# Patient Record
Sex: Male | Born: 1970 | Race: White | Hispanic: No | Marital: Married | Smoking: Current every day smoker
Health system: Southern US, Community
[De-identification: ages and names within clinical notes are randomized; demographics above are authoritative.]

## PROBLEM LIST (undated history)

## (undated) DIAGNOSIS — E785 Hyperlipidemia, unspecified: Secondary | ICD-10-CM

## (undated) DIAGNOSIS — G8929 Other chronic pain: Secondary | ICD-10-CM

## (undated) DIAGNOSIS — M1990 Unspecified osteoarthritis, unspecified site: Secondary | ICD-10-CM

## (undated) DIAGNOSIS — I4892 Unspecified atrial flutter: Secondary | ICD-10-CM

## (undated) DIAGNOSIS — Z720 Tobacco use: Secondary | ICD-10-CM

## (undated) DIAGNOSIS — D8685 Sarcoid myocarditis: Secondary | ICD-10-CM

## (undated) DIAGNOSIS — Z950 Presence of cardiac pacemaker: Secondary | ICD-10-CM

## (undated) DIAGNOSIS — E669 Obesity, unspecified: Secondary | ICD-10-CM

## (undated) DIAGNOSIS — I10 Essential (primary) hypertension: Secondary | ICD-10-CM

## (undated) DIAGNOSIS — G4733 Obstructive sleep apnea (adult) (pediatric): Secondary | ICD-10-CM

## (undated) DIAGNOSIS — I872 Venous insufficiency (chronic) (peripheral): Secondary | ICD-10-CM

## (undated) DIAGNOSIS — F1010 Alcohol abuse, uncomplicated: Secondary | ICD-10-CM

## (undated) DIAGNOSIS — G629 Polyneuropathy, unspecified: Secondary | ICD-10-CM

## (undated) DIAGNOSIS — D6859 Other primary thrombophilia: Secondary | ICD-10-CM

## (undated) DIAGNOSIS — I499 Cardiac arrhythmia, unspecified: Secondary | ICD-10-CM

## (undated) DIAGNOSIS — E119 Type 2 diabetes mellitus without complications: Secondary | ICD-10-CM

## (undated) HISTORY — DX: Sarcoid myocarditis: D86.85

## (undated) HISTORY — DX: Hyperlipidemia, unspecified: E78.5

## (undated) HISTORY — DX: Obstructive sleep apnea (adult) (pediatric): G47.33

## (undated) HISTORY — DX: Unspecified osteoarthritis, unspecified site: M19.90

## (undated) HISTORY — DX: Tobacco use: Z72.0

## (undated) HISTORY — DX: Obesity, unspecified: E66.9

## (undated) HISTORY — DX: Polyneuropathy, unspecified: G62.9

## (undated) HISTORY — PX: CARPAL TUNNEL RELEASE: SHX101

## (undated) HISTORY — DX: Other chronic pain: G89.29

## (undated) HISTORY — DX: Type 2 diabetes mellitus without complications: E11.9

## (undated) HISTORY — DX: Venous insufficiency (chronic) (peripheral): I87.2

## (undated) HISTORY — DX: Unspecified atrial flutter: I48.92

## (undated) HISTORY — DX: Alcohol abuse, uncomplicated: F10.10

---

## 2005-07-29 ENCOUNTER — Ambulatory Visit (HOSPITAL_BASED_OUTPATIENT_CLINIC_OR_DEPARTMENT_OTHER): Admission: RE | Admit: 2005-07-29 | Discharge: 2005-07-29 | Payer: Self-pay | Admitting: Orthopedic Surgery

## 2007-04-11 ENCOUNTER — Ambulatory Visit (HOSPITAL_COMMUNITY): Admission: RE | Admit: 2007-04-11 | Discharge: 2007-04-11 | Payer: Self-pay | Admitting: Orthopedic Surgery

## 2010-10-31 NOTE — Op Note (Signed)
NAMEFARRON, Christopher Green NO.:  [id number]   MEDICAL RECORD NO.:  [id number]          PATIENT TYPE:  AMB   LOCATION:  DSC                          FACILITY:  [facility]   PHYSICIAN:  Cindee Salt, M.D.       DATE OF BIRTH:  1970/10/23   DATE OF PROCEDURE:  07/29/2005  DATE OF DISCHARGE:                                 OPERATIVE REPORT   Please see snapping no Maxamilian Amadon feel any [facility] [phone]   PREOPERATIVE DIAGNOSIS:  Carpal tunnel syndrome left hand.   POSTOPERATIVE DIAGNOSIS:  Carpal tunnel syndrome left hand.   OPERATION:  Decompression left median nerve.   SURGEON:  Cindee Salt, M.D.   ASSISTANT:  Joaquin Courts R.N.   ANESTHESIA:  Forearm based IV regional.   HISTORY:  The patient is a 40 year old male with a history of carpal tunnel  syndrome. EMG nerve conduction is positive which has not responded to  conservative treatment.   PROCEDURE:  The patient was brought to the operating room where a forearm  based IV regional anesthetic was carried out without difficulty; was prepped  using DuraPrep, supine position, left arm free. A longitudinal incision was  made and carried down through subcutaneous tissue. Bleeders were  electrocauterized. Palmar fascia was split. Superficial palmar arch  identified. Flexor tendon of the ring and little finger identified to the  ulnar side of the median nerve  The carpal retinaculum was incised with  sharp dissection. Right-angle and Sewall retractor were placed between skin  and forearm fascia. The fascia was released for approximately 1.5 cm  proximal to the wrist crease under direct vision. Palmar fascia was markedly  thickened. Tenosynovial tissue was also thickened. Area of deformity to the  nerve was apparent with an area of hyperemia. No further lesions were  identified. The wound was irrigated. The skin was closed with interrupted 5-  0 nylon sutures. Sterile compressive dressing and splint was applied. The  patient tolerated the procedure well, and was taken to the recovery room for  observation in satisfactory condition. He was discharged home to return to  the [facility] [place] in 1 week on Vicodin.           ______________________________  Cindee Salt, M.D.     GK/MEDQ  D:  07/29/2005  T:  07/29/2005  Job:  [id number]

## 2017-04-20 DIAGNOSIS — I10 Essential (primary) hypertension: Secondary | ICD-10-CM | POA: Diagnosis present

## 2017-06-15 HISTORY — PX: CARDIAC CATHETERIZATION: SHX172

## 2018-02-13 DIAGNOSIS — R748 Abnormal levels of other serum enzymes: Secondary | ICD-10-CM | POA: Diagnosis not present

## 2018-02-13 DIAGNOSIS — I10 Essential (primary) hypertension: Secondary | ICD-10-CM

## 2018-02-14 DIAGNOSIS — R079 Chest pain, unspecified: Secondary | ICD-10-CM

## 2018-02-14 DIAGNOSIS — I10 Essential (primary) hypertension: Secondary | ICD-10-CM | POA: Diagnosis not present

## 2018-02-14 DIAGNOSIS — R748 Abnormal levels of other serum enzymes: Secondary | ICD-10-CM | POA: Diagnosis not present

## 2018-02-15 DIAGNOSIS — F419 Anxiety disorder, unspecified: Secondary | ICD-10-CM | POA: Diagnosis not present

## 2018-02-15 DIAGNOSIS — R748 Abnormal levels of other serum enzymes: Secondary | ICD-10-CM | POA: Diagnosis not present

## 2018-02-15 DIAGNOSIS — I10 Essential (primary) hypertension: Secondary | ICD-10-CM | POA: Diagnosis not present

## 2018-02-28 DIAGNOSIS — Z720 Tobacco use: Secondary | ICD-10-CM | POA: Diagnosis present

## 2018-03-25 HISTORY — PX: CARDIAC CATHETERIZATION: SHX172

## 2019-11-15 ENCOUNTER — Other Ambulatory Visit: Payer: Self-pay | Admitting: Orthopaedic Surgery

## 2019-11-15 DIAGNOSIS — M4716 Other spondylosis with myelopathy, lumbar region: Secondary | ICD-10-CM

## 2019-12-19 ENCOUNTER — Ambulatory Visit
Admission: RE | Admit: 2019-12-19 | Discharge: 2019-12-19 | Disposition: A | Discharge status: Home / Self Care | Payer: BC Managed Care – PPO | Source: Ambulatory Visit | Attending: Orthopaedic Surgery | Admitting: Orthopaedic Surgery

## 2019-12-19 DIAGNOSIS — M4716 Other spondylosis with myelopathy, lumbar region: Secondary | ICD-10-CM

## 2021-04-23 DIAGNOSIS — R200 Anesthesia of skin: Secondary | ICD-10-CM | POA: Diagnosis not present

## 2021-04-23 DIAGNOSIS — M19041 Primary osteoarthritis, right hand: Secondary | ICD-10-CM | POA: Diagnosis not present

## 2021-04-23 DIAGNOSIS — R202 Paresthesia of skin: Secondary | ICD-10-CM | POA: Diagnosis not present

## 2021-04-30 DIAGNOSIS — M4807 Spinal stenosis, lumbosacral region: Secondary | ICD-10-CM | POA: Diagnosis not present

## 2021-04-30 DIAGNOSIS — G5613 Other lesions of median nerve, bilateral upper limbs: Secondary | ICD-10-CM | POA: Diagnosis not present

## 2021-04-30 DIAGNOSIS — F17210 Nicotine dependence, cigarettes, uncomplicated: Secondary | ICD-10-CM | POA: Diagnosis not present

## 2021-04-30 DIAGNOSIS — R0602 Shortness of breath: Secondary | ICD-10-CM | POA: Diagnosis not present

## 2021-04-30 DIAGNOSIS — E291 Testicular hypofunction: Secondary | ICD-10-CM | POA: Diagnosis not present

## 2021-04-30 DIAGNOSIS — M542 Cervicalgia: Secondary | ICD-10-CM | POA: Diagnosis not present

## 2021-04-30 DIAGNOSIS — F17200 Nicotine dependence, unspecified, uncomplicated: Secondary | ICD-10-CM | POA: Diagnosis not present

## 2021-04-30 DIAGNOSIS — G8929 Other chronic pain: Secondary | ICD-10-CM | POA: Diagnosis not present

## 2021-04-30 DIAGNOSIS — M5412 Radiculopathy, cervical region: Secondary | ICD-10-CM | POA: Diagnosis not present

## 2021-04-30 DIAGNOSIS — M5137 Other intervertebral disc degeneration, lumbosacral region: Secondary | ICD-10-CM | POA: Diagnosis not present

## 2021-04-30 DIAGNOSIS — I10 Essential (primary) hypertension: Secondary | ICD-10-CM | POA: Diagnosis not present

## 2021-05-16 DIAGNOSIS — M546 Pain in thoracic spine: Secondary | ICD-10-CM | POA: Diagnosis not present

## 2021-05-16 DIAGNOSIS — M545 Low back pain, unspecified: Secondary | ICD-10-CM | POA: Diagnosis not present

## 2021-05-16 DIAGNOSIS — M79672 Pain in left foot: Secondary | ICD-10-CM | POA: Diagnosis not present

## 2021-05-16 DIAGNOSIS — F17210 Nicotine dependence, cigarettes, uncomplicated: Secondary | ICD-10-CM | POA: Diagnosis not present

## 2021-05-16 DIAGNOSIS — M542 Cervicalgia: Secondary | ICD-10-CM | POA: Diagnosis not present

## 2021-05-16 DIAGNOSIS — M79671 Pain in right foot: Secondary | ICD-10-CM | POA: Diagnosis not present

## 2021-05-16 DIAGNOSIS — G8929 Other chronic pain: Secondary | ICD-10-CM | POA: Diagnosis not present

## 2021-05-16 DIAGNOSIS — Z79899 Other long term (current) drug therapy: Secondary | ICD-10-CM | POA: Diagnosis not present

## 2021-05-16 DIAGNOSIS — M129 Arthropathy, unspecified: Secondary | ICD-10-CM | POA: Diagnosis not present

## 2021-05-16 DIAGNOSIS — R5383 Other fatigue: Secondary | ICD-10-CM | POA: Diagnosis not present

## 2021-05-16 DIAGNOSIS — E1165 Type 2 diabetes mellitus with hyperglycemia: Secondary | ICD-10-CM | POA: Diagnosis not present

## 2021-05-20 DIAGNOSIS — Z79899 Other long term (current) drug therapy: Secondary | ICD-10-CM | POA: Diagnosis not present

## 2021-05-28 ENCOUNTER — Other Ambulatory Visit: Payer: Self-pay

## 2021-05-28 ENCOUNTER — Ambulatory Visit (INDEPENDENT_AMBULATORY_CARE_PROVIDER_SITE_OTHER): Payer: BC Managed Care – PPO

## 2021-05-28 ENCOUNTER — Ambulatory Visit (INDEPENDENT_AMBULATORY_CARE_PROVIDER_SITE_OTHER): Payer: BC Managed Care – PPO | Admitting: Podiatry

## 2021-05-28 DIAGNOSIS — G629 Polyneuropathy, unspecified: Secondary | ICD-10-CM | POA: Diagnosis not present

## 2021-05-28 DIAGNOSIS — M779 Enthesopathy, unspecified: Secondary | ICD-10-CM

## 2021-05-28 MED ORDER — GABAPENTIN 400 MG PO CAPS: 400.0000 mg | ORAL_CAPSULE | Freq: Three times a day (TID) | ORAL | 3 refills | Status: DC

## 2021-05-29 NOTE — Progress Notes (Signed)
Subjective:   Patient ID: Christopher Green, male   DOB: 50 y.o.   MRN: [id number]   HPI Patient presents stating he has a lot of generalized pain in both of his feet and cannot make a complete determination stating its been going on for a long time and also has back issues and is due to see a surgeon for nerve compressions and also neck.  Patient does not smoke moderately obese and does work 12 to 14 hours a day standing   Review of Systems  All other systems reviewed and are negative.      Objective:  Physical Exam Vitals and nursing note reviewed.  Constitutional:      Appearance: He is well-developed.  Pulmonary:     Effort: Pulmonary effort is normal.  Musculoskeletal:        General: Normal range of motion.  Skin:    General: Skin is warm.  Neurological:     Mental Status: He is alert.    Vascular status was intact with patient found to have moderate diminishment sharp dull vibratory.  Range of motion present muscle strength adequate with patient not having any specific areas on his feet that I was able to identify pain but states they simply just are painful with walking and at nighttime.  He also has severe back issues and is seeking care currently for this     Assessment:  Probability that his back is a big part of the problem that he is experiencing with obesity is also a complicating factor in his having to be on his feet for such extended hours     Plan:  H&P x-rays reviewed conditions discussed at great length.  At this point I do think that we should try to treat the probable neuropathic element of his condition and we will get a start him on gabapentin 1 at night and if he tolerates it well he can add 1 morning midday.  I do not expect a miracle cures for him but I am hoping this will help and if he cannot tolerate it he is not to keep taking the medicine.  He is due to see a neurosurgeon which I think to be of benefit but I do not know whether or not this will cure his  foot pain but there is no organic issue that I saw at the current time  X-rays bilateral were negative for signs of arthritis negative for signs of fracture or acute condition associated with the pain he experiences in both feet

## 2021-06-12 DIAGNOSIS — F17210 Nicotine dependence, cigarettes, uncomplicated: Secondary | ICD-10-CM | POA: Diagnosis not present

## 2021-06-12 DIAGNOSIS — B9689 Other specified bacterial agents as the cause of diseases classified elsewhere: Secondary | ICD-10-CM | POA: Diagnosis not present

## 2021-06-12 DIAGNOSIS — J329 Chronic sinusitis, unspecified: Secondary | ICD-10-CM | POA: Diagnosis not present

## 2021-06-12 DIAGNOSIS — M542 Cervicalgia: Secondary | ICD-10-CM | POA: Diagnosis not present

## 2021-06-12 DIAGNOSIS — G8929 Other chronic pain: Secondary | ICD-10-CM | POA: Diagnosis not present

## 2021-06-12 DIAGNOSIS — Z79899 Other long term (current) drug therapy: Secondary | ICD-10-CM | POA: Diagnosis not present

## 2021-06-17 DIAGNOSIS — Z79899 Other long term (current) drug therapy: Secondary | ICD-10-CM | POA: Diagnosis not present

## 2021-07-11 DIAGNOSIS — Z6835 Body mass index (BMI) 35.0-35.9, adult: Secondary | ICD-10-CM | POA: Diagnosis not present

## 2021-07-11 DIAGNOSIS — E1165 Type 2 diabetes mellitus with hyperglycemia: Secondary | ICD-10-CM | POA: Diagnosis not present

## 2021-07-11 DIAGNOSIS — G8929 Other chronic pain: Secondary | ICD-10-CM | POA: Diagnosis not present

## 2021-07-11 DIAGNOSIS — Z0000 Encounter for general adult medical examination without abnormal findings: Secondary | ICD-10-CM | POA: Diagnosis not present

## 2021-07-11 DIAGNOSIS — Z79899 Other long term (current) drug therapy: Secondary | ICD-10-CM | POA: Diagnosis not present

## 2021-07-11 DIAGNOSIS — Z125 Encounter for screening for malignant neoplasm of prostate: Secondary | ICD-10-CM | POA: Diagnosis not present

## 2021-07-11 DIAGNOSIS — M542 Cervicalgia: Secondary | ICD-10-CM | POA: Diagnosis not present

## 2021-07-15 DIAGNOSIS — Z79899 Other long term (current) drug therapy: Secondary | ICD-10-CM | POA: Diagnosis not present

## 2021-08-08 DIAGNOSIS — M5136 Other intervertebral disc degeneration, lumbar region: Secondary | ICD-10-CM | POA: Diagnosis not present

## 2021-08-08 DIAGNOSIS — Z79899 Other long term (current) drug therapy: Secondary | ICD-10-CM | POA: Diagnosis not present

## 2021-08-08 DIAGNOSIS — T148XXA Other injury of unspecified body region, initial encounter: Secondary | ICD-10-CM | POA: Diagnosis not present

## 2021-08-08 DIAGNOSIS — E1165 Type 2 diabetes mellitus with hyperglycemia: Secondary | ICD-10-CM | POA: Diagnosis not present

## 2021-08-08 DIAGNOSIS — R030 Elevated blood-pressure reading, without diagnosis of hypertension: Secondary | ICD-10-CM | POA: Diagnosis not present

## 2021-08-12 DIAGNOSIS — M5412 Radiculopathy, cervical region: Secondary | ICD-10-CM | POA: Diagnosis not present

## 2021-08-12 DIAGNOSIS — M48062 Spinal stenosis, lumbar region with neurogenic claudication: Secondary | ICD-10-CM | POA: Diagnosis not present

## 2021-08-12 DIAGNOSIS — M542 Cervicalgia: Secondary | ICD-10-CM | POA: Diagnosis not present

## 2021-08-12 DIAGNOSIS — M5416 Radiculopathy, lumbar region: Secondary | ICD-10-CM | POA: Diagnosis not present

## 2021-08-13 ENCOUNTER — Other Ambulatory Visit: Payer: Self-pay | Admitting: Rehabilitation

## 2021-08-13 DIAGNOSIS — M5412 Radiculopathy, cervical region: Secondary | ICD-10-CM

## 2021-08-19 DIAGNOSIS — G8929 Other chronic pain: Secondary | ICD-10-CM | POA: Diagnosis not present

## 2021-08-19 DIAGNOSIS — E1165 Type 2 diabetes mellitus with hyperglycemia: Secondary | ICD-10-CM | POA: Diagnosis not present

## 2021-08-19 DIAGNOSIS — Z6835 Body mass index (BMI) 35.0-35.9, adult: Secondary | ICD-10-CM | POA: Diagnosis not present

## 2021-08-19 DIAGNOSIS — R030 Elevated blood-pressure reading, without diagnosis of hypertension: Secondary | ICD-10-CM | POA: Diagnosis not present

## 2021-08-19 DIAGNOSIS — M542 Cervicalgia: Secondary | ICD-10-CM | POA: Diagnosis not present

## 2021-08-24 ENCOUNTER — Inpatient Hospital Stay: Admission: RE | Admit: 2021-08-24 | Payer: BC Managed Care – PPO | Source: Ambulatory Visit

## 2021-08-26 ENCOUNTER — Ambulatory Visit
Admission: RE | Admit: 2021-08-26 | Discharge: 2021-08-26 | Disposition: A | Discharge status: Home / Self Care | Payer: BC Managed Care – PPO | Source: Ambulatory Visit | Attending: Rehabilitation | Admitting: Rehabilitation

## 2021-08-26 ENCOUNTER — Other Ambulatory Visit: Payer: Self-pay

## 2021-08-26 DIAGNOSIS — M5412 Radiculopathy, cervical region: Secondary | ICD-10-CM

## 2021-08-26 DIAGNOSIS — M2578 Osteophyte, vertebrae: Secondary | ICD-10-CM | POA: Diagnosis not present

## 2021-08-26 DIAGNOSIS — M4802 Spinal stenosis, cervical region: Secondary | ICD-10-CM | POA: Diagnosis not present

## 2021-08-27 DIAGNOSIS — M48062 Spinal stenosis, lumbar region with neurogenic claudication: Secondary | ICD-10-CM | POA: Diagnosis not present

## 2021-08-27 DIAGNOSIS — M5416 Radiculopathy, lumbar region: Secondary | ICD-10-CM | POA: Diagnosis not present

## 2021-08-27 DIAGNOSIS — M5412 Radiculopathy, cervical region: Secondary | ICD-10-CM | POA: Diagnosis not present

## 2021-09-01 ENCOUNTER — Other Ambulatory Visit: Payer: BC Managed Care – PPO

## 2021-09-03 DIAGNOSIS — M5412 Radiculopathy, cervical region: Secondary | ICD-10-CM | POA: Diagnosis not present

## 2021-09-05 DIAGNOSIS — Z79899 Other long term (current) drug therapy: Secondary | ICD-10-CM | POA: Diagnosis not present

## 2021-09-05 DIAGNOSIS — M542 Cervicalgia: Secondary | ICD-10-CM | POA: Diagnosis not present

## 2021-09-05 DIAGNOSIS — F40243 Fear of flying: Secondary | ICD-10-CM | POA: Diagnosis not present

## 2021-09-05 DIAGNOSIS — G8929 Other chronic pain: Secondary | ICD-10-CM | POA: Diagnosis not present

## 2021-09-05 DIAGNOSIS — E1165 Type 2 diabetes mellitus with hyperglycemia: Secondary | ICD-10-CM | POA: Diagnosis not present

## 2021-09-09 DIAGNOSIS — Z79899 Other long term (current) drug therapy: Secondary | ICD-10-CM | POA: Diagnosis not present

## 2021-10-03 DIAGNOSIS — M542 Cervicalgia: Secondary | ICD-10-CM | POA: Diagnosis not present

## 2021-10-03 DIAGNOSIS — G8929 Other chronic pain: Secondary | ICD-10-CM | POA: Diagnosis not present

## 2021-10-03 DIAGNOSIS — R030 Elevated blood-pressure reading, without diagnosis of hypertension: Secondary | ICD-10-CM | POA: Diagnosis not present

## 2021-10-03 DIAGNOSIS — Z6835 Body mass index (BMI) 35.0-35.9, adult: Secondary | ICD-10-CM | POA: Diagnosis not present

## 2021-10-03 DIAGNOSIS — Z79899 Other long term (current) drug therapy: Secondary | ICD-10-CM | POA: Diagnosis not present

## 2021-10-03 DIAGNOSIS — F419 Anxiety disorder, unspecified: Secondary | ICD-10-CM | POA: Diagnosis not present

## 2021-10-03 DIAGNOSIS — E1165 Type 2 diabetes mellitus with hyperglycemia: Secondary | ICD-10-CM | POA: Diagnosis not present

## 2021-10-06 DIAGNOSIS — Z79899 Other long term (current) drug therapy: Secondary | ICD-10-CM | POA: Diagnosis not present

## 2021-10-17 DIAGNOSIS — M542 Cervicalgia: Secondary | ICD-10-CM | POA: Diagnosis not present

## 2021-10-17 DIAGNOSIS — G8929 Other chronic pain: Secondary | ICD-10-CM | POA: Diagnosis not present

## 2021-10-17 DIAGNOSIS — R030 Elevated blood-pressure reading, without diagnosis of hypertension: Secondary | ICD-10-CM | POA: Diagnosis not present

## 2021-10-17 DIAGNOSIS — Z6835 Body mass index (BMI) 35.0-35.9, adult: Secondary | ICD-10-CM | POA: Diagnosis not present

## 2021-10-17 DIAGNOSIS — Z6837 Body mass index (BMI) 37.0-37.9, adult: Secondary | ICD-10-CM | POA: Diagnosis not present

## 2021-10-17 DIAGNOSIS — E1165 Type 2 diabetes mellitus with hyperglycemia: Secondary | ICD-10-CM | POA: Diagnosis not present

## 2021-10-17 DIAGNOSIS — F419 Anxiety disorder, unspecified: Secondary | ICD-10-CM | POA: Diagnosis not present

## 2021-10-17 DIAGNOSIS — Z79899 Other long term (current) drug therapy: Secondary | ICD-10-CM | POA: Diagnosis not present

## 2021-10-21 DIAGNOSIS — Z79899 Other long term (current) drug therapy: Secondary | ICD-10-CM | POA: Diagnosis not present

## 2021-10-31 DIAGNOSIS — M542 Cervicalgia: Secondary | ICD-10-CM | POA: Diagnosis not present

## 2021-10-31 DIAGNOSIS — E1165 Type 2 diabetes mellitus with hyperglycemia: Secondary | ICD-10-CM | POA: Diagnosis not present

## 2021-10-31 DIAGNOSIS — G8929 Other chronic pain: Secondary | ICD-10-CM | POA: Diagnosis not present

## 2021-10-31 DIAGNOSIS — Z6837 Body mass index (BMI) 37.0-37.9, adult: Secondary | ICD-10-CM | POA: Diagnosis not present

## 2021-10-31 DIAGNOSIS — Z6835 Body mass index (BMI) 35.0-35.9, adult: Secondary | ICD-10-CM | POA: Diagnosis not present

## 2021-10-31 DIAGNOSIS — F419 Anxiety disorder, unspecified: Secondary | ICD-10-CM | POA: Diagnosis not present

## 2021-10-31 DIAGNOSIS — R030 Elevated blood-pressure reading, without diagnosis of hypertension: Secondary | ICD-10-CM | POA: Diagnosis not present

## 2021-10-31 DIAGNOSIS — Z79899 Other long term (current) drug therapy: Secondary | ICD-10-CM | POA: Diagnosis not present

## 2021-11-05 DIAGNOSIS — Z79899 Other long term (current) drug therapy: Secondary | ICD-10-CM | POA: Diagnosis not present

## 2021-12-04 DIAGNOSIS — F419 Anxiety disorder, unspecified: Secondary | ICD-10-CM | POA: Diagnosis not present

## 2021-12-04 DIAGNOSIS — Z6837 Body mass index (BMI) 37.0-37.9, adult: Secondary | ICD-10-CM | POA: Diagnosis not present

## 2021-12-04 DIAGNOSIS — Z79899 Other long term (current) drug therapy: Secondary | ICD-10-CM | POA: Diagnosis not present

## 2021-12-04 DIAGNOSIS — R030 Elevated blood-pressure reading, without diagnosis of hypertension: Secondary | ICD-10-CM | POA: Diagnosis not present

## 2021-12-04 DIAGNOSIS — E1165 Type 2 diabetes mellitus with hyperglycemia: Secondary | ICD-10-CM | POA: Diagnosis not present

## 2021-12-04 DIAGNOSIS — M542 Cervicalgia: Secondary | ICD-10-CM | POA: Diagnosis not present

## 2021-12-04 DIAGNOSIS — G8929 Other chronic pain: Secondary | ICD-10-CM | POA: Diagnosis not present

## 2021-12-08 DIAGNOSIS — Z79899 Other long term (current) drug therapy: Secondary | ICD-10-CM | POA: Diagnosis not present

## 2022-01-01 DIAGNOSIS — F419 Anxiety disorder, unspecified: Secondary | ICD-10-CM | POA: Diagnosis not present

## 2022-01-01 DIAGNOSIS — E1165 Type 2 diabetes mellitus with hyperglycemia: Secondary | ICD-10-CM | POA: Diagnosis not present

## 2022-01-01 DIAGNOSIS — Z6836 Body mass index (BMI) 36.0-36.9, adult: Secondary | ICD-10-CM | POA: Diagnosis not present

## 2022-01-01 DIAGNOSIS — M542 Cervicalgia: Secondary | ICD-10-CM | POA: Diagnosis not present

## 2022-01-01 DIAGNOSIS — Z79899 Other long term (current) drug therapy: Secondary | ICD-10-CM | POA: Diagnosis not present

## 2022-01-01 DIAGNOSIS — R030 Elevated blood-pressure reading, without diagnosis of hypertension: Secondary | ICD-10-CM | POA: Diagnosis not present

## 2022-01-01 DIAGNOSIS — G8929 Other chronic pain: Secondary | ICD-10-CM | POA: Diagnosis not present

## 2022-01-05 DIAGNOSIS — Z79899 Other long term (current) drug therapy: Secondary | ICD-10-CM | POA: Diagnosis not present

## 2022-01-30 DIAGNOSIS — Z79899 Other long term (current) drug therapy: Secondary | ICD-10-CM | POA: Diagnosis not present

## 2022-01-30 DIAGNOSIS — G8929 Other chronic pain: Secondary | ICD-10-CM | POA: Diagnosis not present

## 2022-01-30 DIAGNOSIS — R030 Elevated blood-pressure reading, without diagnosis of hypertension: Secondary | ICD-10-CM | POA: Diagnosis not present

## 2022-01-30 DIAGNOSIS — F419 Anxiety disorder, unspecified: Secondary | ICD-10-CM | POA: Diagnosis not present

## 2022-01-30 DIAGNOSIS — Z6836 Body mass index (BMI) 36.0-36.9, adult: Secondary | ICD-10-CM | POA: Diagnosis not present

## 2022-01-30 DIAGNOSIS — E1165 Type 2 diabetes mellitus with hyperglycemia: Secondary | ICD-10-CM | POA: Diagnosis not present

## 2022-01-30 DIAGNOSIS — M542 Cervicalgia: Secondary | ICD-10-CM | POA: Diagnosis not present

## 2022-02-03 DIAGNOSIS — Z79899 Other long term (current) drug therapy: Secondary | ICD-10-CM | POA: Diagnosis not present

## 2022-03-02 DIAGNOSIS — Z79899 Other long term (current) drug therapy: Secondary | ICD-10-CM | POA: Diagnosis not present

## 2022-03-02 DIAGNOSIS — F419 Anxiety disorder, unspecified: Secondary | ICD-10-CM | POA: Diagnosis not present

## 2022-03-02 DIAGNOSIS — R030 Elevated blood-pressure reading, without diagnosis of hypertension: Secondary | ICD-10-CM | POA: Diagnosis not present

## 2022-03-02 DIAGNOSIS — G8929 Other chronic pain: Secondary | ICD-10-CM | POA: Diagnosis not present

## 2022-03-02 DIAGNOSIS — M542 Cervicalgia: Secondary | ICD-10-CM | POA: Diagnosis not present

## 2022-03-02 DIAGNOSIS — Z6837 Body mass index (BMI) 37.0-37.9, adult: Secondary | ICD-10-CM | POA: Diagnosis not present

## 2022-03-02 DIAGNOSIS — E1165 Type 2 diabetes mellitus with hyperglycemia: Secondary | ICD-10-CM | POA: Diagnosis not present

## 2022-03-05 DIAGNOSIS — Z79899 Other long term (current) drug therapy: Secondary | ICD-10-CM | POA: Diagnosis not present

## 2022-03-16 DIAGNOSIS — E291 Testicular hypofunction: Secondary | ICD-10-CM | POA: Diagnosis not present

## 2022-03-16 DIAGNOSIS — I10 Essential (primary) hypertension: Secondary | ICD-10-CM | POA: Diagnosis not present

## 2022-03-16 DIAGNOSIS — E1165 Type 2 diabetes mellitus with hyperglycemia: Secondary | ICD-10-CM | POA: Diagnosis not present

## 2022-03-16 DIAGNOSIS — Z6838 Body mass index (BMI) 38.0-38.9, adult: Secondary | ICD-10-CM | POA: Diagnosis not present

## 2022-03-16 DIAGNOSIS — Z0000 Encounter for general adult medical examination without abnormal findings: Secondary | ICD-10-CM | POA: Diagnosis not present

## 2022-03-16 DIAGNOSIS — J018 Other acute sinusitis: Secondary | ICD-10-CM | POA: Diagnosis not present

## 2022-04-03 DIAGNOSIS — M542 Cervicalgia: Secondary | ICD-10-CM | POA: Diagnosis not present

## 2022-04-03 DIAGNOSIS — G8929 Other chronic pain: Secondary | ICD-10-CM | POA: Diagnosis not present

## 2022-04-03 DIAGNOSIS — R7309 Other abnormal glucose: Secondary | ICD-10-CM | POA: Diagnosis not present

## 2022-04-03 DIAGNOSIS — E1165 Type 2 diabetes mellitus with hyperglycemia: Secondary | ICD-10-CM | POA: Diagnosis not present

## 2022-04-03 DIAGNOSIS — F419 Anxiety disorder, unspecified: Secondary | ICD-10-CM | POA: Diagnosis not present

## 2022-04-03 DIAGNOSIS — Z79899 Other long term (current) drug therapy: Secondary | ICD-10-CM | POA: Diagnosis not present

## 2022-04-03 DIAGNOSIS — Z6839 Body mass index (BMI) 39.0-39.9, adult: Secondary | ICD-10-CM | POA: Diagnosis not present

## 2022-04-03 DIAGNOSIS — R030 Elevated blood-pressure reading, without diagnosis of hypertension: Secondary | ICD-10-CM | POA: Diagnosis not present

## 2022-04-09 DIAGNOSIS — Z79899 Other long term (current) drug therapy: Secondary | ICD-10-CM | POA: Diagnosis not present

## 2022-05-01 DIAGNOSIS — Z79899 Other long term (current) drug therapy: Secondary | ICD-10-CM | POA: Diagnosis not present

## 2022-05-01 DIAGNOSIS — R030 Elevated blood-pressure reading, without diagnosis of hypertension: Secondary | ICD-10-CM | POA: Diagnosis not present

## 2022-05-01 DIAGNOSIS — F419 Anxiety disorder, unspecified: Secondary | ICD-10-CM | POA: Diagnosis not present

## 2022-05-01 DIAGNOSIS — G8929 Other chronic pain: Secondary | ICD-10-CM | POA: Diagnosis not present

## 2022-05-01 DIAGNOSIS — E1165 Type 2 diabetes mellitus with hyperglycemia: Secondary | ICD-10-CM | POA: Diagnosis not present

## 2022-05-01 DIAGNOSIS — M542 Cervicalgia: Secondary | ICD-10-CM | POA: Diagnosis not present

## 2022-05-01 DIAGNOSIS — Z6838 Body mass index (BMI) 38.0-38.9, adult: Secondary | ICD-10-CM | POA: Diagnosis not present

## 2022-05-08 DIAGNOSIS — Z79899 Other long term (current) drug therapy: Secondary | ICD-10-CM | POA: Diagnosis not present

## 2022-05-25 ENCOUNTER — Emergency Department (HOSPITAL_COMMUNITY): Payer: BC Managed Care – PPO

## 2022-05-25 ENCOUNTER — Inpatient Hospital Stay (HOSPITAL_COMMUNITY)
Admission: EM | Admit: 2022-05-25 | Discharge: 2022-05-28 | DRG: 176 | Disposition: A | Discharge status: Home / Self Care | Payer: BC Managed Care – PPO | Attending: Internal Medicine | Admitting: Internal Medicine

## 2022-05-25 ENCOUNTER — Other Ambulatory Visit: Payer: Self-pay

## 2022-05-25 ENCOUNTER — Encounter (HOSPITAL_COMMUNITY): Payer: Self-pay

## 2022-05-25 DIAGNOSIS — N400 Enlarged prostate without lower urinary tract symptoms: Secondary | ICD-10-CM | POA: Diagnosis not present

## 2022-05-25 DIAGNOSIS — I441 Atrioventricular block, second degree: Secondary | ICD-10-CM | POA: Diagnosis present

## 2022-05-25 DIAGNOSIS — E1165 Type 2 diabetes mellitus with hyperglycemia: Secondary | ICD-10-CM | POA: Diagnosis not present

## 2022-05-25 DIAGNOSIS — R079 Chest pain, unspecified: Secondary | ICD-10-CM | POA: Diagnosis not present

## 2022-05-25 DIAGNOSIS — K4090 Unilateral inguinal hernia, without obstruction or gangrene, not specified as recurrent: Secondary | ICD-10-CM | POA: Diagnosis not present

## 2022-05-25 DIAGNOSIS — I517 Cardiomegaly: Secondary | ICD-10-CM | POA: Diagnosis not present

## 2022-05-25 DIAGNOSIS — Z7984 Long term (current) use of oral hypoglycemic drugs: Secondary | ICD-10-CM | POA: Diagnosis not present

## 2022-05-25 DIAGNOSIS — I82442 Acute embolism and thrombosis of left tibial vein: Secondary | ICD-10-CM | POA: Diagnosis present

## 2022-05-25 DIAGNOSIS — I2510 Atherosclerotic heart disease of native coronary artery without angina pectoris: Secondary | ICD-10-CM | POA: Diagnosis present

## 2022-05-25 DIAGNOSIS — I161 Hypertensive emergency: Secondary | ICD-10-CM | POA: Diagnosis not present

## 2022-05-25 DIAGNOSIS — I82452 Acute embolism and thrombosis of left peroneal vein: Secondary | ICD-10-CM | POA: Diagnosis not present

## 2022-05-25 DIAGNOSIS — E876 Hypokalemia: Secondary | ICD-10-CM | POA: Diagnosis not present

## 2022-05-25 DIAGNOSIS — E785 Hyperlipidemia, unspecified: Secondary | ICD-10-CM | POA: Diagnosis not present

## 2022-05-25 DIAGNOSIS — Z8249 Family history of ischemic heart disease and other diseases of the circulatory system: Secondary | ICD-10-CM

## 2022-05-25 DIAGNOSIS — Z79899 Other long term (current) drug therapy: Secondary | ICD-10-CM

## 2022-05-25 DIAGNOSIS — R0602 Shortness of breath: Secondary | ICD-10-CM | POA: Diagnosis not present

## 2022-05-25 DIAGNOSIS — F17210 Nicotine dependence, cigarettes, uncomplicated: Secondary | ICD-10-CM | POA: Diagnosis present

## 2022-05-25 DIAGNOSIS — Z20822 Contact with and (suspected) exposure to covid-19: Secondary | ICD-10-CM | POA: Diagnosis present

## 2022-05-25 DIAGNOSIS — I700 Atherosclerosis of aorta: Secondary | ICD-10-CM | POA: Diagnosis not present

## 2022-05-25 DIAGNOSIS — I2694 Multiple subsegmental pulmonary emboli without acute cor pulmonale: Secondary | ICD-10-CM | POA: Diagnosis not present

## 2022-05-25 DIAGNOSIS — R778 Other specified abnormalities of plasma proteins: Secondary | ICD-10-CM | POA: Diagnosis not present

## 2022-05-25 DIAGNOSIS — I2699 Other pulmonary embolism without acute cor pulmonale: Secondary | ICD-10-CM | POA: Diagnosis present

## 2022-05-25 DIAGNOSIS — I3139 Other pericardial effusion (noninflammatory): Secondary | ICD-10-CM | POA: Diagnosis present

## 2022-05-25 DIAGNOSIS — I160 Hypertensive urgency: Secondary | ICD-10-CM | POA: Diagnosis present

## 2022-05-25 DIAGNOSIS — I10 Essential (primary) hypertension: Secondary | ICD-10-CM | POA: Diagnosis not present

## 2022-05-25 DIAGNOSIS — Z91148 Patient's other noncompliance with medication regimen for other reason: Secondary | ICD-10-CM | POA: Diagnosis not present

## 2022-05-25 DIAGNOSIS — I119 Hypertensive heart disease without heart failure: Secondary | ICD-10-CM | POA: Diagnosis not present

## 2022-05-25 DIAGNOSIS — Z720 Tobacco use: Secondary | ICD-10-CM | POA: Diagnosis present

## 2022-05-25 DIAGNOSIS — R7989 Other specified abnormal findings of blood chemistry: Secondary | ICD-10-CM

## 2022-05-25 HISTORY — DX: Essential (primary) hypertension: I10

## 2022-05-25 HISTORY — DX: Other pulmonary embolism without acute cor pulmonale: I26.99

## 2022-05-25 LAB — TROPONIN I (HIGH SENSITIVITY)
Troponin I (High Sensitivity): 61 ng/L — ABNORMAL HIGH (ref <18)
Troponin I (High Sensitivity): 82 ng/L — ABNORMAL HIGH (ref <18)

## 2022-05-25 LAB — CBC
HCT: 51.3 % (ref 39.0–52.0)
Hemoglobin: 16.3 g/dL (ref 13.0–17.0)
MCH: 30.4 pg (ref 26.0–34.0)
MCHC: 31.8 g/dL (ref 30.0–36.0)
MCV: 95.5 fL (ref 80.0–100.0)
Platelets: 242 10*3/uL (ref 150–400)
RBC: 5.37 MIL/uL (ref 4.22–5.81)
RDW: 14.3 % (ref 11.5–15.5)
WBC: 7.2 10*3/uL (ref 4.0–10.5)
nRBC: 0 % (ref 0.0–0.2)

## 2022-05-25 LAB — PROTIME-INR
INR: 1.1 (ref 0.8–1.2)
Prothrombin Time: 14.1 seconds (ref 11.4–15.2)

## 2022-05-25 LAB — BASIC METABOLIC PANEL
Anion gap: 13 (ref 5–15)
BUN: 11 mg/dL (ref 6–20)
CO2: 28 mmol/L (ref 22–32)
Calcium: 9.2 mg/dL (ref 8.9–10.3)
Chloride: 102 mmol/L (ref 98–111)
Creatinine, Ser: 0.9 mg/dL (ref 0.61–1.24)
GFR, Estimated: >60 mL/min (ref >60)
Glucose, Bld: 143 mg/dL — ABNORMAL HIGH (ref 70–99)
Potassium: 3.2 mmol/L — ABNORMAL LOW (ref 3.5–5.1)
Sodium: 143 mmol/L (ref 135–145)

## 2022-05-25 LAB — RESP PANEL BY RT-PCR (RSV, FLU A&B, COVID)  RVPGX2
Influenza A by PCR: NEGATIVE
Influenza B by PCR: NEGATIVE
Resp Syncytial Virus by PCR: NEGATIVE
SARS Coronavirus 2 by RT PCR: NEGATIVE

## 2022-05-25 LAB — APTT: aPTT: 27 seconds (ref 24–36)

## 2022-05-25 LAB — BRAIN NATRIURETIC PEPTIDE: B Natriuretic Peptide: 79.2 pg/mL (ref 0.0–100.0)

## 2022-05-25 LAB — MAGNESIUM: Magnesium: 1.6 mg/dL — ABNORMAL LOW (ref 1.7–2.4)

## 2022-05-25 MED ORDER — ONDANSETRON HCL 4 MG PO TABS: 4.0000 mg | ORAL_TABLET | Freq: Four times a day (QID) | ORAL | Status: DC | PRN

## 2022-05-25 MED ORDER — OXYCODONE HCL 5 MG PO TABS
5.0000 mg | ORAL_TABLET | ORAL | Status: DC | PRN
  Administered 2022-05-25: 5 mg via ORAL
  Filled 2022-05-25: qty 1

## 2022-05-25 MED ORDER — POTASSIUM CHLORIDE 20 MEQ PO PACK
80.0000 meq | PACK | Freq: Once | ORAL | Status: AC
  Administered 2022-05-25: 80 meq via ORAL
  Filled 2022-05-25: qty 4

## 2022-05-25 MED ORDER — ONDANSETRON HCL 4 MG/2ML IJ SOLN: 4.0000 mg | Freq: Four times a day (QID) | INTRAMUSCULAR | Status: DC | PRN

## 2022-05-25 MED ORDER — SODIUM CHLORIDE 0.9% FLUSH
3.0000 mL | Freq: Two times a day (BID) | INTRAVENOUS | Status: DC
  Administered 2022-05-26 – 2022-05-28 (×5): 3 mL via INTRAVENOUS

## 2022-05-25 MED ORDER — HYDRALAZINE HCL 20 MG/ML IJ SOLN
20.0000 mg | Freq: Once | INTRAMUSCULAR | Status: AC
  Administered 2022-05-25: 20 mg via INTRAVENOUS
  Filled 2022-05-25: qty 1

## 2022-05-25 MED ORDER — IOHEXOL 350 MG/ML SOLN
100.0000 mL | Freq: Once | INTRAVENOUS | Status: AC | PRN
  Administered 2022-05-25: 100 mL via INTRAVENOUS

## 2022-05-25 MED ORDER — HEPARIN (PORCINE) 25000 UT/250ML-% IV SOLN
2200.0000 [IU]/h | INTRAVENOUS | Status: AC
  Administered 2022-05-25: 1900 [IU]/h via INTRAVENOUS
  Administered 2022-05-26: 2200 [IU]/h via INTRAVENOUS
  Filled 2022-05-25 (×2): qty 250

## 2022-05-25 MED ORDER — SENNOSIDES-DOCUSATE SODIUM 8.6-50 MG PO TABS: 1.0000 | ORAL_TABLET | Freq: Every evening | ORAL | Status: DC | PRN

## 2022-05-25 MED ORDER — POTASSIUM CHLORIDE CRYS ER 20 MEQ PO TBCR: 40.0000 meq | EXTENDED_RELEASE_TABLET | Freq: Once | ORAL | Status: DC

## 2022-05-25 MED ORDER — HYDRALAZINE HCL 20 MG/ML IJ SOLN
20.0000 mg | INTRAMUSCULAR | Status: DC | PRN
  Administered 2022-05-25 – 2022-05-28 (×4): 20 mg via INTRAVENOUS
  Filled 2022-05-25 (×4): qty 1

## 2022-05-25 MED ORDER — MAGNESIUM SULFATE 2 GM/50ML IV SOLN
2.0000 g | Freq: Once | INTRAVENOUS | Status: AC
  Administered 2022-05-25: 2 g via INTRAVENOUS
  Filled 2022-05-25: qty 50

## 2022-05-25 MED ORDER — ACETAMINOPHEN 325 MG PO TABS
650.0000 mg | ORAL_TABLET | Freq: Four times a day (QID) | ORAL | Status: DC | PRN
  Administered 2022-05-26 – 2022-05-28 (×8): 650 mg via ORAL
  Filled 2022-05-25 (×8): qty 2

## 2022-05-25 MED ORDER — ACETAMINOPHEN 650 MG RE SUPP: 650.0000 mg | Freq: Four times a day (QID) | RECTAL | Status: DC | PRN

## 2022-05-25 MED ORDER — HEPARIN BOLUS VIA INFUSION
3000.0000 [IU] | Freq: Once | INTRAVENOUS | Status: AC
  Administered 2022-05-25: 3000 [IU] via INTRAVENOUS
  Filled 2022-05-25: qty 3000

## 2022-05-25 NOTE — Hospital Course (Signed)
Christopher Green is a 51 y.o. male with medical history significant for HTN, HLD, tobacco use who is admitted with acute PE found to have second-degree AV block.  Started on IV heparin and cardiology to consult.

## 2022-05-25 NOTE — Assessment & Plan Note (Signed)
CTA shows acute segmental and subsegmental PE in the LLL and RUL.  No CT evidence of right heart strain. Saturating well on room air. -Continue IV heparin -Keep on telemetry -Obtain echocardiogram -Supplemental O2 as needed

## 2022-05-25 NOTE — H&P (Signed)
History and Physical    Christopher Green IRJ:[id number] DOB: 1970/11/04 DOA: 05/25/2022  PCP: Patient, No Pcp Per  Patient coming from: Home  I have personally briefly reviewed patient's old medical records in [facility]  Chief Complaint: Dyspnea  HPI: Christopher Green is a 51 y.o. male with medical history significant for HTN, HLD, tobacco use who presents to the ED for evaluation of dyspnea and chest pain.  Patient reports 2 days of dyspnea on exertion and chest pain.  He has had occasional nonproductive cough.  He has had feelings of palpitations.  He thought he was developing a URI.  He has noticed that his blood pressure has been high with SBP over 200 and also that his heart rate has been in the 40s at home.    He reports similar chest pain in October/2019 which was ultimately felt related to significantly elevated blood pressures after cardiac cath showed normal coronaries.  He reports smoking 1 pack/day for 45 years.  Due to his symptoms not improving he came to the ED for further evaluation.  ED Course  Labs/Imaging on admission: I have personally reviewed following labs and imaging studies.  Initial vitals showed BP 208/89, pulse 74, RR 19, temp 98.0 F, SpO2 95% on room air.  Labs show sodium 143, potassium 3.2, bicarb 28, BUN 11, creatinine 0.90, serum glucose 143, WBC 7.2, hemoglobin 16.3, platelets 242,000, troponin 61 > 82, BNP 79.2.  Portable chest x-ray negative for active disease.  CTA chest/abdomen/pelvis dissection study was negative for evidence for acute aortic dissection or aneurysm.  Acute segmental and subsegmental pulmonary emboli in the left lower lobe and right upper lobe noted.  No CT evidence of right heart strain.  Indeterminate enlargement precarinal lymph node measuring 17 mm noted.  No acute process in the abdomen or pelvis.  Patient was started on IV heparin and given IV hydralazine 20 mg.  EDP discussed with on-call cardiology who will see in  consultation.  The hospitalist service was consulted to admit for further evaluation and management.  Review of Systems: All systems reviewed and are negative except as documented in history of present illness above.   Past Medical History:  Diagnosis Date   Hypertension     History reviewed. No pertinent surgical history.  Social History:  has no history on file for tobacco use, alcohol use, and drug use.  No Known Allergies  History reviewed. No pertinent family history.   Prior to Admission medications   Medication Sig Start Date End Date Taking? Authorizing Provider  atorvastatin (LIPITOR) 40 MG tablet Take 1 tablet by mouth daily. 03/16/18   [provider]  carvedilol (COREG) 6.25 MG tablet Take by mouth. 02/18/18   [provider]  celecoxib (CELEBREX) 200 MG capsule Take by mouth. 04/30/21   [provider]  gabapentin (NEURONTIN) 400 MG capsule Take 1 capsule (400 mg total) by mouth 3 (three) times daily. 05/28/21   Lenn Sink, DPM  hydrALAZINE (APRESOLINE) 25 MG tablet Take by mouth. 03/14/18   [provider]  hydrochlorothiazide (HYDRODIURIL) 25 MG tablet Take 1 tablet by mouth daily. 03/16/18   [provider]  metFORMIN (GLUCOPHAGE-XR) 500 MG 24 hr tablet TAKE 1 TABLET BY MOUTH ONCE DAILY. TEST GLUCOSE DAILY AND MAKE LIST, PHONE IT TO OFFICE EVERY 2 WEEKS. 03/31/21   [provider]  oxyCODONE-acetaminophen (PERCOCET) 10-325 MG tablet TAKE 1 TABLET BY MOUTH UP TO 4 TIMES A DAY AS NEEDED FOR PAIN 04/03/21  [provider]  pregabalin (LYRICA) 150 MG capsule Take 150 mg by mouth 2 (two) times daily. 05/20/21   [provider]  spironolactone (ALDACTONE) 25 MG tablet Take 1 tablet by mouth daily. 02/18/18   [provider]  testosterone cypionate (DEPOTESTOSTERONE CYPIONATE) 200 MG/ML injection Inject 200 mg into the muscle every 14 (fourteen) days. 05/16/21   [provider]     Physical Exam: Vitals:   05/25/22 1445 05/25/22 1500 05/25/22 1832 05/25/22 1846  BP: (!) 206/77 (!) 211/92 (!) 144/123 (!) 173/62  Pulse: 81 78  84  Resp: (!) 24 19 18 18   Temp:   98 F (36.7 C)   TempSrc:   Oral   SpO2: 99% 98% 98% 99%  Weight:      Height:       Constitutional: Obese man resting in bed with head elevated.  NAD, calm, comfortable Eyes: EOMI, lids and conjunctivae normal ENMT: Mucous membranes are moist. Posterior pharynx clear of any exudate or lesions.Normal dentition.  Neck: normal, supple, no masses. Respiratory: clear to auscultation bilaterally, no wheezing, no crackles. Normal respiratory effort. No accessory muscle use.  Cardiovascular: Irregular, no murmurs / rubs / gallops. No extremity edema. 2+ pedal pulses. Abdomen: no tenderness, no masses palpated.  Musculoskeletal: no clubbing / cyanosis. No joint deformity upper and lower extremities. Good ROM, no contractures. Normal muscle tone.  Skin: no rashes, lesions, ulcers. No induration Neurologic: Sensation intact. Strength 5/5 in all 4.  Psychiatric: Normal judgment and insight. Alert and oriented x 3. Normal mood.   EKG: Personally reviewed. Sinus rhythm with Mobitz type I second-degree AV block, RBBB and LAFB.  No prior for comparison.  Assessment/Plan Principal Problem:   Acute pulmonary embolism without acute cor pulmonale (HCC) Active Problems:   Second degree AV block   Essential hypertension   Hypokalemia   Hyperlipidemia   Tobacco use   Hypomagnesemia   Christopher Green is a 51 y.o. male with medical history significant for HTN, HLD, tobacco use who is admitted with acute PE found to have second-degree AV block.  Started on IV heparin and cardiology to consult.  Assessment and Plan: * Acute pulmonary embolism without acute cor pulmonale (HCC) CTA shows acute segmental and subsegmental PE in the LLL and RUL.  No CT evidence of right heart strain. Saturating well on room air. -Continue  IV heparin -Keep on telemetry -Obtain echocardiogram -Supplemental O2 as needed  Second degree AV block EKG appears to show second-degree AV block, Mobitz type I.  Currently hemodynamically stable. -Cardiology to consult -Keep on telemetry -Obtain echocardiogram -Hold Coreg  Essential hypertension BP significantly elevated on arrival.  Holding Coreg in setting of second-degree AV block and HCTZ in setting of hypokalemia. -Continue IV hydralazine prn -Continue spironolactone  Tobacco use Reports smoking 1 PPD last 45 years.  Declines nicotine patch.  Hyperlipidemia Continue atorvastatin.  Hypokalemia Hypomagnesemia IV magnesium supplement ordered followed by oral potassium supplement.  DVT prophylaxis: Heparin drip Code Status: Full code, confirmed with patient on admission Family Communication: Spouse, daughter and son at bedside Disposition Plan: From home and likely discharge to home pending clinical progress Consults called: Cardiology Severity of Illness: The appropriate patient status for this patient is OBSERVATION. Observation status is judged to be reasonable and necessary in order to provide the required intensity of service to ensure the patient's safety. The patient's presenting symptoms, physical exam findings, and initial radiographic and laboratory data in the context of their medical condition is felt  to place them at decreased risk for further clinical deterioration. Furthermore, it is anticipated that the patient will be medically stable for discharge from the hospital within 2 midnights of admission.   Zada Finders MD [facility]  If 7PM-7AM, please contact night-coverage [email or web address]  05/25/2022, 8:20 PM

## 2022-05-25 NOTE — ED Provider Triage Note (Signed)
Emergency Medicine Provider Triage Evaluation Note  Christopher Green , a 51 y.o. male  was evaluated in triage.  Pt complains of mid chest pressure that started yesterday afternoon.  He says he feels it in the back of his neck as well.  He says it is gradually worsened.  His associate shortness of breath with exertion.  He says he also feels like his heart skipping beats and has been slow.  He reports not taking his blood pressure medications for the past 3 days.  He states he does not know why, except that his wife must not have set it up for him.  Review of Systems  Positive:  Negative:   Physical Exam  BP (!) 208/89 (BP Location: Right Arm)   Pulse 65   Temp 98 F (36.7 C) (Oral)   Resp 19   Ht 6\' 1"  (1.854 m)   Wt 131.5 kg   SpO2 95%   BMI 38.26 kg/m  Gen:   Awake, no distress   Resp:  Normal effort  MSK:   Moves extremities without difficulty  Other:  Abdomen is soft and nontender, radial pulses 2+ bilaterally, heart rate is irregular.  Medical Decision Making  Medically screening exam initiated at 10:39 AM.  Appropriate orders placed.  was informed that the remainder of the evaluation will be completed by another provider, this initial triage assessment does not replace that evaluation, and the importance of remaining in the ED until their evaluation is complete.     [name], PA-C 05/25/22 1040

## 2022-05-25 NOTE — Assessment & Plan Note (Signed)
Hypomagnesemia IV magnesium supplement ordered followed by oral potassium supplement.

## 2022-05-25 NOTE — Progress Notes (Signed)
ANTICOAGULATION CONSULT NOTE - Initial Consult  Pharmacy Consult for heparin  Indication: pulmonary embolus  No Known Allergies  Patient Measurements: Height: 6\' 1"  (185.4 cm) Weight: 131.5 kg (290 lb) IBW/kg (Calculated) : 79.9 Heparin Dosing Weight: 109.4  Vital Signs: Temp: 97.8 F (36.6 C) (12/11 1356) Temp Source: Oral (12/11 1356) BP: 211/92 (12/11 1500) Pulse Rate: 78 (12/11 1500)  Labs: Recent Labs    05/25/22 1043 05/25/22 1313  HGB 16.3  --   HCT 51.3  --   PLT 242  --   CREATININE 0.90  --   TROPONINIHS 61* 82*    Estimated Creatinine Clearance: 134.9 mL/min (by C-G formula based on SCr of 0.9 mg/dL).   Medical History: Past Medical History:  Diagnosis Date   Hypertension     Assessment: 51 yo with new PE. Pharmacy consulted to dose heparin.  No anticoagulant PTA. CBC WNL, SCr WNL.  Goal of Therapy:  Heparin level 0.3-0.7 units/ml Monitor platelets by anticoagulation protocol: Yes   Plan:  Give 3000 units bolus x 1 Start heparin infusion at 1900 units/hr Check anti-Xa level in 6 hours and daily while on heparin Continue to monitor H&H and platelets  40, Pharm.D Use secure chat for questions 05/25/2022 6:23 PM

## 2022-05-25 NOTE — ED Provider Notes (Signed)
[facility]-EMERGENCY DEPT Provider Note   CSN: [id number] Arrival date & time: 05/25/22  1011     History  Chief Complaint  Patient presents with   Chest Pain    Christopher Green is a 51 y.o. male.  With history of hypertension who presents ED for evaluation of substernal chest pain and dyspnea on exertion.  Symptoms started 2 to 3 days ago and have progressively gotten worse. Believes he has an upper respiratory infection causing his symptoms. Had similar symptoms a few years ago, was admitted to the hospital and had a normal cardiac workup. Takes antihypertensives, but has not had them in the past 3 days. Does not know why he hasn't taken them, states his wife must not have prepared them for him. Typically does not have shortness of breath. Can only walk approximately 10 feet before being out of breath. Chest pain is substernal, radiates to between his shoulder blades, is constant, is described as a pressure, and is rated at a 4 out of 10. Took his blood pressure at home and found the systolic BP to be over 200, which prompted his visit today. Denies nausea, vomiting, diaphoresis, fevers, abdominal pain, hemoptysis, unilateral leg swelling, history of DVT or PE, recent travel, sick contacts. Has nasal congestion and rhinorrhea. Smokes 1 PPD for the past 40 years.   Chest Pain Associated symptoms: shortness of breath        Home Medications Prior to Admission medications   Medication Sig Start Date End Date Taking? Authorizing Provider  atorvastatin (LIPITOR) 40 MG tablet Take 1 tablet by mouth daily. 03/16/18   [provider]  carvedilol (COREG) 6.25 MG tablet Take by mouth. 02/18/18   [provider]  celecoxib (CELEBREX) 200 MG capsule Take by mouth. 04/30/21   [provider]  gabapentin (NEURONTIN) 400 MG capsule Take 1 capsule (400 mg total) by mouth 3 (three) times daily. 05/28/21   Lenn Sink, DPM  hydrALAZINE (APRESOLINE) 25  MG tablet Take by mouth. 03/14/18   [provider]  hydrochlorothiazide (HYDRODIURIL) 25 MG tablet Take 1 tablet by mouth daily. 03/16/18   [provider]  metFORMIN (GLUCOPHAGE-XR) 500 MG 24 hr tablet TAKE 1 TABLET BY MOUTH ONCE DAILY. TEST GLUCOSE DAILY AND MAKE LIST, PHONE IT TO OFFICE EVERY 2 WEEKS. 03/31/21   [provider]  oxyCODONE-acetaminophen (PERCOCET) 10-325 MG tablet TAKE 1 TABLET BY MOUTH UP TO 4 TIMES A DAY AS NEEDED FOR PAIN 04/03/21   [provider]  pregabalin (LYRICA) 150 MG capsule Take 150 mg by mouth 2 (two) times daily. 05/20/21   [provider]  spironolactone (ALDACTONE) 25 MG tablet Take 1 tablet by mouth daily. 02/18/18   [provider]  testosterone cypionate (DEPOTESTOSTERONE CYPIONATE) 200 MG/ML injection Inject 200 mg into the muscle every 14 (fourteen) days. 05/16/21   [provider]      Allergies    Patient has no known allergies.    Review of Systems   Review of Systems  Respiratory:  Positive for shortness of breath.   Cardiovascular:  Positive for chest pain.  All other systems reviewed and are negative.   Physical Exam Updated Vital Signs BP (!) 173/62 (BP Location: Right Arm)   Pulse 84   Temp 98 F (36.7 C) (Oral)   Resp 18   Ht 6\' 1"  (1.854 m)   Wt 131.5 kg   SpO2 99%   BMI 38.26 kg/m  Physical Exam Vitals and  nursing note reviewed.  Constitutional:      General: He is not in acute distress.    Appearance: He is well-developed. He is obese. He is not ill-appearing, toxic-appearing or diaphoretic.  HENT:     Head: Normocephalic and atraumatic.  Eyes:     Conjunctiva/sclera: Conjunctivae normal.  Neck:     Vascular: No JVD.  Cardiovascular:     Rate and Rhythm: Normal rate. Rhythm irregular.     Pulses:          Radial pulses are 2+ on the right side and 2+ on the left side.       Dorsalis pedis pulses are 2+ on the right side and 2+ on the left side.     Heart  sounds: No murmur heard. Pulmonary:     Effort: Pulmonary effort is normal. No respiratory distress.     Breath sounds: Normal breath sounds. No decreased breath sounds, wheezing, rhonchi or rales.  Abdominal:     Palpations: Abdomen is soft. There is no hepatomegaly.     Tenderness: There is no abdominal tenderness. There is no guarding or rebound.  Musculoskeletal:        General: No swelling.     Cervical back: Neck supple.     Right lower leg: No edema.     Left lower leg: No edema.  Skin:    General: Skin is warm and dry.     Capillary Refill: Capillary refill takes less than 2 seconds.  Neurological:     General: No focal deficit present.     Mental Status: He is alert and oriented to person, place, and time.  Psychiatric:        Mood and Affect: Mood normal.     ED Results / Procedures / Treatments   Labs (all labs ordered are listed, but only abnormal results are displayed) Labs Reviewed  BASIC METABOLIC PANEL - Abnormal; Notable for the following components:      Result Value   Potassium 3.2 (*)    Glucose, Bld 143 (*)    All other components within normal limits  TROPONIN I (HIGH SENSITIVITY) - Abnormal; Notable for the following components:   Troponin I (High Sensitivity) 61 (*)    All other components within normal limits  TROPONIN I (HIGH SENSITIVITY) - Abnormal; Notable for the following components:   Troponin I (High Sensitivity) 82 (*)    All other components within normal limits  RESP PANEL BY RT-PCR (RSV, FLU A&B, COVID)  RVPGX2  CBC  BRAIN NATRIURETIC PEPTIDE  PROTIME-INR  APTT  CBC  HEPARIN LEVEL (UNFRACTIONATED)  MAGNESIUM    EKG EKG Interpretation  Date/Time:  Monday May 25 2022 13:57:15 EST Ventricular Rate:  63 PR Interval:  76 QRS Duration: 156 QT Interval:  491 QTC Calculation: 503 R Axis:   254 Text Interpretation: Second degree AV block, Mobitz II Atrial premature complexes in couplets Biatrial enlargement RBBB and LAFB  Confirmed by Vivi Barrack [phone]) on 05/25/2022 2:55:33 PM  Radiology CT Angio Chest/Abd/Pel for Dissection W and/or Wo Contrast  Result Date: 05/25/2022 CLINICAL DATA:  Acute aortic syndrome suspected. EXAM: CT ANGIOGRAPHY CHEST, ABDOMEN AND PELVIS TECHNIQUE: Non-contrast CT of the chest was initially obtained. Multidetector CT imaging through the chest, abdomen and pelvis was performed using the standard protocol during bolus administration of intravenous contrast. Multiplanar reconstructed images and MIPs were obtained and reviewed to evaluate the vascular anatomy. RADIATION DOSE REDUCTION: This exam was performed according to the departmental dose-optimization  program which includes automated exposure control, adjustment of the mA and/or kV according to patient size and/or use of iterative reconstruction technique. CONTRAST:  OMNIPAQUE IOHEXOL 350 MG/ML SOLN COMPARISON:  None Available. FINDINGS: CTA CHEST FINDINGS Cardiovascular: There is adequate opacification of the thoracic aorta. There is no evidence for aneurysm or dissection. There is mild calcified atherosclerotic disease throughout the aorta. Heart is borderline enlarged. There is no pericardial effusion. There is also opacification of the pulmonary arteries. There are segmental and subsegmental pulmonary emboli within the left lower lobe and right upper lobe. Mediastinum/Nodes: There is an enlarged precarinal lymph node measuring 17 mm short axis. No other enlarged lymph nodes are identified. Visualized thyroid gland and esophagus are within normal limits. Lungs/Pleura: Lungs are clear. No pleural effusion or pneumothorax. Musculoskeletal: No chest wall abnormality. No acute or significant osseous findings. Review of the MIP images confirms the above findings. CTA ABDOMEN AND PELVIS FINDINGS VASCULAR Aorta: Normal caliber aorta without aneurysm, dissection, vasculitis or significant stenosis. There is moderate calcified atherosclerotic  disease throughout the aorta. Celiac: Patent without evidence of aneurysm, dissection, vasculitis or significant stenosis. SMA: Patent without evidence of aneurysm, dissection, vasculitis or significant stenosis. Renals: Both renal arteries are patent without evidence of aneurysm, dissection, vasculitis, fibromuscular dysplasia or significant stenosis. IMA: Patent without evidence of aneurysm, dissection, vasculitis or significant stenosis. Inflow: Patent without evidence of aneurysm, dissection, vasculitis or significant stenosis. Veins: No obvious venous abnormality within the limitations of this arterial phase study. Review of the MIP images confirms the above findings. NON-VASCULAR Hepatobiliary: No focal liver abnormality is seen. No gallstones, gallbladder wall thickening, or biliary dilatation. Pancreas: Unremarkable. No pancreatic ductal dilatation or surrounding inflammatory changes. Spleen: Normal in size without focal abnormality. Adrenals/Urinary Tract: Adrenal glands are unremarkable. Kidneys are normal, without renal calculi, focal lesion, or hydronephrosis. Bladder is unremarkable. Stomach/Bowel: Stomach is within normal limits. Appendix appears normal. No evidence of bowel wall thickening, distention, or inflammatory changes. Lymphatic: No enlarged lymph nodes are seen. Reproductive: Mildly enlarged prostate gland. Other: Small fat containing right inguinal hernia.  No ascites. Musculoskeletal: No acute or significant osseous findings. Review of the MIP images confirms the above findings. IMPRESSION: 1. No evidence for aortic dissection or aneurysm. 2. Acute segmental and subsegmental pulmonary emboli in the left lower lobe and right upper lobe. No evidence for right heart strain. 3. Enlarged precarinal lymph node measuring 17 mm, indeterminate. 4. No acute process in the abdomen or pelvis. Aortic Atherosclerosis (ICD10-I70.0). Electronically Signed   By: Darliss Cheney M.D.   On: 05/25/2022 17:52    DG Chest 1 View  Result Date: 05/25/2022 CLINICAL DATA:  Chest pain.  Shortness of breath. EXAM: CHEST  1 VIEW COMPARISON:  March 25, 2018. FINDINGS: The heart size and mediastinal contours are within normal limits. Both lungs are clear. The visualized skeletal structures are unremarkable. IMPRESSION: No active disease. Electronically Signed   By: Lupita Raider M.D.   On: 05/25/2022 11:12    Procedures .Critical Care  Performed by: Michelle Piper, PA-C Authorized by: Michelle Piper, PA-C   Critical care provider statement:    Critical care time (minutes):  41   Critical care was necessary to treat or prevent imminent or life-threatening deterioration of the following conditions:  Respiratory failure   Critical care was time spent personally by me on the following activities:  Development of treatment plan with patient or surrogate, discussions with consultants, evaluation of patient's response to treatment, examination of patient,  ordering and review of laboratory studies, ordering and review of radiographic studies, ordering and performing treatments and interventions, pulse oximetry, re-evaluation of patient's condition and review of old charts   Care discussed with: admitting provider   Comments:     PE requiring heparin and admission     Medications Ordered in ED Medications  heparin ADULT infusion 100 units/mL (25000 units/22650mL) (1,900 Units/hr Intravenous New Bag/Given 05/25/22 1847)  potassium chloride SA (KLOR-CON M) CR tablet 40 mEq (has no administration in time range)  hydrALAZINE (APRESOLINE) injection 20 mg (has no administration in time range)  iohexol (OMNIPAQUE) 350 MG/ML injection 100 mL (100 mLs Intravenous Contrast Given 05/25/22 1719)  hydrALAZINE (APRESOLINE) injection 20 mg (20 mg Intravenous Given 05/25/22 1818)  heparin bolus via infusion 3,000 Units (3,000 Units Intravenous Bolus from Bag 05/25/22 1848)    ED Course/ Medical Decision Making/  A&P Clinical Course as of 05/25/22 1954  Mon May 25, 2022  1410 EKG 12-Lead 2nd degree type 1, new from 2019 [AS]  1524 Spoke with cardiology, Samara DeistKathryn, who states that cardiology will consult as an inpatient as long as remainder of the workup is normal.  Telemetry will be best for assessment of AV block. [AS]  [id number] Spoke with hospitalist Dr. Allena KatzPatel.  Will admit for PE [AS]  1855 Med List 25 mg hydralazine BID 12.5 mg spironolactone 40 mg atorvastatin 81 mg aspirin 500 mg metformin BID 25 mg HCTZ 4-6 10-325 percocet daily for chronic back pain [AS]    Clinical Course User Index [AS] Michelle PiperSchutt, Neyla Gauntt M, PA-C           HEART Score: 4                Medical Decision Making Amount and/or Complexity of Data Reviewed Labs: ordered. Radiology: ordered. ECG/medicine tests:  Decision-making details documented in ED Course.  Risk Prescription drug management. Decision regarding hospitalization.  This patient presents to the ED for concern of chest pain, shortness of breath, this involves an extensive number of treatment options, and is a complaint that carries with it a high risk of complications and morbidity.  The emergent differential diagnosis of chest pain includes: Acute coronary syndrome, pericarditis, aortic dissection, pulmonary embolism, tension pneumothorax, and esophageal rupture.  other urgent/non-acute considerations include, but are not limited to: chronic angina, aortic stenosis, cardiomyopathy, myocarditis, mitral valve prolapse, pulmonary hypertension, hypertrophic obstructive cardiomyopathy (HOCM), aortic insufficiency, right ventricular hypertrophy, pneumonia, pleuritis, bronchitis, pneumothorax, tumor, gastroesophageal reflux disease (GERD), esophageal spasm, Mallory-Weiss syndrome, peptic ulcer disease, biliary disease, pancreatitis, functional gastrointestinal pain, cervical or thoracic disk disease or arthritis, shoulder arthritis, costochondritis, subacromial bursitis,  anxiety or panic attack, herpes zoster, breast disorders, chest wall tumors, thoracic outlet syndrome, mediastinitis.  The emergent differential diagnosis for shortness of breath includes, but is not limited to, Pulmonary edema, bronchoconstriction, Pneumonia, Pulmonary embolism, Pneumotherax/ Hemothorax, Dysrythmia, ACS.    Co morbidities that complicate the patient evaluation  hypertension  My initial workup includes ACS workup, CT dissection study  Additional history obtained from: Nursing notes from this visit. Previous records within EMR system office visit to cardiology on 04/13/2018. Had abnormal myocardial perfusion study on 03/08/2018. Had a normal heart cath on 03/25/2018. [facility] which were thought to be elevated due to severe hypertension. Unable to find those troponins in chart review.   I ordered, reviewed and interpreted labs which include: BMP, CBC, magnesium, BMP, troponin, respiratory panel, INR.  Initial troponin elevated at 61.  Repeat elevated at  82.  Hypokalemia 3.2 which was replaced in the ED.   I ordered imaging studies including CXR, CT dissection study I independently visualized and interpreted imaging which showed normal CXR, CT dissection does show multiple segmental PEs in the right upper lobe and left lower lobe I agree with the radiologist interpretation  Cardiac Monitoring:  The patient was maintained on a cardiac monitor.  I personally viewed and interpreted the cardiac monitored which showed an underlying rhythm of: alternation of 2nd degree type 1 and 2  Consultations Obtained:  I requested consultation with the cardiology, Santina Evans,  and discussed lab and imaging findings as well as pertinent plan - they recommend: admission to medicine with cardiology consult if work up is normal and patient is clinically stable appearing  Consulted with hospitalist Dr. Allena Katz.  Will admit for PE.  Afebrile, initially hypertensive  with systolic blood pressures over 409 and diastolic over 100.  Patient was given 1 dose of IV hydralazine with reduction of his blood pressure to 173/62.  He is reporting mild chest pain throughout his stay.  Lab workup remarkable for initial troponin of 61 and repeat troponin of 82.  EKG does show send with type I and possible second-degree type II AV block.  Believe this is likely new.  Hypokalemia replaced.  CT dissection study does show multiple PEs.  He will be started on heparin and admitted to hospitalist.  Outside of elevated troponins, he does not show signs of right heart strain and appears overall well. Stable at the time of admission.  Patient's case discussed with Dr. Jearld Fenton who agrees with plan to admit.   Note: Portions of this report may have been transcribed using voice recognition software. Every effort was made to ensure accuracy; however, inadvertent computerized transcription errors may still be present.          Final Clinical Impression(s) / ED Diagnoses Final diagnoses:  Multiple subsegmental pulmonary emboli without acute cor pulmonale (HCC)  Elevated troponin    Rx / DC Orders ED Discharge Orders     None         Michelle Piper, PA-C 05/25/22 1954    Loetta Rough, MD 05/26/22 [phone]

## 2022-05-25 NOTE — ED Triage Notes (Addendum)
Patient said he began having chest pain that radiates to his back a few days ago. York Spaniel he is short of breath and worsens when he walks. Skipped his BP medication for 3 days.

## 2022-05-25 NOTE — Assessment & Plan Note (Signed)
Reports smoking 1 PPD last 45 years.  Declines nicotine patch.

## 2022-05-25 NOTE — Assessment & Plan Note (Signed)
Continue atorvastatin

## 2022-05-25 NOTE — Assessment & Plan Note (Signed)
BP significantly elevated on arrival.  Holding Coreg in setting of second-degree AV block and HCTZ in setting of hypokalemia. -Continue IV hydralazine prn -Continue spironolactone

## 2022-05-25 NOTE — Assessment & Plan Note (Signed)
EKG appears to show second-degree AV block, Mobitz type I.  Currently hemodynamically stable. -Cardiology to consult -Keep on telemetry -Obtain echocardiogram -Hold Coreg

## 2022-05-26 ENCOUNTER — Encounter (HOSPITAL_COMMUNITY): Payer: Self-pay | Admitting: Internal Medicine

## 2022-05-26 ENCOUNTER — Observation Stay (HOSPITAL_BASED_OUTPATIENT_CLINIC_OR_DEPARTMENT_OTHER): Payer: BC Managed Care – PPO

## 2022-05-26 ENCOUNTER — Observation Stay (HOSPITAL_COMMUNITY): Payer: BC Managed Care – PPO

## 2022-05-26 ENCOUNTER — Other Ambulatory Visit (HOSPITAL_COMMUNITY): Payer: Self-pay

## 2022-05-26 DIAGNOSIS — I2694 Multiple subsegmental pulmonary emboli without acute cor pulmonale: Secondary | ICD-10-CM | POA: Diagnosis not present

## 2022-05-26 DIAGNOSIS — I161 Hypertensive emergency: Secondary | ICD-10-CM

## 2022-05-26 DIAGNOSIS — I441 Atrioventricular block, second degree: Secondary | ICD-10-CM | POA: Diagnosis not present

## 2022-05-26 DIAGNOSIS — I82409 Acute embolism and thrombosis of unspecified deep veins of unspecified lower extremity: Secondary | ICD-10-CM

## 2022-05-26 DIAGNOSIS — I2699 Other pulmonary embolism without acute cor pulmonale: Secondary | ICD-10-CM | POA: Diagnosis not present

## 2022-05-26 HISTORY — DX: Acute embolism and thrombosis of unspecified deep veins of unspecified lower extremity: I82.409

## 2022-05-26 LAB — CBC
HCT: 47.9 % (ref 39.0–52.0)
Hemoglobin: 15.6 g/dL (ref 13.0–17.0)
MCH: 31.4 pg (ref 26.0–34.0)
MCHC: 32.6 g/dL (ref 30.0–36.0)
MCV: 96.4 fL (ref 80.0–100.0)
Platelets: 226 10*3/uL (ref 150–400)
RBC: 4.97 MIL/uL (ref 4.22–5.81)
RDW: 14.2 % (ref 11.5–15.5)
WBC: 8.1 10*3/uL (ref 4.0–10.5)
nRBC: 0 % (ref 0.0–0.2)

## 2022-05-26 LAB — BASIC METABOLIC PANEL
Anion gap: 10 (ref 5–15)
BUN: 15 mg/dL (ref 6–20)
CO2: 26 mmol/L (ref 22–32)
Calcium: 8.8 mg/dL — ABNORMAL LOW (ref 8.9–10.3)
Chloride: 104 mmol/L (ref 98–111)
Creatinine, Ser: 0.88 mg/dL (ref 0.61–1.24)
GFR, Estimated: >60 mL/min (ref >60)
Glucose, Bld: 143 mg/dL — ABNORMAL HIGH (ref 70–99)
Potassium: 3.2 mmol/L — ABNORMAL LOW (ref 3.5–5.1)
Sodium: 140 mmol/L (ref 135–145)

## 2022-05-26 LAB — ECHOCARDIOGRAM COMPLETE
Area-P 1/2: 3.7 cm2
Height: 73 in
S' Lateral: 3.8 cm
Weight: 4640 oz

## 2022-05-26 LAB — HEPARIN LEVEL (UNFRACTIONATED)
Heparin Unfractionated: 0.19 IU/mL — ABNORMAL LOW (ref 0.30–0.70)
Heparin Unfractionated: 0.39 IU/mL (ref 0.30–0.70)

## 2022-05-26 LAB — MAGNESIUM: Magnesium: 1.8 mg/dL (ref 1.7–2.4)

## 2022-05-26 LAB — HIV ANTIBODY (ROUTINE TESTING W REFLEX): HIV Screen 4th Generation wRfx: NONREACTIVE

## 2022-05-26 LAB — TSH: TSH: 1.065 u[IU]/mL (ref 0.350–4.500)

## 2022-05-26 MED ORDER — HYDRALAZINE HCL 25 MG PO TABS
25.0000 mg | ORAL_TABLET | Freq: Two times a day (BID) | ORAL | Status: DC
  Administered 2022-05-26 – 2022-05-28 (×5): 25 mg via ORAL
  Filled 2022-05-26 (×5): qty 1

## 2022-05-26 MED ORDER — HEPARIN BOLUS VIA INFUSION
3000.0000 [IU] | Freq: Once | INTRAVENOUS | Status: AC
  Administered 2022-05-26: 3000 [IU] via INTRAVENOUS
  Filled 2022-05-26: qty 3000

## 2022-05-26 MED ORDER — IRBESARTAN 75 MG PO TABS
75.0000 mg | ORAL_TABLET | Freq: Every day | ORAL | Status: DC
  Administered 2022-05-26 – 2022-05-27 (×2): 75 mg via ORAL
  Filled 2022-05-26 (×2): qty 1

## 2022-05-26 MED ORDER — POTASSIUM CHLORIDE CRYS ER 20 MEQ PO TBCR
40.0000 meq | EXTENDED_RELEASE_TABLET | Freq: Once | ORAL | Status: AC
  Administered 2022-05-26: 40 meq via ORAL
  Filled 2022-05-26: qty 2

## 2022-05-26 MED ORDER — PERFLUTREN LIPID MICROSPHERE
1.0000 mL | INTRAVENOUS | Status: AC | PRN
  Administered 2022-05-26: 2 mL via INTRAVENOUS

## 2022-05-26 MED ORDER — OXYCODONE HCL 5 MG PO TABS
5.0000 mg | ORAL_TABLET | ORAL | Status: DC | PRN
  Administered 2022-05-26 (×4): 10 mg via ORAL
  Administered 2022-05-26: 5 mg via ORAL
  Administered 2022-05-27 (×4): 10 mg via ORAL
  Administered 2022-05-28: 5 mg via ORAL
  Administered 2022-05-28: 10 mg via ORAL
  Filled 2022-05-26 (×10): qty 2
  Filled 2022-05-26: qty 1

## 2022-05-26 MED ORDER — POTASSIUM CHLORIDE 20 MEQ PO PACK
40.0000 meq | PACK | Freq: Once | ORAL | Status: DC
  Filled 2022-05-26: qty 2

## 2022-05-26 MED ORDER — SALINE SPRAY 0.65 % NA SOLN
1.0000 | NASAL | Status: DC | PRN
  Administered 2022-05-26: 1 via NASAL
  Filled 2022-05-26: qty 44

## 2022-05-26 MED ORDER — RIVAROXABAN 15 MG PO TABS
15.0000 mg | ORAL_TABLET | Freq: Two times a day (BID) | ORAL | Status: DC
  Administered 2022-05-26 – 2022-05-28 (×4): 15 mg via ORAL
  Filled 2022-05-26 (×4): qty 1

## 2022-05-26 MED ORDER — RIVAROXABAN 20 MG PO TABS: 20.0000 mg | ORAL_TABLET | Freq: Every day | ORAL | Status: DC

## 2022-05-26 NOTE — Discharge Instructions (Signed)
Information on my medicine - XARELTO (rivaroxaban)  WHY WAS XARELTO PRESCRIBED FOR YOU? Xarelto was prescribed to treat blood clots that may have been found in the veins of your legs (deep vein thrombosis) or in your lungs (pulmonary embolism) and to reduce the risk of them occurring again.  What do you need to know about Xarelto? The starting dose is one 15 mg tablet taken TWICE daily with food for the FIRST 21 DAYS then on (enter date)  06/16/2022  the dose is changed to one 20 mg tablet taken ONCE A DAY with your evening meal.  DO NOT stop taking Xarelto without talking to the health care provider who prescribed the medication.  Refill your prescription for 20 mg tablets before you run out.  After discharge, you should have regular check-up appointments with your healthcare provider that is prescribing your Xarelto.  In the future your dose may need to be changed if your kidney function changes by a significant amount.  What do you do if you miss a dose? If you are taking Xarelto TWICE DAILY and you miss a dose, take it as soon as you remember. You may take two 15 mg tablets (total 30 mg) at the same time then resume your regularly scheduled 15 mg twice daily the next day.  If you are taking Xarelto ONCE DAILY and you miss a dose, take it as soon as you remember on the same day then continue your regularly scheduled once daily regimen the next day. Do not take two doses of Xarelto at the same time.   Important Safety Information Xarelto is a blood thinner medicine that can cause bleeding. You should call your healthcare provider right away if you experience any of the following: Bleeding from an injury or your nose that does not stop. Unusual colored urine (red or dark brown) or unusual colored stools (red or black). Unusual bruising for unknown reasons. A serious fall or if you hit your head (even if there is no bleeding).  Some medicines may interact with Xarelto and might  increase your risk of bleeding while on Xarelto. To help avoid this, consult your healthcare provider or pharmacist prior to using any new prescription or non-prescription medications, including herbals, vitamins, non-steroidal anti-inflammatory drugs (NSAIDs) and supplements.  This website has more information on Xarelto: VisitDestination.com.br.

## 2022-05-26 NOTE — Progress Notes (Signed)
BLE venous duplex has been completed.  Preliminary findings given to [place], Charity fundraiser.   Results can be found under chart review under CV PROC. 05/26/2022 1:04 PM Tanya Marvin RVT, RDMS

## 2022-05-26 NOTE — Progress Notes (Signed)
PROGRESS NOTE  Christopher Green  DOB: April 05, 1971  PCP: Patient, No Pcp Per HUT:[id number]  DOA: 05/25/2022  LOS: 0 days  Hospital Day: 2  Brief narrative: Christopher Green is a 51 y.o. male with PMH significant for HTN, HLD, chronic smoker, smokes about 1 pack/day for last 45 years. 12/11, patient presented to the ED with 3 days history of chest pain, shortness of breath more on exertion.  He skipped his blood pressure medicines in that duration and notices blood pressure over 200 and hence came to the ED He reports similar chest pain in October 2019 and cardiac cath reportedly showed normal coronaries.  Chest pain was attributed to elevated blood pressure.  In the ED blood pressure was elevated 208/89, heart rate 74, afebrile, breathing on room air Labs with troponin elevated to 61 > 82 EKG showed second-degree AV block CT angio chest showed an acute segmental and subsegmental pulmonary emboli in the left lower lobe and right upper lobe without evidence of heart strain. It also noted an indeterminate enlargement of precarinal lymph node measuring 17 mm.  No acute process in the abdomen or pelvis. Patient was started on IV heparin and given IV hydralazine 20 mg.   EDP discussed with cardiologist on-call Admitted to [facility]  Subjective: Patient was seen and examined this morning.  [name].  Sitting up in bed.  Not in distress.  Not on supplemental oxygen.  Wife at bedside. Chart reviewed Overnight, blood pressure improving gradually.  180s this morning, heart rate in 50s. Labs with CBC unremarkable, potassium low at 3.2, pending echocardiogram  Assessment and plan: Acute pulmonary embolism without acute cor pulmonale  Acute left lower extremity DVT CTA showed acute segmental and subsegmental PE in the LLL and RUL.  Ultrasound duplex of lower extremities showed acute DVT involving the left posterior tibial veins and left peroneal veins. No CT evidence of right heart strain.  Saturating well on room air. Currently on IV heparin.  Plan to switch to Xarelto today. Continue to monitor on telemetry  Hypertensive urgency Reports intermittent noncompliance to meds.  Skipped meds for 3 days prior to presentation.  Blood pressure elevated over 200 initially.   Cardiology consult appreciated.  Started on atorvastatin and hydralazine.  Continue to monitor.  Elevated troponin Likely because of PE.  Reportedly normal cardiac cath in 2019 Echocardiogram without wall motion abnormality Recent Labs    05/25/22 1043 05/25/22 1313  TROPONINIHS 61* 82*   Second degree AV block EKG appears to show second-degree AV block, Mobitz type I.  Currently hemodynamically stable. Cardiology consulted Beta-blocker avoided.  Hypokalemia/hypomagnesemia Replacement given.  Potassium still low this morning.  Further replacement ordered.. Recent Labs  Lab 05/25/22 1043 05/25/22 1323 05/26/22 0129  K 3.2*  --  3.2*  MG  --  1.6* 1.8    Chronically smoker Reports smoking 1 PPD last 45 years.  Counseled to quit  Nicotine patch offered    Hyperlipidemia Continue atorvastatin.   Goals of care   Code Status: Full Code    Mobility: Encourage ambulation  Infusions:   heparin 2,200 Units/hr (05/26/22 0541)    Scheduled Meds:  potassium chloride  40 mEq Oral Once   sodium chloride flush  3 mL Intravenous Q12H    PRN meds: acetaminophen **OR** acetaminophen, hydrALAZINE, ondansetron **OR** ondansetron (ZOFRAN) IV, oxyCODONE, senna-docusate   Skin assessment:     Nutritional status:  Body mass index is 38.26 kg/m.  Diet:  Diet Order             Diet Heart Room service appropriate? Yes; Fluid consistency: Thin  Diet effective now                   DVT prophylaxis: Oral anticoagulant    Antimicrobials: None Fluid: None Consultants: Cardio Family Communication: Wife at bedside  Status is: Observation  Continue in-hospital care because:  Blood pressure medicine adjustment by cardiology Level of care: Progressive   Dispo: The patient is from: Home              Anticipated d/c is to: Home              Patient currently is not medically stable to d/c.   Difficult to place patient No    Antimicrobials: Anti-infectives (From admission, onward)    None       Objective: Vitals:   05/26/22 0029 05/26/22 0534  BP: (!) 169/72 (!) 185/76  Pulse: (!) 57 (!) 52  Resp: 20 20  Temp: 98.1 F (36.7 C) 97.8 F (36.6 C)  SpO2: 98% 96%    Intake/Output Summary (Last 24 hours) at 05/26/2022 0815 Last data filed at 05/26/2022 0600 Gross per 24 hour  Intake 307.06 ml  Output --  Net 307.06 ml   Filed Weights   05/25/22 1026  Weight: 131.5 kg   Weight change:  Body mass index is 38.26 kg/m.   Physical Exam: General exam: Pleasant, [name].  Not in physical distress Skin: No rashes, lesions or ulcers. HEENT: Atraumatic, normocephalic, no obvious bleeding Lungs: Clear to auscultation bilaterally CVS: Regular rate and rhythm, no murmur GI/Abd soft, nontender, nondistended, bowel sound present CNS: Alert, awake, oriented x 3 Psychiatry: Mood appropriate Extremities: No pedal edema, no calf tenderness  Data Review: I have personally reviewed the laboratory data and studies available.  F/u labs ordered Unresulted Labs (From admission, onward)     Start     Ordered   05/26/22 0930  Heparin level (unfractionated)  Once-Timed,   TIMED       Question:  Specimen collection method  Answer:  Lab=Lab collect   05/26/22 0303   05/26/22 0500  CBC  Daily,   R      05/25/22 1826   05/26/22 0500  HIV Antibody (routine testing w rflx)  (HIV Antibody (Routine testing w reflex) panel)  Tomorrow morning,   R        05/25/22 2006            Signed, Lorin Glass, MD [facility] 05/26/2022

## 2022-05-26 NOTE — Progress Notes (Signed)
  Echocardiogram 2D Echocardiogram has been performed.  Milda Smart 05/26/2022, 8:55 AM

## 2022-05-26 NOTE — TOC Benefit Eligibility Note (Addendum)
Patient Product/process development scientist completed.    The patient is currently admitted and upon discharge could be taking Xarelto 20 mg.  The current 30 day co-pay is $70.00.   The patient is currently admitted and upon discharge could be taking Eliquis 5 mg.  The current 30 day co-pay is $70.00.   The patient is insured through [facility] of [employer]   Roland Earl, CPHT Pharmacy Patient Advocate Specialist [facility] Patient Advocate Team Direct Number: [phone]  Fax: [phone]

## 2022-05-26 NOTE — Progress Notes (Signed)
Pharmacy - Anticoag  Assessment:    Please see note from Lynann Beaver, PharmD earlier today for full details.  Briefly, 52 y.o. male on IV heparin for new PE  Most recent heparin level therapeutic on 2200 units/hr Per MD, switching to Xarelto tonight  Plan:  Start Xarelto 15 mg PO bid x 21 days, followed by 20 mg PO daily thereafter Stop heparin with first dose of Xarelto Pharmacy to provide Xarelto education and coupons prior to discharge  Bernadene Person, PharmD, BCPS [phone] 05/26/2022, 4:06 PM

## 2022-05-26 NOTE — Progress Notes (Signed)
ANTICOAGULATION CONSULT NOTE - Follow-Up Consult  Pharmacy Consult for heparin  Indication: pulmonary embolus  No Known Allergies  Patient Measurements: Height: 6\' 1"  (185.4 cm) Weight: 131.5 kg (290 lb) IBW/kg (Calculated) : 79.9 Heparin Dosing Weight: 109.4  Vital Signs: Temp: 98.1 F (36.7 C) (12/12 0029) Temp Source: Oral (12/12 0029) BP: 169/72 (12/12 0029) Pulse Rate: 57 (12/12 0029)  Labs: Recent Labs    05/25/22 1043 05/25/22 1313 05/25/22 1849 05/26/22 0129  HGB 16.3  --   --  15.6  HCT 51.3  --   --  47.9  PLT 242  --   --  226  APTT  --   --  27  --   LABPROT  --   --  14.1  --   INR  --   --  1.1  --   HEPARINUNFRC  --   --   --  0.19*  CREATININE 0.90  --   --   --   TROPONINIHS 61* 82*  --   --      Estimated Creatinine Clearance: 134.9 mL/min (by C-G formula based on SCr of 0.9 mg/dL).   Medical History: Past Medical History:  Diagnosis Date   Hypertension     Assessment: 51 yo with new PE. Pharmacy consulted to dose heparin.  No anticoagulant PTA.   05/26/22 Heparin level = 0.19 (subtherapeutic) with heparin gtt @ 1900 units/hr CBC wnl Confirmed with RN no interruption in therapy/no line issues No complications of therapy noted  Goal of Therapy:  Heparin level 0.3-0.7 units/ml Monitor platelets by anticoagulation protocol: Yes   Plan:  Rebolus heparin 3000 units IV x 1 Increase heparin gtt to 2200 units/hr Check heparin level 6 hr after rate increase Daily CBC and heparin level  14/12/23, PharmD 05/26/2022 3:01 AM

## 2022-05-26 NOTE — Progress Notes (Signed)
  Transition of Care [facility]) Screening Note   Patient Details  Name: Christopher Green Date of Birth: 27-Jul-1970   Transition of Care [facility]) CM/SW Contact:    Lanier Clam, RN Phone Number: 05/26/2022, 9:50 AM    Transition of Care Department [facility]) has reviewed patient and no TOC needs have been identified at this time. We will continue to monitor patient advancement through interdisciplinary progression rounds. If new patient transition needs arise, please place a TOC consult.

## 2022-05-26 NOTE — Consult Note (Addendum)
Cardiology Consultation   Patient ID: Christopher GrandRodney C Rickett MRN: [id number]; DOB: 02/19/1971  Admit date: 05/25/2022 Date of Consult: 05/26/2022  PCP:  Patient, No Pcp Per   [facility] Providers Cardiologist:  previously seen by Dr. Judithe ModestFolk of [facility]     Patient Profile:   Christopher Green is a 51 y.o. male with a hx of chronic back pain followed by pain clinic, hypertension, hyperlipidemia, tobacco abuse, daily alcohol use, and history of abnormal nuclear stress test with normal cors on cath October 2019 who is being seen 05/26/2022 for the evaluation of second-degree heart block at the request of Dr. Pola Cornahal.  History of Present Illness:   Christopher Green is a 51 year old male with past medical history of chronic back pain followed by pain clinic, hypertension, hyperlipidemia, tobacco abuse, daily alcohol use, and history of abnormal nuclear stress test with minimal plaque on cath October 2019.  Patient was initially [facility] in September 2019 with elevated blood pressure.  Her troponin was elevated at the time.  He was subsequently seen by Dr. Vernona Riegerhomas Folk at [facility].  Myoview obtained on 03/08/2018 was concerning for inferior ischemia.  However subsequent cardiac catheterization performed on 03/25/2018 showed minimal coronary arteries (20% or less).  The stress test was felt to be false positive and likely represent diaphragmatic attenuation artifact, his troponin value elevation was felt to be related to uncontrolled blood pressure.  At the time of last visit, patient was on amlodipine-valsartan (Exforge), Coreg, hydralazine, HCTZ and spironolactone.  He was to follow-up with cardiology service as needed.  Over the years, he was taken off of Exforge and carvedilol.  He has not taken any carvedilol for the past year.  He was kept on 12.5 mg twice a day of hydralazine, 12.5 mg daily of spironolactone and HCTZ.  He has run out of spironolactone and HCTZ  prescription for the past 2 weeks after they lost their insurance.  He is typically followed by pain clinic, he was establish with a family medicine provider and only saw her once.  He [facility] on 05/25/2022 with dyspnea.  He thought he was developing a URI.  On arrival, systolic blood pressure was over 200 and his heart rate was in the 40s at home.  Serial troponin 61--> 82.  CTA of the chest abdomen and pelvis was negative for acute aortic dissection or aneurysm, however did reveal acute segmental and subsegmental PE in the left lower lobe and middle lobe right upper lobe.  Echocardiogram has been obtained and currently pending.  Overnight, patient had both Mobitz 1 and Mobitz 2 heart block.  Cardiology service consulted for advanced heart block.  Past Medical History:  Diagnosis Date   Hypertension     Past Surgical History:  Procedure Laterality Date   CARDIAC CATHETERIZATION  2019   clean cors     Home Medications:  Prior to Admission medications   Medication Sig Start Date End Date Taking? Authorizing Provider  atorvastatin (LIPITOR) 40 MG tablet Take 1 tablet by mouth daily. 03/16/18  Yes [provider]  buprenorphine (BUTRANS) 15 MCG/HR Place 1 patch onto the skin once a week. 05/06/22  Yes [provider]  gabapentin (NEURONTIN) 400 MG capsule Take 1 capsule (400 mg total) by mouth 3 (three) times daily. Patient taking differently: Take 400 mg by mouth daily as needed (for pain). 05/28/21  Yes RegalKirstie Peri, Norman S, DPM  hydrALAZINE (APRESOLINE) 25 MG tablet  Take 25 mg by mouth daily. 03/14/18  Yes [provider]  metFORMIN (GLUCOPHAGE-XR) 500 MG 24 hr tablet Take 500 mg by mouth daily with breakfast. 03/31/21  Yes [provider]  oxyCODONE-acetaminophen (PERCOCET) 10-325 MG tablet Take 1 tablet by mouth every 6 (six) hours as needed for pain. 04/03/21  Yes [provider]  spironolactone (ALDACTONE) 25 MG tablet Take  1 tablet by mouth daily. 02/18/18  Yes [provider]  testosterone cypionate (DEPOTESTOSTERONE CYPIONATE) 200 MG/ML injection Inject 200 mg into the muscle every 14 (fourteen) days. 05/16/21  Yes [provider]    Inpatient Medications: Scheduled Meds:  hydrALAZINE  25 mg Oral BID   potassium chloride  40 mEq Oral Once   sodium chloride flush  3 mL Intravenous Q12H   Continuous Infusions:  heparin 2,200 Units/hr (05/26/22 0541)   PRN Meds: acetaminophen **OR** acetaminophen, hydrALAZINE, ondansetron **OR** ondansetron (ZOFRAN) IV, oxyCODONE, perflutren lipid microspheres (DEFINITY) IV suspension, senna-docusate  Allergies:   No Known Allergies  Social History:   Social History   Socioeconomic History   Marital status: Divorced    Spouse name: Not on file   Number of children: Not on file   Years of education: Not on file   Highest education level: Not on file  Occupational History   Not on file  Tobacco Use   Smoking status: Every Day    Types: Cigarettes   Smokeless tobacco: Never  Substance and Sexual Activity   Alcohol use: Yes    Comment: daily   Drug use: Never   Sexual activity: Not on file  Other Topics Concern   Not on file  Social History Narrative   Not on file   Social Determinants of Health   Financial Resource Strain: Not on file  Food Insecurity: No Food Insecurity (05/25/2022)   Hunger Vital Sign    Worried About Running Out of Food in the Last Year: Never true    Ran Out of Food in the Last Year: Never true  Transportation Needs: No Transportation Needs (05/25/2022)   PRAPARE - Administrator, Civil Service (Medical): No    Lack of Transportation (Non-Medical): No  Physical Activity: Not on file  Stress: Not on file  Social Connections: Not on file  Intimate Partner Violence: Not At Risk (05/25/2022)   Humiliation, Afraid, Rape, and Kick questionnaire    Fear of Current or Ex-Partner: No    Emotionally Abused: No     Physically Abused: No    Sexually Abused: No    Family History:    Family History  Problem Relation Age of Onset   Heart attack Father      ROS:  Please see the history of present illness.   All other ROS reviewed and negative.     Physical Exam/Data:   Vitals:   05/25/22 1846 05/25/22 2025 05/26/22 0029 05/26/22 0534  BP: (!) 173/62 (!) 183/80 (!) 169/72 (!) 185/76  Pulse: 84 88 (!) 57 (!) 52  Resp: Temp:  98.1 F (36.7 C) 98.1 F (36.7 C) 97.8 F (36.6 C)  TempSrc:  Oral Oral Oral  SpO2: 99% 97% 98% 96%  Weight:      Height:        Intake/Output Summary (Last 24 hours) at 05/26/2022 0934 Last data filed at 05/26/2022 0600 Gross per 24 hour  Intake 307.06 ml  Output --  Net 307.06 ml      05/25/2022  10:26 AM  Last 3 Weights  Weight (lbs) 290 lb  Weight (kg) 131.543 kg     Body mass index is 38.26 kg/m.  General:  Well nourished, well developed, in no acute distress HEENT: normal Neck: no JVD Vascular: No carotid bruits; Distal pulses 2+ bilaterally Cardiac:  normal S1, S2; RRR; no murmur  Lungs:  clear to auscultation bilaterally, no wheezing, rhonchi or rales  Abd: soft, nontender, no hepatomegaly  Ext: no edema Musculoskeletal:  No deformities, BUE and BLE strength normal and equal Skin: warm and dry  Neuro:  CNs 2-12 intact, no focal abnormalities noted Psych:  Normal affect   EKG:  The EKG was personally reviewed and demonstrates: Sinus rhythm with Wenckebach Telemetry:  Telemetry was personally reviewed and demonstrates: Sinus rhythm with intermittent Mobitz 1 and Mobitz 2 heart block  Relevant CV Studies:  Cath 03/25/2018 Procedure  Procedure Type   Diagnostic procedure: CARDIAC CATH  Complications: No Complications.  Conclusions  Diagnostic Procedure Summary  INDICATION: Abnormal troponin, abnormal nuclear stress.  1. Mild, nonobstructive CAD.  2. Normal LV systolic function, EF 65%  3. Normal LV regional wall  motion.  Diagnostic Procedure Recommendations  The abnormal troponin was likely secondary to the severely elevated blood  pressure the patient had at the time.  The nuclear stress test result is a false positive, likely due to  diaphragmatic attenuation.  A healthy lifestyle was recommended.  The patient will continue to follow up with his PCP regarding primary  prevention measures.   Signatures   Electronically signed by Tollie Pizza, DO, FACC(Diagnostic   Physician) on 03/25/2018 09:18   Angiographic findings   Cardiac Arteries and Lesion Findings  LMCA: Normal appearance with 0% stenosis.  LAD: Abnormal.  Proximal - 20%  Mid - 20% diffuse  Diagonal - 20%  LCx: Abnormal.  Proximal - 20%  RCA: Abnormal.  Proximal - 20%      Laboratory Data:  High Sensitivity Troponin:   Recent Labs  Lab 05/25/22 1043 05/25/22 1313  TROPONINIHS 61* 82*     Chemistry Recent Labs  Lab 05/25/22 1043 05/25/22 1323 05/26/22 0129  NA 143  --  140  K 3.2*  --  3.2*  CL 102  --  104  CO2 28  --  26  GLUCOSE 143*  --  143*  BUN 11  --  15  CREATININE 0.90  --  0.88  CALCIUM 9.2  --  8.8*  MG  --  1.6* 1.8  GFRNONAA >60  --  >60  ANIONGAP 13  --  10    No results for input(s): "PROT", "ALBUMIN", "AST", "ALT", "ALKPHOS", "BILITOT" in the last 168 hours. Lipids No results for input(s): "CHOL", "TRIG", "HDL", "LABVLDL", "LDLCALC", "CHOLHDL" in the last 168 hours.  Hematology Recent Labs  Lab 05/25/22 1043 05/26/22 0129  WBC 7.2 8.1  RBC 5.37 4.97  HGB 16.3 15.6  HCT 51.3 47.9  MCV 95.5 96.4  MCH 30.4 31.4  MCHC 31.8 32.6  RDW 14.3 14.2  PLT 242 226   Thyroid  Recent Labs  Lab 05/26/22 0129  TSH 1.065    BNP Recent Labs  Lab 05/25/22 1507  BNP 79.2    DDimer No results for input(s): "DDIMER" in the last 168 hours.   Radiology/Studies:  CT Angio Chest/Abd/Pel for Dissection W and/or Wo Contrast  Result Date: 05/25/2022 CLINICAL DATA:  Acute aortic syndrome  suspected. EXAM: CT ANGIOGRAPHY CHEST, ABDOMEN AND PELVIS TECHNIQUE: Non-contrast CT of the  chest was initially obtained. Multidetector CT imaging through the chest, abdomen and pelvis was performed using the standard protocol during bolus administration of intravenous contrast. Multiplanar reconstructed images and MIPs were obtained and reviewed to evaluate the vascular anatomy. RADIATION DOSE REDUCTION: This exam was performed according to the departmental dose-optimization program which includes automated exposure control, adjustment of the mA and/or kV according to patient size and/or use of iterative reconstruction technique. CONTRAST:  OMNIPAQUE IOHEXOL 350 MG/ML SOLN COMPARISON:  None Available. FINDINGS: CTA CHEST FINDINGS Cardiovascular: There is adequate opacification of the thoracic aorta. There is no evidence for aneurysm or dissection. There is mild calcified atherosclerotic disease throughout the aorta. Heart is borderline enlarged. There is no pericardial effusion. There is also opacification of the pulmonary arteries. There are segmental and subsegmental pulmonary emboli within the left lower lobe and right upper lobe. Mediastinum/Nodes: There is an enlarged precarinal lymph node measuring 17 mm short axis. No other enlarged lymph nodes are identified. Visualized thyroid gland and esophagus are within normal limits. Lungs/Pleura: Lungs are clear. No pleural effusion or pneumothorax. Musculoskeletal: No chest wall abnormality. No acute or significant osseous findings. Review of the MIP images confirms the above findings. CTA ABDOMEN AND PELVIS FINDINGS VASCULAR Aorta: Normal caliber aorta without aneurysm, dissection, vasculitis or significant stenosis. There is moderate calcified atherosclerotic disease throughout the aorta. Celiac: Patent without evidence of aneurysm, dissection, vasculitis or significant stenosis. SMA: Patent without evidence of aneurysm, dissection, vasculitis or significant  stenosis. Renals: Both renal arteries are patent without evidence of aneurysm, dissection, vasculitis, fibromuscular dysplasia or significant stenosis. IMA: Patent without evidence of aneurysm, dissection, vasculitis or significant stenosis. Inflow: Patent without evidence of aneurysm, dissection, vasculitis or significant stenosis. Veins: No obvious venous abnormality within the limitations of this arterial phase study. Review of the MIP images confirms the above findings. NON-VASCULAR Hepatobiliary: No focal liver abnormality is seen. No gallstones, gallbladder wall thickening, or biliary dilatation. Pancreas: Unremarkable. No pancreatic ductal dilatation or surrounding inflammatory changes. Spleen: Normal in size without focal abnormality. Adrenals/Urinary Tract: Adrenal glands are unremarkable. Kidneys are normal, without renal calculi, focal lesion, or hydronephrosis. Bladder is unremarkable. Stomach/Bowel: Stomach is within normal limits. Appendix appears normal. No evidence of bowel wall thickening, distention, or inflammatory changes. Lymphatic: No enlarged lymph nodes are seen. Reproductive: Mildly enlarged prostate gland. Other: Small fat containing right inguinal hernia.  No ascites. Musculoskeletal: No acute or significant osseous findings. Review of the MIP images confirms the above findings. IMPRESSION: 1. No evidence for aortic dissection or aneurysm. 2. Acute segmental and subsegmental pulmonary emboli in the left lower lobe and right upper lobe. No evidence for right heart strain. 3. Enlarged precarinal lymph node measuring 17 mm, indeterminate. 4. No acute process in the abdomen or pelvis. Aortic Atherosclerosis (ICD10-I70.0). Electronically Signed   By: Darliss Cheney M.D.   On: 05/25/2022 17:52   DG Chest 1 View  Result Date: 05/25/2022 CLINICAL DATA:  Chest pain.  Shortness of breath. EXAM: CHEST  1 VIEW COMPARISON:  March 25, 2018. FINDINGS: The heart size and mediastinal contours are  within normal limits. Both lungs are clear. The visualized skeletal structures are unremarkable. IMPRESSION: No active disease. Electronically Signed   By: Lupita Raider M.D.   On: 05/25/2022 11:12     Assessment and Plan:   Hypertensive urgency  -Back in 2019, patient was on amlodipine-valsartan (Exforge), carvedilol, hydralazine, HCTZ and spironolactone.  Since then, Exforge and carvedilol has been discontinued over a year ago.  He has been on hydralazine, HCTZ and spironolactone.  Due to loss of insurance, he has been unable to obtain HCTZ and spironolactone for the past 2 weeks.  HCTZ was not restarted during this hospitalization due to hypokalemia.  Will restart hydralazine for now.  Patient has allergy listed with lisinopril, may consider addition of valsartan as outpatient (not on our formulary, may try irbesartan during hospitalization)  Second-degree AV block: Patient had both type I and type II placed on telemetry.  Although carvedilol is listed as home medication, however per patient and his wife, his PCP has taken him off of carvedilol over a year ago.  Avoid any AV nodal blocking agent at this time.  Patient is asymptomatic and denies any recent dizziness, blurred vision or feeling of passing out.  May be related to undiagnosed obstructive sleep apnea.  Will discuss with MD, potentially set up outpatient sleep study and also heart monitor upon discharge.  If significant pauses is present, may also refer the patient to EP service.  Elevation of troponin: In the setting of uncontrolled hypertension.  Previous cardiac catheterization in 2019 showed no clean coronary arteries.  No further workup  Acute PE: Currently on IV heparin, switch to Xarelto VTE dosing  Likely obstructive sleep apnea: Wife noted increased snoring recently.  Hypokalemia: Received both magnesium and potassium supplement.    Dyspnea: Likely related to acute PE.  Pending echocardiogram.   Risk Assessment/Risk  Scores:     TIMI Risk Score for Unstable Angina or Non-ST Elevation MI:   The patient's TIMI risk score is 4, which indicates a 20% risk of all cause mortality, new or recurrent myocardial infarction or need for urgent revascularization in the next 14 days. For questions or updates, please contact [facility] Please consult [email or web address] for contact info under  Signed, Azalee Course, [place]  05/26/2022 9:34 AM   Patient seen and examined, note reviewed with the signed Advanced Practice Provider. I personally reviewed laboratory data, imaging studies and relevant notes. I independently examined the patient and formulated the important aspects of the plan. I have personally discussed the plan with the patient and/or family. Comments or changes to the note/plan are indicated below.  Patient is seen and examined at his bedside. His wife was in the room during my encounter.  He is still hypertensive still, we will optimize his antihypertensive medication as described above.  I reviewed tele - most of this occurred during the night, I suspect this is related to OSA - will need an outpatient sleep study.  Need an ambulatory monitoring in the outpatient setting - he follows with cardiology [facility].   Thomasene Ripple DO, MS [facility] Attending Cardiologist [facility]  [address] [place], [place] [address] [phone] Website: [email or web address]

## 2022-05-26 NOTE — Progress Notes (Signed)
ANTICOAGULATION CONSULT NOTE - Follow Up Consult  Pharmacy Consult for Heparin Indication: pulmonary embolus  No Known Allergies  Patient Measurements: Height: 6\' 1"  (185.4 cm) Weight: 131.5 kg (290 lb) IBW/kg (Calculated) : 79.9 Heparin Dosing Weight: 109 kg  Vital Signs: Temp: 97.8 F (36.6 C) (12/12 0534) Temp Source: Oral (12/12 0534) BP: 185/76 (12/12 0534) Pulse Rate: 52 (12/12 0534)  Labs: Recent Labs    05/25/22 1043 05/25/22 1313 05/25/22 1849 05/26/22 0129  HGB 16.3  --   --  15.6  HCT 51.3  --   --  47.9  PLT 242  --   --  226  APTT  --   --  27  --   LABPROT  --   --  14.1  --   INR  --   --  1.1  --   HEPARINUNFRC  --   --   --  0.19*  CREATININE 0.90  --   --  0.88  TROPONINIHS 61* 82*  --   --     Estimated Creatinine Clearance: 138 mL/min (by C-G formula based on SCr of 0.88 mg/dL).   Medications:  Infusions:   heparin 2,200 Units/hr (05/26/22 0541)    Assessment: 69 yoM presents to ED on 12/11 with ShOB x2 days.  CT + acute segmental and subsegmental pulmonary emboli in the left lower lobe and right upper lobe noted.  No CT evidence of right heart strain.  Pharmacy is consulted to dose Heparin IV.  No prior anticoagulation.    Today, 05/26/2022:  Heparin level 0.39, therapeutic on heparin 2200 units/hr CBC:  Hgb and Plt WNL No bleeding or complications reported.    Goal of Therapy:  Heparin level 0.3-0.7 units/ml Monitor platelets by anticoagulation protocol: Yes   Plan:  Continue heparin IV infusion at 2200 units/hr Confirmatory Heparin level in 6 hours Daily heparin level and CBC Follow up long-term anticoagulation plans.     14/05/2022 PharmD, BCPS [facility] main pharmacy [phone] 05/26/2022 7:24 AM

## 2022-05-27 DIAGNOSIS — F17210 Nicotine dependence, cigarettes, uncomplicated: Secondary | ICD-10-CM | POA: Diagnosis present

## 2022-05-27 DIAGNOSIS — I2510 Atherosclerotic heart disease of native coronary artery without angina pectoris: Secondary | ICD-10-CM | POA: Diagnosis present

## 2022-05-27 DIAGNOSIS — I82442 Acute embolism and thrombosis of left tibial vein: Secondary | ICD-10-CM | POA: Diagnosis present

## 2022-05-27 DIAGNOSIS — Z91148 Patient's other noncompliance with medication regimen for other reason: Secondary | ICD-10-CM | POA: Diagnosis not present

## 2022-05-27 DIAGNOSIS — E1165 Type 2 diabetes mellitus with hyperglycemia: Secondary | ICD-10-CM | POA: Diagnosis present

## 2022-05-27 DIAGNOSIS — Z20822 Contact with and (suspected) exposure to covid-19: Secondary | ICD-10-CM | POA: Diagnosis present

## 2022-05-27 DIAGNOSIS — Z8249 Family history of ischemic heart disease and other diseases of the circulatory system: Secondary | ICD-10-CM | POA: Diagnosis not present

## 2022-05-27 DIAGNOSIS — I160 Hypertensive urgency: Secondary | ICD-10-CM | POA: Diagnosis present

## 2022-05-27 DIAGNOSIS — Z79899 Other long term (current) drug therapy: Secondary | ICD-10-CM | POA: Diagnosis not present

## 2022-05-27 DIAGNOSIS — I10 Essential (primary) hypertension: Secondary | ICD-10-CM | POA: Diagnosis present

## 2022-05-27 DIAGNOSIS — I82452 Acute embolism and thrombosis of left peroneal vein: Secondary | ICD-10-CM | POA: Diagnosis present

## 2022-05-27 DIAGNOSIS — Z7984 Long term (current) use of oral hypoglycemic drugs: Secondary | ICD-10-CM | POA: Diagnosis not present

## 2022-05-27 DIAGNOSIS — E785 Hyperlipidemia, unspecified: Secondary | ICD-10-CM | POA: Diagnosis present

## 2022-05-27 DIAGNOSIS — I2699 Other pulmonary embolism without acute cor pulmonale: Secondary | ICD-10-CM | POA: Diagnosis present

## 2022-05-27 DIAGNOSIS — I119 Hypertensive heart disease without heart failure: Secondary | ICD-10-CM

## 2022-05-27 DIAGNOSIS — I441 Atrioventricular block, second degree: Secondary | ICD-10-CM | POA: Diagnosis present

## 2022-05-27 DIAGNOSIS — E876 Hypokalemia: Secondary | ICD-10-CM | POA: Diagnosis present

## 2022-05-27 DIAGNOSIS — I3139 Other pericardial effusion (noninflammatory): Secondary | ICD-10-CM | POA: Diagnosis present

## 2022-05-27 LAB — BASIC METABOLIC PANEL
Anion gap: 8 (ref 5–15)
BUN: 19 mg/dL (ref 6–20)
CO2: 26 mmol/L (ref 22–32)
Calcium: 8.7 mg/dL — ABNORMAL LOW (ref 8.9–10.3)
Chloride: 105 mmol/L (ref 98–111)
Creatinine, Ser: 0.87 mg/dL (ref 0.61–1.24)
GFR, Estimated: >60 mL/min (ref >60)
Glucose, Bld: 121 mg/dL — ABNORMAL HIGH (ref 70–99)
Potassium: 3.7 mmol/L (ref 3.5–5.1)
Sodium: 139 mmol/L (ref 135–145)

## 2022-05-27 LAB — CBC
HCT: 48.4 % (ref 39.0–52.0)
Hemoglobin: 15.1 g/dL (ref 13.0–17.0)
MCH: 30.7 pg (ref 26.0–34.0)
MCHC: 31.2 g/dL (ref 30.0–36.0)
MCV: 98.4 fL (ref 80.0–100.0)
Platelets: 206 10*3/uL (ref 150–400)
RBC: 4.92 MIL/uL (ref 4.22–5.81)
RDW: 14.1 % (ref 11.5–15.5)
WBC: 9.2 10*3/uL (ref 4.0–10.5)
nRBC: 0 % (ref 0.0–0.2)

## 2022-05-27 LAB — MAGNESIUM: Magnesium: 2 mg/dL (ref 1.7–2.4)

## 2022-05-27 MED ORDER — POTASSIUM CHLORIDE CRYS ER 20 MEQ PO TBCR
40.0000 meq | EXTENDED_RELEASE_TABLET | Freq: Once | ORAL | Status: AC
  Administered 2022-05-27: 40 meq via ORAL
  Filled 2022-05-27: qty 2

## 2022-05-27 MED ORDER — POTASSIUM CHLORIDE 20 MEQ PO PACK
40.0000 meq | PACK | Freq: Once | ORAL | Status: DC
  Filled 2022-05-27: qty 2

## 2022-05-27 MED ORDER — SPIRONOLACTONE 25 MG PO TABS
25.0000 mg | ORAL_TABLET | Freq: Every day | ORAL | Status: DC
  Administered 2022-05-27 – 2022-05-28 (×2): 25 mg via ORAL
  Filled 2022-05-27 (×2): qty 1

## 2022-05-27 NOTE — Progress Notes (Signed)
  Transition of Care [facility]) Screening Note   Patient Details  Name: Christopher Green Date of Birth: 08-09-1970   Transition of Care [facility]) CM/SW Contact:    Lanier Clam, RN Phone Number: 05/27/2022, 10:34 AM    Transition of Care Department [facility]) has reviewed patient and no TOC needs have been identified at this time. We will continue to monitor patient advancement through interdisciplinary progression rounds. If new patient transition needs arise, please place a TOC consult.

## 2022-05-27 NOTE — Plan of Care (Signed)

## 2022-05-27 NOTE — Progress Notes (Addendum)
Reviewed with EP APP. Per his prelim review with Dr. Ladona Ridgel, they do not suspect anything acutely to do, question whether this is due to underlying vagal tone, but they will review further strips and relay any other new recs. Also clarified with Dr. Servando Salina that she would also avoid adding amlodipine as an antihypertensive choice due to potential but small theoretical potential for decreased HR given CCB. (Has not received.) K also 3.7 -> will give another KCl today.

## 2022-05-27 NOTE — Progress Notes (Signed)
Confirmed with EP this afternoon nothing acutely to do with rhythm Reviewed med plan with Dr. Servando Salina - last BP 150/90 - will add spironolactone and plan to increase irbesartan to 150mg  in AM if BP necessitates

## 2022-05-27 NOTE — Progress Notes (Signed)
PROGRESS NOTE  Christopher Green  DOB: 12-03-1970  PCP: Patient, No Pcp Per YQM:[id number]  DOA: 05/25/2022  LOS: 0 days  Hospital Day: 3  Brief narrative: Christopher Green is a 51 y.o. male with PMH significant for HTN, HLD, chronic smoker, smokes about 1 pack/day for last 45 years. 12/11, patient presented to the ED with 3 days history of chest pain, shortness of breath more on exertion.  He skipped his blood pressure medicines in that duration and notices blood pressure over 200 and hence came to the ED He reports similar chest pain in October 2019 and cardiac cath reportedly showed normal coronaries.  Chest pain was attributed to elevated blood pressure.  In the ED blood pressure was elevated 208/89, heart rate 74, afebrile, breathing on room air Labs with troponin elevated to 61 > 82 EKG showed second-degree AV block CT angio chest showed an acute segmental and subsegmental pulmonary emboli in the left lower lobe and right upper lobe without evidence of heart strain. It also noted an indeterminate enlargement of precarinal lymph node measuring 17 mm.  No acute process in the abdomen or pelvis. Patient was started on IV heparin and given IV hydralazine 20 mg.   EDP discussed with cardiologist on-call Admitted to [facility]  Subjective: Patient was seen and examined this morning.   Lying on bed.  Not in distress.  No new symptoms.   Telemetry reviewed with cardiology.  Pending evaluation by EP Labs with CBC unremarkable, potassium low at 3.2.  Assessment and plan: Acute pulmonary embolism without acute cor pulmonale  Acute left lower extremity DVT CTA showed acute segmental and subsegmental PE in the LLL and RUL.  Ultrasound duplex of lower extremities showed acute DVT involving the left posterior tibial veins and left peroneal veins. No CT evidence of right heart strain. Saturating well on room air. Patient was initially started on heparin drip.  Later switched to oral Xarelto. Continue  to monitor on telemetry  Hypertensive urgency Reports intermittent noncompliance to meds.  Skipped meds for 3 days prior to presentation.  Blood pressure elevated over 200 initially.   Cardiology consult appreciated.  Started on irbesartan/and hydralazine.  Continue to monitor.  Elevated troponin Likely because of PE.  Reportedly normal cardiac cath in 2019 Echocardiogram without wall motion abnormality Recent Labs    05/25/22 1043 05/25/22 1313  TROPONINIHS 61* 82*   Second degree AV block EKG appears to show second-degree AV block, Mobitz type I.  Currently hemodynamically stable. Cardiology consulted Beta-blocker avoided.  Hypokalemia/hypomagnesemia Replacement given.  Potassium still low this morning.  Further replacement ordered.. Recent Labs  Lab 05/25/22 1043 05/25/22 1323 05/26/22 0129 05/27/22 1052  K 3.2*  --  3.2* 3.7  MG  --  1.6* 1.8 2.0    Chronically smoker Reports smoking 1 PPD last 45 years.  Counseled to quit  Nicotine patch offered    Hyperlipidemia Continue atorvastatin.   Goals of care   Code Status: Full Code    Mobility: Encourage ambulation  Infusions:     Scheduled Meds:  hydrALAZINE  25 mg Oral BID   irbesartan  75 mg Oral Daily   potassium chloride  40 mEq Oral Once   Rivaroxaban  15 mg Oral BID WC   Followed by   Melene Muller ON 06/16/2022] rivaroxaban  20 mg Oral Q supper   sodium chloride flush  3 mL Intravenous Q12H    PRN meds: acetaminophen **OR** acetaminophen, hydrALAZINE, ondansetron **OR** ondansetron (ZOFRAN) IV, oxyCODONE, senna-docusate,  sodium chloride   Skin assessment:     Nutritional status:  Body mass index is 38.26 kg/m.          Diet:  Diet Order             Diet Heart Room service appropriate? Yes; Fluid consistency: Thin  Diet effective now                   DVT prophylaxis: Oral anticoagulant  Rivaroxaban (XARELTO) tablet 15 mg  rivaroxaban (XARELTO) tablet 20 mg   Antimicrobials:  None Fluid: None Consultants: Cardio Family Communication: Wife at bedside  Status is: Observation  Continue in-hospital care because: Pending EP evaluation today  level of care: Progressive   Dispo: The patient is from: Home              Anticipated d/c is to: Home              Patient currently is not medically stable to d/c.   Difficult to place patient No    Antimicrobials: Anti-infectives (From admission, onward)    None       Objective: Vitals:   05/27/22 0409 05/27/22 1304  BP: (!) 172/77 (!) 150/90  Pulse: (!) 41 72  Resp: 20 (!) 22  Temp: 98.6 F (37 C) 98.2 F (36.8 C)  SpO2: 98% 96%    Intake/Output Summary (Last 24 hours) at 05/27/2022 1356 Last data filed at 05/27/2022 1110 Gross per 24 hour  Intake 441.04 ml  Output --  Net 441.04 ml   Filed Weights   05/25/22 1026  Weight: 131.5 kg   Weight change:  Body mass index is 38.26 kg/m.   Physical Exam: General exam: Pleasant, [name].  Not in physical distress Skin: No rashes, lesions or ulcers. HEENT: Atraumatic, normocephalic, no obvious bleeding Lungs: Clear to auscultate bilaterally CVS: Skipped beats.  Regular rhythm, no murmur GI/Abd soft, nontender, nondistended, bowel sound present CNS: Alert, awake, oriented x 3 Psychiatry: Mood appropriate Extremities: No pedal edema, no calf tenderness  Data Review: I have personally reviewed the laboratory data and studies available.  F/u labs ordered Unresulted Labs (From admission, onward)     Start     Ordered   05/28/22 0500  Hemoglobin A1c  Tomorrow morning,   R       Question:  Specimen collection method  Answer:  Lab=Lab collect   05/27/22 1002   05/28/22 0500  Basic metabolic panel  Tomorrow morning,   R       Question:  Specimen collection method  Answer:  Lab=Lab collect   05/27/22 1003   05/28/22 0500  Magnesium  Tomorrow morning,   R       Question:  Specimen collection method  Answer:  Lab=Lab collect    05/27/22 1003   05/27/22 1018  Aldosterone + renin activity w/ ratio  Once,   R       Question:  Specimen collection method  Answer:  Lab=Lab collect   05/27/22 1017            Signed, Lorin Glass, MD [facility] 05/27/2022

## 2022-05-27 NOTE — Progress Notes (Addendum)
Progress Note  Patient Name: Christopher Green Date of Encounter: 05/27/2022  Primary Cardiologist: Thomasene Ripple, DO  Subjective   Feeling well without complaint, no CP or SOB. No dizziness. Has been up since about 4:30am  Inpatient Medications    Scheduled Meds:  hydrALAZINE  25 mg Oral BID   irbesartan  75 mg Oral Daily   Rivaroxaban  15 mg Oral BID WC   Followed by   Melene Muller ON 06/16/2022] rivaroxaban  20 mg Oral Q supper   sodium chloride flush  3 mL Intravenous Q12H   Continuous Infusions:  PRN Meds: acetaminophen **OR** acetaminophen, hydrALAZINE, ondansetron **OR** ondansetron (ZOFRAN) IV, oxyCODONE, senna-docusate, sodium chloride   Vital Signs    Vitals:   05/26/22 1159 05/26/22 1526 05/26/22 1950 05/27/22 0409  BP: (!) 171/81 (!) 173/78 (!) 174/85 (!) 172/77  Pulse: 73  67 (!) 41  Resp: (!) 21  20 20   Temp: 98.2 F (36.8 C)  98.4 F (36.9 C) 98.6 F (37 C)  TempSrc:   Oral Oral  SpO2: 97%  96% 98%  Weight:      Height:        Intake/Output Summary (Last 24 hours) at 05/27/2022 1015 Last data filed at 05/26/2022 2151 Gross per 24 hour  Intake 438.04 ml  Output --  Net 438.04 ml      05/25/2022   10:26 AM  Last 3 Weights  Weight (lbs) 290 lb  Weight (kg) 131.543 kg     Telemetry    Described below - Personally Reviewed  ECG    No new tracings - Personally Reviewed  Physical Exam   GEN: No acute distress.  HEENT: Normocephalic, atraumatic, sclera non-icteric. Neck: No JVD or bruits. Cardiac: RRR no murmurs, rubs, or gallops.  Respiratory: Clear to auscultation bilaterally. Breathing is unlabored. GI: Soft, nontender, non-distended, BS +x 4. MS: no deformity. Extremities: No clubbing or cyanosis. No edema. Distal pedal pulses are 2+ and equal bilaterally. Neuro:  AAOx3. Follows commands. Psych:  Responds to questions appropriately with a normal affect.  Labs    High Sensitivity Troponin:   Recent Labs  Lab 05/25/22 1043  05/25/22 1313  TROPONINIHS 61* 82*      Cardiac EnzymesNo results for input(s): "TROPONINI" in the last 168 hours. No results for input(s): "TROPIPOC" in the last 168 hours.   Chemistry Recent Labs  Lab 05/25/22 1043 05/26/22 0129  NA 143 140  K 3.2* 3.2*  CL 102 104  CO2 28 26  GLUCOSE 143* 143*  BUN 11 15  CREATININE 0.90 0.88  CALCIUM 9.2 8.8*  GFRNONAA >60 >60  ANIONGAP 13 10     Hematology Recent Labs  Lab 05/25/22 1043 05/26/22 0129 05/27/22 0438  WBC 7.2 8.1 9.2  RBC 5.37 4.97 4.92  HGB 16.3 15.6 15.1  HCT 51.3 47.9 48.4  MCV 95.5 96.4 98.4  MCH 30.4 31.4 30.7  MCHC 31.8 32.6 31.2  RDW 14.3 14.2 14.1  PLT 242 226 206    BNP Recent Labs  Lab 05/25/22 1507  BNP 79.2     DDimer No results for input(s): "DDIMER" in the last 168 hours.   Radiology    VAS 14/11/23 LOWER EXTREMITY VENOUS (DVT)  Result Date: 05/26/2022  Lower Venous DVT Study Patient Name:  LAUREANO HETZER  Date of Exam:   05/26/2022 Medical Rec #: [id number]       Accession #:    [id number] Date of Birth: 11-17-1970  Patient Gender: M Patient Age:   [age] years [facility] Procedure:      VAS US LOWER EXTREMITY VENOUS (DVT) Referring Phys: Lorin GlassBINAYA DAHAL --------------------------------------------------------------------------------  Indications: Pulmonary embolism.  Performing Technologist: Ernestene MentionJody Hill RVT, RDMS  Examination Guidelines: A complete evaluation includes B-mode imaging, spectral Doppler, color Doppler, and power Doppler as needed of all accessible portions of each vessel. Bilateral testing is considered an integral part of a complete examination. Limited examinations for reoccurring indications may be performed as noted. The reflux portion of the exam is performed with the patient in reverse Trendelenburg.  +---------+---------------+---------+-----------+----------+--------------+ RIGHT    CompressibilityPhasicitySpontaneityPropertiesThrombus Aging  +---------+---------------+---------+-----------+----------+--------------+ CFV      Full           Yes      Yes                                 +---------+---------------+---------+-----------+----------+--------------+ SFJ      Full                                                        +---------+---------------+---------+-----------+----------+--------------+ FV Prox  Full           Yes      Yes                                 +---------+---------------+---------+-----------+----------+--------------+ FV Mid   Full           Yes      Yes                                 +---------+---------------+---------+-----------+----------+--------------+ FV DistalFull           Yes      Yes                                 +---------+---------------+---------+-----------+----------+--------------+ PFV      Full                                                        +---------+---------------+---------+-----------+----------+--------------+ POP      Full           Yes      Yes                                 +---------+---------------+---------+-----------+----------+--------------+ PTV      Full                                                        +---------+---------------+---------+-----------+----------+--------------+ PERO     Full                                                        +---------+---------------+---------+-----------+----------+--------------+   +---------+---------------+---------+-----------+----------+--------------+  LEFT     CompressibilityPhasicitySpontaneityPropertiesThrombus Aging +---------+---------------+---------+-----------+----------+--------------+ CFV      Full           Yes      Yes                                 +---------+---------------+---------+-----------+----------+--------------+ SFJ      Full                                                         +---------+---------------+---------+-----------+----------+--------------+ FV Prox  Full           Yes      Yes                                 +---------+---------------+---------+-----------+----------+--------------+ FV Mid   Full           Yes      Yes                                 +---------+---------------+---------+-----------+----------+--------------+ FV DistalFull           Yes      Yes                                 +---------+---------------+---------+-----------+----------+--------------+ PFV      Full                                                        +---------+---------------+---------+-----------+----------+--------------+ POP      Full           Yes      Yes                                 +---------+---------------+---------+-----------+----------+--------------+ PTV      None           No       No                   Acute          +---------+---------------+---------+-----------+----------+--------------+ PERO     None           No       No                   Acute          +---------+---------------+---------+-----------+----------+--------------+     Summary: BILATERAL: -No evidence of popliteal cyst, bilaterally. RIGHT: - There is no evidence of deep vein thrombosis in the lower extremity.  LEFT: - Findings consistent with acute deep vein thrombosis involving the left posterior tibial veins, and left peroneal veins.  *See table(s) above for measurements and observations. Electronically signed by Coral Else MD on 05/26/2022 at 11:22:05 PM.    Final    ECHOCARDIOGRAM COMPLETE  Result Date: 05/26/2022    ECHOCARDIOGRAM REPORT  Patient Name:   IRVAN TIEDT Date of Exam: 05/26/2022 Medical Rec #:  [id number]      Height:       73.0 in Accession #:    [id number]     Weight:       290.0 lb Date of Birth:  [birth year]      BSA:          2.520 m Patient Age:    51 years       BP:           185/76 mmHg Patient Gender: M              HR:            73 bpm. Exam Location:  Inpatient Procedure: 2D Echo, Color Doppler, Cardiac Doppler and Intracardiac            Opacification Agent Indications:    Pulmonary embolus                 Heart block 2nd Degree  History:        Patient has no prior history of Echocardiogram examinations.                 Signs/Symptoms:Dyspnea and Chest Pain; Risk                 Factors:Hypertension, Dyslipidemia and Former Smoker.  Sonographer:    Milda Smart Referring Phys: [id number] VISHAL R PATEL  Sonographer Comments: Technically difficult study due to poor echo windows. Image acquisition challenging due to patient body habitus and Image acquisition challenging due to respiratory motion. IMPRESSIONS  1. Left ventricular ejection fraction, by estimation, is 65 to 70%. The left ventricle has normal function. The left ventricle has no regional wall motion abnormalities. The left ventricular internal cavity size was mildly dilated. 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 Left ventricular diastolic parameters are indeterminate.  2. Right ventricular systolic function is normal. The right ventricular size is normal. Tricuspid regurgitation signal is inadequate for assessing PA pressure.  3. Left atrial size was mildly dilated.  4. A small pericardial effusion is present. The pericardial effusion is posterior to the left ventricle. There is no evidence of cardiac tamponade.  5. The mitral valve is normal in structure. No evidence of mitral valve regurgitation. No evidence of mitral stenosis.  6. The aortic valve is tricuspid. There is mild calcification of the aortic valve. Aortic valve regurgitation is not visualized.  7. The inferior vena cava is normal in size with greater than 50% respiratory variability, suggesting right atrial pressure of 3 mmHg. Comparison(s): No prior Echocardiogram. Conclusion(s)/Recommendation(s): There is 2:1 heart block seen throughout imaging. CMR may be  considered if hypertrophy is not due to long standing hypertension. FINDINGS  Left Ventricle: Septal thickness 2.44 on PLAX view, 2.0 cm on PSAX. Left ventricular ejection fraction, by estimation, is 65 to 70%. The left ventricle has normal function. The left ventricle has no regional wall motion abnormalities. Definity contrast agent was given IV to delineate the left ventricular endocardial borders. The left ventricular internal cavity size was mildly dilated. 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 Left ventricular diastolic parameters are indeterminate. Right Ventricle: The right ventricular size is normal. No increase in right ventricular wall thickness. Right ventricular systolic function is normal. Tricuspid regurgitation signal is inadequate for assessing PA pressure. Left Atrium: Left atrial size was mildly dilated. Right Atrium: Right atrial size was  normal in size. Pericardium: A small pericardial effusion is present. The pericardial effusion is posterior to the left ventricle. There is no evidence of cardiac tamponade. Mitral Valve: The mitral valve is normal in structure. No evidence of mitral valve regurgitation. No evidence of mitral valve stenosis. Tricuspid Valve: The tricuspid valve is normal in structure. Tricuspid valve regurgitation is not demonstrated. No evidence of tricuspid stenosis. Aortic Valve: The aortic valve is tricuspid. There is mild calcification of the aortic valve. Aortic valve regurgitation is not visualized. Pulmonic Valve: The pulmonic valve was not well visualized. Pulmonic valve regurgitation is trivial. No evidence of pulmonic stenosis. Aorta: The aortic root and ascending aorta are structurally normal, with no evidence of dilitation. Venous: The inferior vena cava is normal in size with greater than 50% respiratory variability, suggesting right atrial pressure of 3 mmHg. IAS/Shunts: No atrial level shunt detected by color flow Doppler.   LEFT VENTRICLE PLAX 2D LVIDd:         6.10 cm   Diastology LVIDs:         3.80 cm   LV e' medial:    10.20 cm/s LV PW:         1.10 cm   LV E/e' medial:  12.5 LV IVS:        1.00 cm   LV e' lateral:   14.90 cm/s LVOT diam:     2.60 cm   LV E/e' lateral: 8.5 LV SV:         143 LV SV Index:   57 LVOT Area:     5.31 cm  RIGHT VENTRICLE             IVC RV S prime:     17.10 cm/s  IVC diam: 2.40 cm TAPSE (M-mode): 2.2 cm LEFT ATRIUM           Index        RIGHT ATRIUM           Index LA diam:      4.70 cm 1.87 cm/m   RA Area:     20.80 cm LA Vol (A4C): 83.9 ml 33.30 ml/m  RA Volume:   61.90 ml  24.57 ml/m  AORTIC VALVE LVOT Vmax:   122.00 cm/s LVOT Vmean:  98.600 cm/s LVOT VTI:    0.269 m  AORTA Ao Root diam: 3.40 cm Ao Asc diam:  3.50 cm MITRAL VALVE MV Area (PHT): 3.70 cm     SHUNTS MV Decel Time: 205 msec     Systemic VTI:  0.27 m MV E velocity: 127.00 cm/s  Systemic Diam: 2.60 cm Riley Lam MD Electronically signed by Riley Lam MD Signature Date/Time: 05/26/2022/9:58:35 AM    Final    CT Angio Chest/Abd/Pel for Dissection W and/or Wo Contrast  Result Date: 05/25/2022 CLINICAL DATA:  Acute aortic syndrome suspected. EXAM: CT ANGIOGRAPHY CHEST, ABDOMEN AND PELVIS TECHNIQUE: Non-contrast CT of the chest was initially obtained. Multidetector CT imaging through the chest, abdomen and pelvis was performed using the standard protocol during bolus administration of intravenous contrast. Multiplanar reconstructed images and MIPs were obtained and reviewed to evaluate the vascular anatomy. RADIATION DOSE REDUCTION: This exam was performed according to the departmental dose-optimization program which includes automated exposure control, adjustment of the mA and/or kV according to patient size and/or use of iterative reconstruction technique. CONTRAST:  OMNIPAQUE IOHEXOL 350 MG/ML SOLN COMPARISON:  None Available. FINDINGS: CTA CHEST FINDINGS Cardiovascular: There is adequate opacification  of the thoracic aorta. There  is no evidence for aneurysm or dissection. There is mild calcified atherosclerotic disease throughout the aorta. Heart is borderline enlarged. There is no pericardial effusion. There is also opacification of the pulmonary arteries. There are segmental and subsegmental pulmonary emboli within the left lower lobe and right upper lobe. Mediastinum/Nodes: There is an enlarged precarinal lymph node measuring 17 mm short axis. No other enlarged lymph nodes are identified. Visualized thyroid gland and esophagus are within normal limits. Lungs/Pleura: Lungs are clear. No pleural effusion or pneumothorax. Musculoskeletal: No chest wall abnormality. No acute or significant osseous findings. Review of the MIP images confirms the above findings. CTA ABDOMEN AND PELVIS FINDINGS VASCULAR Aorta: Normal caliber aorta without aneurysm, dissection, vasculitis or significant stenosis. There is moderate calcified atherosclerotic disease throughout the aorta. Celiac: Patent without evidence of aneurysm, dissection, vasculitis or significant stenosis. SMA: Patent without evidence of aneurysm, dissection, vasculitis or significant stenosis. Renals: Both renal arteries are patent without evidence of aneurysm, dissection, vasculitis, fibromuscular dysplasia or significant stenosis. IMA: Patent without evidence of aneurysm, dissection, vasculitis or significant stenosis. Inflow: Patent without evidence of aneurysm, dissection, vasculitis or significant stenosis. Veins: No obvious venous abnormality within the limitations of this arterial phase study. Review of the MIP images confirms the above findings. NON-VASCULAR Hepatobiliary: No focal liver abnormality is seen. No gallstones, gallbladder wall thickening, or biliary dilatation. Pancreas: Unremarkable. No pancreatic ductal dilatation or surrounding inflammatory changes. Spleen: Normal in size without focal abnormality. Adrenals/Urinary Tract: Adrenal glands  are unremarkable. Kidneys are normal, without renal calculi, focal lesion, or hydronephrosis. Bladder is unremarkable. Stomach/Bowel: Stomach is within normal limits. Appendix appears normal. No evidence of bowel wall thickening, distention, or inflammatory changes. Lymphatic: No enlarged lymph nodes are seen. Reproductive: Mildly enlarged prostate gland. Other: Small fat containing right inguinal hernia.  No ascites. Musculoskeletal: No acute or significant osseous findings. Review of the MIP images confirms the above findings. IMPRESSION: 1. No evidence for aortic dissection or aneurysm. 2. Acute segmental and subsegmental pulmonary emboli in the left lower lobe and right upper lobe. No evidence for right heart strain. 3. Enlarged precarinal lymph node measuring 17 mm, indeterminate. 4. No acute process in the abdomen or pelvis. Aortic Atherosclerosis (ICD10-I70.0). Electronically Signed   By: Darliss Cheney M.D.   On: 05/25/2022 17:52   DG Chest 1 View  Result Date: 05/25/2022 CLINICAL DATA:  Chest pain.  Shortness of breath. EXAM: CHEST  1 VIEW COMPARISON:  March 25, 2018. FINDINGS: The heart size and mediastinal contours are within normal limits. Both lungs are clear. The visualized skeletal structures are unremarkable. IMPRESSION: No active disease. Electronically Signed   By: Lupita Raider M.D.   On: 05/25/2022 11:12    Cardiac Studies   2D echo 05/26/22   1. Left ventricular ejection fraction, by estimation, is 65 to 70%. The  left ventricle has normal function. The left ventricle has no regional  wall motion abnormalities. The left ventricular internal cavity size was  mildly dilated. There is severe  asymmetric left ventricular hypertrophy of the septal segment. Left  ventricular diastolic parameters are indeterminate.   2. Right ventricular systolic function is normal. The right ventricular  size is normal. Tricuspid regurgitation signal is inadequate for assessing  PA pressure.    3. Left atrial size was mildly dilated.   4. A small pericardial effusion is present. The pericardial effusion is  posterior to the left ventricle. There is no evidence of cardiac  tamponade.   5. The mitral valve is  normal in structure. No evidence of mitral valve  regurgitation. No evidence of mitral stenosis.   6. The aortic valve is tricuspid. There is mild calcification of the  aortic valve. Aortic valve regurgitation is not visualized.   7. The inferior vena cava is normal in size with greater than 50%  respiratory variability, suggesting right atrial pressure of 3 mmHg.   Comparison(s): No prior Echocardiogram   Patient Profile     51 y.o. male with chronic back pain followed by pain clinic, hypertension, hyperlipidemia, tobacco abuse, daily alcohol use, and history of abnormal nuclear stress test with normal cors on cath October 2019 (mildly elevated troponin felt due to high BP) who is being seen 05/26/2022 for the evaluation of second-degree heart block at the request of Dr. Pola Corn. He presented to the hospital with worsening dyspnea, thought he had a UTI. On arrival SBP>200 and HR 40s. Troponin peaked at 82. CTA of the chest abdomen and pelvis was negative for acute aortic dissection or aneurysm, however did reveal acute segmental and subsegmental PE in the left lower lobe and middle lobe right upper lobe. Patient had both Mobitz 1 and Mobitz 2 AVB on telemetry therefore cardiology consulted. Had been out of HCTZ and spironolactone for 2 weeks.  Assessment & Plan    1. Hypertensive urgency - back in 2019, patient was on amlodipine-valsartan (Exforge), carvedilol, hydralazine, HCTZ and spironolactone.  Since then, Exforge and carvedilol has been discontinued over a year ago.  He has been on hydralazine, HCTZ and spironolactone.  Due to loss of insurance, he has been unable to obtain HCTZ and spironolactone for the past 2 weeks - r/s on irbesartan , hydralazine  BID here - for  compliance sake question whether amlodipine / spironolactone would be better option than hydralazine due to once daily dosing - avoid thiazide diuretics with hypokalemia  2. Second degree AV block, type 1 and type 2, RBBB+LAFB - TSH OK - was reported as primarily nocturnal and therefore felt possibly r/t undiagnosed OSA, will need OP sleep study arranged in follow-up - however, also seeing episodes while awake as well, fortunately asymptomatic - but with Mobitz 1, Mobitz 2, possible brief CHB with HR upper 30s, ectopic atrial rhythm/?tachycardia with 2:1 block - no high risk pre-syncope, syncope reported - avoid AVN blocking agents - per Dr. Servando Salina we'll ask EP to formally weigh in   3. Abnormal echo  - echo showing EF 65-70%, severe asymmetric LVH of the septal segment, normal RV, small pericardial effusion without significant tamponade -> will review with MD whether the LVH is felt 2/2 HTN versus needing OP consideration of cMRI to eval for HCM  4. Elevated troponin suspected due to demand ischemia from HTN/PE (61->82) - no further ischemic w/u planned given known normal cors 2019 and echo without significant LV dysfunction  5. Acute PE - anticoag per IM - will need to f/u PCP for duration and ongoing management  6. Suspected OSA/sleep disordered breathing - will need outpatient sleep study  7. Hypokalemia/hypomagnesemia - no labs yet today, will order, otherwise electrolyte management per primary team - will send renin/aldo level since patient has been off spiro, though may have some effect from ARB  8. Hyperglycemia - will check A1C with AM labs otherwise per medicine team  For questions or updates, please contact [facility] Please consult [email or web address] for contact info under Cardiology/STEMI.  Signed, Laurann Montana, PA-C 05/27/2022, 10:15 AM    Patient seen and examined, note reviewed  with the signed Advanced Practice Provider. I personally reviewed laboratory data,  imaging studies and relevant notes. I independently examined the patient and formulated the important aspects of the plan. I have personally discussed the plan with the patient and/or family. Comments or changes to the note/plan are indicated below.  Patient seen and examined at his bedside. Remains hypertensive - increased irbesartan to 150 mg daily, add home aldactone 25 mg daily.  Need to follow up closely with cardiology for blood pressure and conduction disease  PCP to manage [facility] for pulmonary embolism.  No need for further ischemic evaluation .   Thomasene Ripple DO, MS [facility] Attending Cardiologist [facility]  [address] [place], [place] [address] [phone] Website: [email or web address]

## 2022-05-28 DIAGNOSIS — I2699 Other pulmonary embolism without acute cor pulmonale: Secondary | ICD-10-CM | POA: Diagnosis not present

## 2022-05-28 LAB — URINALYSIS, ROUTINE W REFLEX MICROSCOPIC
Bilirubin Urine: NEGATIVE
Glucose, UA: 50 mg/dL — AB
Hgb urine dipstick: NEGATIVE
Ketones, ur: NEGATIVE mg/dL
Nitrite: NEGATIVE
Protein, ur: NEGATIVE mg/dL
Specific Gravity, Urine: 1.018 (ref 1.005–1.030)
pH: 7 (ref 5.0–8.0)

## 2022-05-28 LAB — BASIC METABOLIC PANEL
Anion gap: 6 (ref 5–15)
BUN: 20 mg/dL (ref 6–20)
CO2: 26 mmol/L (ref 22–32)
Calcium: 8.6 mg/dL — ABNORMAL LOW (ref 8.9–10.3)
Chloride: 107 mmol/L (ref 98–111)
Creatinine, Ser: 0.85 mg/dL (ref 0.61–1.24)
GFR, Estimated: >60 mL/min (ref >60)
Glucose, Bld: 121 mg/dL — ABNORMAL HIGH (ref 70–99)
Potassium: 3.8 mmol/L (ref 3.5–5.1)
Sodium: 139 mmol/L (ref 135–145)

## 2022-05-28 LAB — HEMOGLOBIN A1C
Hgb A1c MFr Bld: 6.4 % — ABNORMAL HIGH (ref 4.8–5.6)
Mean Plasma Glucose: 137 mg/dL

## 2022-05-28 LAB — MAGNESIUM: Magnesium: 2.2 mg/dL (ref 1.7–2.4)

## 2022-05-28 MED ORDER — ORAL CARE MOUTH RINSE: 15.0000 mL | OROMUCOSAL | Status: DC | PRN

## 2022-05-28 MED ORDER — IRBESARTAN 150 MG PO TABS
150.0000 mg | ORAL_TABLET | Freq: Every day | ORAL | Status: DC
  Administered 2022-05-28: 150 mg via ORAL
  Filled 2022-05-28: qty 1

## 2022-05-28 MED ORDER — SPIRONOLACTONE 25 MG PO TABS: 25.0000 mg | ORAL_TABLET | Freq: Every day | ORAL | 2 refills | Status: DC

## 2022-05-28 MED ORDER — ATORVASTATIN CALCIUM 40 MG PO TABS: 40.0000 mg | ORAL_TABLET | Freq: Every day | ORAL | 2 refills | Status: DC

## 2022-05-28 MED ORDER — HYDRALAZINE HCL 25 MG PO TABS: 25.0000 mg | ORAL_TABLET | Freq: Two times a day (BID) | ORAL | 2 refills | Status: DC

## 2022-05-28 MED ORDER — VALSARTAN 320 MG PO TABS: 320.0000 mg | ORAL_TABLET | Freq: Every day | ORAL | 2 refills | Status: DC

## 2022-05-28 MED ORDER — RIVAROXABAN (XARELTO) VTE STARTER PACK (15 & 20 MG): ORAL_TABLET | ORAL | 0 refills | Status: DC

## 2022-05-28 NOTE — Progress Notes (Signed)
AVS and discharge instructions reviewed w/ patient. Patient verbalized understanding and had no further questions. Patient called transportation for self to home.

## 2022-05-28 NOTE — Progress Notes (Addendum)
Progress Note  Patient Name: Christopher Green Date of Encounter: 05/28/2022  Primary Cardiologist: Godfrey Pick Tunisha Ruland, DO  Subjective   No chest pain, SOB, dizziness, palpitations, or syncope. Has a headache.  Inpatient Medications    Scheduled Meds:  hydrALAZINE  25 mg Oral BID   irbesartan  75 mg Oral Daily   Rivaroxaban  15 mg Oral BID WC   Followed by   [name] ON 06/16/2022] rivaroxaban  20 mg Oral Q supper   sodium chloride flush  3 mL Intravenous Q12H   spironolactone  25 mg Oral Daily   Continuous Infusions:  PRN Meds: acetaminophen **OR** acetaminophen, hydrALAZINE, ondansetron **OR** ondansetron (ZOFRAN) IV, mouth rinse, oxyCODONE, senna-docusate, sodium chloride   Vital Signs    Vitals:   05/27/22 2227 05/27/22 2229 05/28/22 0447 05/28/22 0613  BP: (!) 188/82 (!) 175/93 (!) 194/91 (!) 172/80  Pulse: 61 62 66 (!) 56  Resp:   20   Temp:   97.9 F (36.6 C)   TempSrc:   Oral   SpO2:   99%   Weight:      Height:        Intake/Output Summary (Last 24 hours) at 05/28/2022 0926 Last data filed at 05/27/2022 1110 Gross per 24 hour  Intake 3 ml  Output --  Net 3 ml      05/25/2022   10:26 AM  Last 3 Weights  Weight (lbs) 290 lb  Weight (kg) 131.543 kg     Telemetry    Described below - Personally Reviewed  ECG    SB, RBBB, LAFB, first degree AVB 62bpm - Personally Reviewed  Physical Exam   GEN: No acute distress.  HEENT: Normocephalic, atraumatic, sclera non-icteric. Neck: No JVD or bruits. Cardiac: RRR no murmurs, rubs, or gallops.  Respiratory: Clear to auscultation bilaterally. Breathing is unlabored. GI: Soft, nontender, non-distended, BS +x 4. MS: no deformity. Extremities: No clubbing or cyanosis. No edema. Distal pedal pulses are 2+ and equal bilaterally. Neuro:  AAOx3. Follows commands. Psych:  Responds to questions appropriately with a normal affect.  Labs    High Sensitivity Troponin:   Recent Labs  Lab 05/25/22 1043 05/25/22 1313   TROPONINIHS 61* 82*      Cardiac EnzymesNo results for input(s): "TROPONINI" in the last 168 hours. No results for input(s): "TROPIPOC" in the last 168 hours.   Chemistry Recent Labs  Lab 05/26/22 0129 05/27/22 1052 05/28/22 0435  NA 140 139 139  K 3.2* 3.7 3.8  CL 104 105 107  CO2 _0 GLUCOSE 143* 121* 121*  BUN _1 CREATININE 0.88 0.87 0.85  CALCIUM 8.8* 8.7* 8.6*  GFRNONAA >60 >60 >60  ANIONGAP _2 Hematology Recent Labs  Lab 05/25/22 1043 05/26/22 0129 05/27/22 0438  WBC 7.2 8.1 9.2  RBC 5.37 4.97 4.92  HGB 16.3 15.6 15.1  HCT 51.3 47.9 48.4  MCV 95.5 96.4 98.4  MCH 30.4 31.4 30.7  MCHC 31.8 32.6 31.2  RDW 14.3 14.2 14.1  PLT 242 226 206    BNP Recent Labs  Lab 05/25/22 1507  BNP 79.2     DDimer No results for input(s): "DDIMER" in the last 168 hours.   Radiology    VAS [place] LOWER EXTREMITY VENOUS (DVT)  Result Date: 05/26/2022  Lower Venous DVT Study Patient Name:  Christopher Green  Date of Exam:   05/26/2022 Medical Rec #: [id number]       Accession #:  [id number] Date of Birth: Feb 05, 1971       Patient Gender: M Patient Age:   25 years Exam Location:  [facility] Procedure:      VAS [place] LOWER EXTREMITY VENOUS (DVT) Referring Phys: Terrilee Croak --------------------------------------------------------------------------------  Indications: Pulmonary embolism.  Performing Technologist: Rogelia Rohrer RVT, RDMS  Examination Guidelines: A complete evaluation includes B-mode imaging, spectral Doppler, color Doppler, and power Doppler as needed of all accessible portions of each vessel. Bilateral testing is considered an integral part of a complete examination. Limited examinations for reoccurring indications may be performed as noted. The reflux portion of the exam is performed with the patient in reverse Trendelenburg.  +---------+---------------+---------+-----------+----------+--------------+ RIGHT     CompressibilityPhasicitySpontaneityPropertiesThrombus Aging +---------+---------------+---------+-----------+----------+--------------+ CFV      Full           Yes      Yes                                 +---------+---------------+---------+-----------+----------+--------------+ SFJ      Full                                                        +---------+---------------+---------+-----------+----------+--------------+ FV Prox  Full           Yes      Yes                                 +---------+---------------+---------+-----------+----------+--------------+ FV Mid   Full           Yes      Yes                                 +---------+---------------+---------+-----------+----------+--------------+ FV DistalFull           Yes      Yes                                 +---------+---------------+---------+-----------+----------+--------------+ PFV      Full                                                        +---------+---------------+---------+-----------+----------+--------------+ POP      Full           Yes      Yes                                 +---------+---------------+---------+-----------+----------+--------------+ PTV      Full                                                        +---------+---------------+---------+-----------+----------+--------------+ PERO     Full                                                        +---------+---------------+---------+-----------+----------+--------------+   +---------+---------------+---------+-----------+----------+--------------+  LEFT     CompressibilityPhasicitySpontaneityPropertiesThrombus Aging +---------+---------------+---------+-----------+----------+--------------+ CFV      Full           Yes      Yes                                 +---------+---------------+---------+-----------+----------+--------------+ SFJ      Full                                                         +---------+---------------+---------+-----------+----------+--------------+ FV Prox  Full           Yes      Yes                                 +---------+---------------+---------+-----------+----------+--------------+ FV Mid   Full           Yes      Yes                                 +---------+---------------+---------+-----------+----------+--------------+ FV DistalFull           Yes      Yes                                 +---------+---------------+---------+-----------+----------+--------------+ PFV      Full                                                        +---------+---------------+---------+-----------+----------+--------------+ POP      Full           Yes      Yes                                 +---------+---------------+---------+-----------+----------+--------------+ PTV      None           No       No                   Acute          +---------+---------------+---------+-----------+----------+--------------+ PERO     None           No       No                   Acute          +---------+---------------+---------+-----------+----------+--------------+     Summary: BILATERAL: -No evidence of popliteal cyst, bilaterally. RIGHT: - There is no evidence of deep vein thrombosis in the lower extremity.  LEFT: - Findings consistent with acute deep vein thrombosis involving the left posterior tibial veins, and left peroneal veins.  *See table(s) above for measurements and observations. Electronically signed by Harold Barban MD on 05/26/2022 at 11:22:05 PM.    Final     Cardiac Studies    2D echo 05/26/22  1. Left ventricular ejection fraction, by estimation, is 65 to 70%. The  left ventricle has normal function. The left ventricle has no regional  wall motion abnormalities. The left ventricular internal cavity size was  mildly dilated. There is severe  asymmetric left ventricular hypertrophy of the septal segment. Left   ventricular diastolic parameters are indeterminate.   2. Right ventricular systolic function is normal. The right ventricular  size is normal. Tricuspid regurgitation signal is inadequate for assessing  PA pressure.   3. Left atrial size was mildly dilated.   4. A small pericardial effusion is present. The pericardial effusion is  posterior to the left ventricle. There is no evidence of cardiac  tamponade.   5. The mitral valve is normal in structure. No evidence of mitral valve  regurgitation. No evidence of mitral stenosis.   6. The aortic valve is tricuspid. There is mild calcification of the  aortic valve. Aortic valve regurgitation is not visualized.   7. The inferior vena cava is normal in size with greater than 50%  respiratory variability, suggesting right atrial pressure of 3 mmHg.   Comparison(s): No prior Echocardiogram   Patient Profile     51 y.o. male with chronic back pain followed by pain clinic, hypertension, hyperlipidemia, tobacco abuse, daily alcohol use, and history of abnormal nuclear stress test with normal cors on cath October 2019 (mildly elevated troponin felt due to high BP) who is being seen 05/26/2022 for the evaluation of second-degree heart block at the request of Dr. Pietro Cassis. He presented to the hospital with worsening dyspnea, thought he had a UTI. On arrival SBP>200 and HR 40s. Troponin peaked at 82. CTA of the chest abdomen and pelvis was negative for acute aortic dissection or aneurysm, however did reveal acute segmental and subsegmental PE in the left lower lobe and middle lobe right upper lobe. Patient had both Mobitz 1 and Mobitz 2 AVB on telemetry therefore cardiology consulted. Had been out of HCTZ and spironolactone for 2 weeks.   Assessment & Plan    1. Hypertensive urgency - back in 2019, patient was on amlodipine-valsartan (Exforge), carvedilol, hydralazine, HCTZ and spironolactone.  Since then, Exforge and carvedilol has been discontinued over a  year ago.  He has been on hydralazine, HCTZ and spironolactone.  Due to loss of insurance, he has been unable to obtain HCTZ and spironolactone for the past 2 weeks - r/s on irbesartan 88m, hydralazine 28mBID - spironolactone added 12/13 - will titrate irbesartan to 15063maily - may need to go directly to 300m53mily given prior regimen, will review with MD - renin/aldo level pending - TSH OK - get UA to look for proteinuria - will need sleep study arranged at OP f/u - consider renal artery duplex if HTN persists despite management above - avoid thiazide diuretics with hypokalemia - Dr. TobbHarriet Massongests to avoid amlodipine with conduction disease given theoretical small risk of worsening this   2. Second degree AV block, type 1 and type 2, RBBB+LAFB - TSH OK - predominantly NSR and 2nd degree AVB type 1 while awake; higher grade type 2/possible brief CHB during sleeping hours, also appears to have an ectopic atrial rhythm from different focus with higher atrial rate and intermittent 2:1 block though HR 70s during this time, some wandering pacemaker as well - no high risk pre-syncope, syncope reported - d/w EP - may be related to untreated OSA, vagal tone - given asymptomatic nature they do not feel acute intervention or PPM  needed at this time, and they do not recommend monitor given frequency of events seen on telemetry - will request f/u to establish with EP as outpatient (msg sent to EP scheduler) - we did discuss this puts him at increased risk of needing PPM in future; reviewed warning signs/symptoms and monitoring HR at home   3. Abnormal echo  - echo showing EF 65-70%, severe asymmetric LVH of the septal segment, normal RV, small pericardial effusion without significant tamponade -> per d/w Dr. Harriet Masson, LVH is felt more likely related to HTN and she would not suggest cMRI at this time  4. Elevated troponin suspected due to demand ischemia from HTN/PE (61->82) - no further ischemic w/u  planned given known normal cors 2019 and echo without significant LV dysfunction   5. Acute PE - anticoag per IM - will need to f/u PCP for duration and ongoing management   6. Suspected OSA/sleep disordered breathing - will need outpatient sleep study arranged at f/u OV - may be candidate for [place] given smart phone   7. Hypokalemia/hypomagnesemia - improved, follow on spironolactone   8. Hyperglycemia - A1C pending  Pt would like to follow up with [place] in [place]. Tentatively arranged this 12/21 with Almyra Deforest in [place] office whom he met in consult. Otherwise sent msg to EP scheduler to arrange EP f/u.  For questions or updates, please contact [place] Please consult [email or web address] for contact info under Cardiology/STEMI.  Signed, Charlie Pitter, PA-C 05/28/2022, 9:26 AM     Patient seen and examined, note reviewed with the signed Advanced Practice Provider. I personally reviewed laboratory data, imaging studies and relevant notes. I independently examined the patient and formulated the important aspects of the plan. I have personally discussed the plan with the patient and/or family. Comments or changes to the note/plan are indicated below.   Patient seen and examined at his bedside.  Remains hypertensive with made some adjustments to his regimen: Currently on irbesartan 150 mg daily-in the discharge put the patient on 360 mg of valsartan, Aldactone 25 mg daily, hydralazine 25 mg twice daily.  Need to follow up closely with cardiology for blood pressure and conduction disease-follow-up has been planned for 12/21 in our [facility] office. PCP to manage [facility] for pulmonary embolism.  No need for further ischemic evaluation   Berniece Salines DO, MS [facility] Attending [place]  [address] [place], [place] [address] [phone] Website: [email or web address]

## 2022-05-28 NOTE — Plan of Care (Signed)
  Problem: Education: Goal: Knowledge of General Education information will improve Description: Including pain rating scale, medication(s)/side effects and non-pharmacologic comfort measures Outcome: Progressing   Problem: Health Behavior/Discharge Planning: Goal: Ability to manage health-related needs will improve Outcome: Progressing   Problem: Clinical Measurements: Goal: Cardiovascular complication will be avoided Outcome: Progressing   Problem: Activity: Goal: Risk for activity intolerance will decrease Outcome: Progressing   Problem: Pain Managment: Goal: General experience of comfort will improve Outcome: Progressing   

## 2022-05-28 NOTE — Discharge Summary (Signed)
Physician Discharge Summary  Christopher Green AVW:[id number] DOB: 11/09/1970 DOA: 05/25/2022  PCP: Patient, No Pcp Per  Admit date: 05/25/2022 Discharge date: 05/28/2022  Admitted From: Home Discharge disposition: Home  Recommendations at discharge:  You been started on Xarelto which is a blood thinner.  Please watch out for any evidence of bleeding or excessive bruisability.  He will need Xarelto for at least 6 months.  Follow-up with PCP for determination on long-term need. Follow-up with cardiology as an outpatient for blood pressure management and further cardiac workup   Brief narrative: Christopher Green is a 51 y.o. male with PMH significant for HTN, HLD, chronic smoker, smokes about 1 pack/day for last 45 years. 12/11, patient presented to the ED with 3 days history of chest pain, shortness of breath more on exertion.  He skipped his blood pressure medicines in that duration and notices blood pressure over 200 and hence came to the ED He reports similar chest pain in October 2019 and cardiac cath reportedly showed normal coronaries.  Chest pain was attributed to elevated blood pressure.  In the ED blood pressure was elevated 208/89, heart rate 74, afebrile, breathing on room air Labs with troponin elevated to 61 > 82 EKG showed second-degree AV block CT angio chest showed an acute segmental and subsegmental pulmonary emboli in the left lower lobe and right upper lobe without evidence of heart strain. It also noted an indeterminate enlargement of precarinal lymph node measuring 17 mm.  No acute process in the abdomen or pelvis. Patient was started on IV heparin and given IV hydralazine 20 mg.   EDP discussed with cardiologist on-call [facility]  Subjective: Patient was seen and examined this morning.   Lying on bed.  Not in distress.  No new symptoms.   Cardiology follow-up appreciated.  Patient comfortable with plan of discharge home today.  Hospital course: Acute  pulmonary embolism without acute cor pulmonale  Acute left lower extremity DVT CTA showed acute segmental and subsegmental PE in the LLL and RUL.  Ultrasound duplex of lower extremities showed acute DVT involving the left posterior tibial veins and left peroneal veins. No CT evidence of right heart strain. Saturating well on room air. Patient was initially started on heparin drip.  Later switched to oral Xarelto. Since this is his first occurrence of VTE, anticoagulation is recommended for at least 6 months.  Referred to case PCP for long-term anticoagulation need.  Hypertensive urgency Uncontrolled hypertension.  Noncompliance to medicine as an outpatient. Blood pressure was elevated consistently over 170s.  Cardiology consultation was obtained.  Adjustments made.  Per cardiology recommendation, will discharge the patient on valsartan 360 mg daily, Aldactone 25 mg daily, hydralazine 25 mg twice daily. Follow-up with cardiology as an outpatient.  Elevated troponin Likely because of PE.  Reportedly normal cardiac cath in 2019 Echocardiogram without wall motion abnormality Per cardiology, no need of ischemic evaluation at this time. Recent Labs    05/25/22 1313  TROPONINIHS 82*   Second degree AV block EKG appears to show second-degree AV block, Mobitz type I.  Currently hemodynamically stable. Cardiology consulted Beta-blocker avoided. Patient may eventually need a pacemaker.  Will be seen by EP as an outpatient.  Type 2 diabetes mellitus On metformin at home.  Hypokalemia/hypomagnesemia Improved with replacement. Recent Labs  Lab 05/25/22 1043 05/25/22 1323 05/26/22 0129 05/27/22 1052 05/28/22 0435  K 3.2*  --  3.2* 3.7 3.8  MG  --  1.6* 1.8 2.0 2.2  Chronically smoker Reports smoking 1 PPD last 45 years.  Counseled to quit  Nicotine patch offered    Hyperlipidemia Continue atorvastatin.  Wounds:  -    Discharge Exam:   Vitals:   05/27/22 2227 05/27/22 2229  05/28/22 0447 05/28/22 0613  BP: (!) 188/82 (!) 175/93 (!) 194/91 (!) 172/80  Pulse: 61 62 66 (!) 56  Resp:   20   Temp:   97.9 F (36.6 C)   TempSrc:   Oral   SpO2:   99%   Weight:      Height:        Body mass index is 38.26 kg/m.  General exam: Pleasant, [name].  Not in physical distress Skin: No rashes, lesions or ulcers. HEENT: Atraumatic, normocephalic, no obvious bleeding Lungs: Clear to auscultate bilaterally CVS: Skipped beats.  Regular rhythm, no murmur GI/Abd soft, nontender, nondistended, bowel sound present CNS: Alert, awake, oriented x 3 Psychiatry: Mood appropriate Extremities: No pedal edema, no calf tenderness  Follow ups:    Follow-up Information     Azalee Course, [place] Follow up.   Specialties: Cardiology, Radiology Why: [facility] - [address] location - cardiology follow-up has been arranged on Thursday Jun 04, 2022 at 2:45 PM (Arrive by 2:30 PM). Our office will also call you to arrange follow-up with our electrical specialty team. Contact information: [address] [place] [address] [phone]                 Discharge Instructions:   Discharge Instructions     Call MD for:  difficulty breathing, headache or visual disturbances   Complete by: As directed    Call MD for:  extreme fatigue   Complete by: As directed    Call MD for:  hives   Complete by: As directed    Call MD for:  persistant dizziness or light-headedness   Complete by: As directed    Call MD for:  persistant nausea and vomiting   Complete by: As directed    Call MD for:  severe uncontrolled pain   Complete by: As directed    Call MD for:  temperature >100.4   Complete by: As directed    Diet - low sodium heart healthy   Complete by: As directed    Discharge instructions   Complete by: As directed    Recommendations at discharge:   You been started on Xarelto which is a blood thinner.  Please watch out for any  evidence of bleeding or excessive bruisability.  He will need Xarelto for at least 6 months.  Follow-up with PCP for determination on long-term need.  Follow-up with cardiology as an outpatient for blood pressure management and further cardiac workup  General discharge instructions: Follow with Primary MD Patient, No Pcp Per in 7 days  Please request your PCP  to go over your hospital tests, procedures, radiology results at the follow up. Please get your medicines reviewed and adjusted.  Your PCP may decide to repeat certain labs or tests as needed. Do not drive, operate heavy machinery, perform activities at heights, swimming or participation in water activities or provide baby sitting services if your were admitted for syncope or siezures until you have seen by Primary MD or a Neurologist and advised to do so again. [place] Controlled Substance Reporting System database was reviewed. Do not drive, operate heavy machinery, perform activities at heights, swim, participate in water activities or provide baby-sitting services while on medications  for pain, sleep and mood until your outpatient physician has reevaluated you and advised to do so again.  You are strongly recommended to comply with the dose, frequency and duration of prescribed medications. Activity: As tolerated with Full fall precautions use walker/cane & assistance as needed Avoid using any recreational substances like cigarette, tobacco, alcohol, or non-prescribed drug. If you experience worsening of your admission symptoms, develop shortness of breath, life threatening emergency, suicidal or homicidal thoughts you must seek medical attention immediately by calling 911 or calling your MD immediately  if symptoms less severe. You must read complete instructions/literature along with all the possible adverse reactions/side effects for all the medicines you take and that have been prescribed to you. Take any new medicine only after you  have completely understood and accepted all the possible adverse reactions/side effects.  Wear Seat belts while driving. You were cared for by a hospitalist during your hospital stay. If you have any questions about your discharge medications or the care you received while you were in the hospital after you are discharged, you can call the unit and ask to speak with the hospitalist or the covering physician. Once you are discharged, your primary care physician will handle any further medical issues. Please note that NO REFILLS for any discharge medications will be authorized once you are discharged, as it is imperative that you return to your primary care physician (or establish a relationship with a primary care physician if you do not have one).   Increase activity slowly   Complete by: As directed        Discharge Medications:   Allergies as of 05/28/2022   No Known Allergies      Medication List     TAKE these medications    atorvastatin 40 MG tablet Commonly known as: LIPITOR Take 1 tablet (40 mg total) by mouth daily.   buprenorphine 15 MCG/HR Commonly known as: BUTRANS Place 1 patch onto the skin once a week.   gabapentin 400 MG capsule Commonly known as: Neurontin Take 1 capsule (400 mg total) by mouth 3 (three) times daily. What changed:  when to take this reasons to take this   hydrALAZINE 25 MG tablet Commonly known as: APRESOLINE Take 1 tablet (25 mg total) by mouth 2 (two) times daily. What changed: when to take this   metFORMIN 500 MG 24 hr tablet Commonly known as: GLUCOPHAGE-XR Take 500 mg by mouth daily with breakfast.   oxyCODONE-acetaminophen 10-325 MG tablet Commonly known as: PERCOCET Take 1 tablet by mouth every 6 (six) hours as needed for pain.   Rivaroxaban Stater Pack (15 mg and 20 mg) Commonly known as: XARELTO STARTER PACK Follow package directions: Take one 15mg  tablet by mouth twice a day. On day 22, switch to one 20mg  tablet once a day.  Take with food.   spironolactone 25 MG tablet Commonly known as: ALDACTONE Take 1 tablet (25 mg total) by mouth daily.   testosterone cypionate 200 MG/ML injection Commonly known as: DEPOTESTOSTERONE CYPIONATE Inject 200 mg into the muscle every 14 (fourteen) days.   valsartan 320 MG tablet Commonly known as: DIOVAN Take 1 tablet (320 mg total) by mouth daily.         The results of significant diagnostics from this hospitalization (including imaging, microbiology, ancillary and laboratory) are listed below for reference.    Procedures and Diagnostic Studies:   VAS [place] LOWER EXTREMITY VENOUS (DVT)  Result Date: 05/26/2022  Lower Venous DVT Study Patient Name:  Christopher Grand  Date of Exam:   05/26/2022 Medical Rec #: [id number]       Accession #:    [id number] Date of Birth: Jan 08, 1971       Patient Gender: M Patient Age:   85 years Exam Location:  [facility] Procedure:      VAS [place] LOWER EXTREMITY VENOUS (DVT) Referring Phys: Lorin Glass --------------------------------------------------------------------------------  Indications: Pulmonary embolism.  Performing Technologist: Ernestene Mention RVT, RDMS  Examination Guidelines: A complete evaluation includes B-mode imaging, spectral Doppler, color Doppler, and power Doppler as needed of all accessible portions of each vessel. Bilateral testing is considered an integral part of a complete examination. Limited examinations for reoccurring indications may be performed as noted. The reflux portion of the exam is performed with the patient in reverse Trendelenburg.  +---------+---------------+---------+-----------+----------+--------------+ RIGHT    CompressibilityPhasicitySpontaneityPropertiesThrombus Aging +---------+---------------+---------+-----------+----------+--------------+ CFV      Full           Yes      Yes                                 +---------+---------------+---------+-----------+----------+--------------+ SFJ       Full                                                        +---------+---------------+---------+-----------+----------+--------------+ FV Prox  Full           Yes      Yes                                 +---------+---------------+---------+-----------+----------+--------------+ FV Mid   Full           Yes      Yes                                 +---------+---------------+---------+-----------+----------+--------------+ FV DistalFull           Yes      Yes                                 +---------+---------------+---------+-----------+----------+--------------+ PFV      Full                                                        +---------+---------------+---------+-----------+----------+--------------+ POP      Full           Yes      Yes                                 +---------+---------------+---------+-----------+----------+--------------+ PTV      Full                                                        +---------+---------------+---------+-----------+----------+--------------+  PERO     Full                                                        +---------+---------------+---------+-----------+----------+--------------+   +---------+---------------+---------+-----------+----------+--------------+ LEFT     CompressibilityPhasicitySpontaneityPropertiesThrombus Aging +---------+---------------+---------+-----------+----------+--------------+ CFV      Full           Yes      Yes                                 +---------+---------------+---------+-----------+----------+--------------+ SFJ      Full                                                        +---------+---------------+---------+-----------+----------+--------------+ FV Prox  Full           Yes      Yes                                 +---------+---------------+---------+-----------+----------+--------------+ FV Mid   Full           Yes      Yes                                  +---------+---------------+---------+-----------+----------+--------------+ FV DistalFull           Yes      Yes                                 +---------+---------------+---------+-----------+----------+--------------+ PFV      Full                                                        +---------+---------------+---------+-----------+----------+--------------+ POP      Full           Yes      Yes                                 +---------+---------------+---------+-----------+----------+--------------+ PTV      None           No       No                   Acute          +---------+---------------+---------+-----------+----------+--------------+ PERO     None           No       No                   Acute          +---------+---------------+---------+-----------+----------+--------------+     Summary: BILATERAL: -No evidence of popliteal cyst, bilaterally. RIGHT: - There is no evidence  of deep vein thrombosis in the lower extremity.  LEFT: - Findings consistent with acute deep vein thrombosis involving the left posterior tibial veins, and left peroneal veins.  *See table(s) above for measurements and observations. Electronically signed by Coral Else MD on 05/26/2022 at 11:22:05 PM.    Final    ECHOCARDIOGRAM COMPLETE  Result Date: 05/26/2022    ECHOCARDIOGRAM REPORT   Patient Name:   DOC MANDALA Date of Exam: 05/26/2022 Medical Rec #:  [id number]      Height:       73.0 in Accession #:    [id number]     Weight:       290.0 lb Date of Birth:  [birth year]      BSA:          2.520 m Patient Age:    51 years       BP:           185/76 mmHg Patient Gender: M              HR:           73 bpm. Exam Location:  Inpatient Procedure: 2D Echo, Color Doppler, Cardiac Doppler and Intracardiac            Opacification Agent Indications:    Pulmonary embolus                 Heart block 2nd Degree  History:        Patient has no prior history of Echocardiogram  examinations.                 Signs/Symptoms:Dyspnea and Chest Pain; Risk                 Factors:Hypertension, Dyslipidemia and Former Smoker.  Sonographer:    Milda Smart Referring Phys: [id number] VISHAL R PATEL  Sonographer Comments: Technically difficult study due to poor echo windows. Image acquisition challenging due to patient body habitus and Image acquisition challenging due to respiratory motion. IMPRESSIONS  1. Left ventricular ejection fraction, by estimation, is 65 to 70%. The left ventricle has normal function. The left ventricle has no regional wall motion abnormalities. The left ventricular internal cavity size was mildly dilated. 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 Left ventricular diastolic parameters are indeterminate.  2. Right ventricular systolic function is normal. The right ventricular size is normal. Tricuspid regurgitation signal is inadequate for assessing PA pressure.  3. Left atrial size was mildly dilated.  4. A small pericardial effusion is present. The pericardial effusion is posterior to the left ventricle. There is no evidence of cardiac tamponade.  5. The mitral valve is normal in structure. No evidence of mitral valve regurgitation. No evidence of mitral stenosis.  6. The aortic valve is tricuspid. There is mild calcification of the aortic valve. Aortic valve regurgitation is not visualized.  7. The inferior vena cava is normal in size with greater than 50% respiratory variability, suggesting right atrial pressure of 3 mmHg. Comparison(s): No prior Echocardiogram. Conclusion(s)/Recommendation(s): There is 2:1 heart block seen throughout imaging. CMR may be considered if hypertrophy is not due to long standing hypertension. FINDINGS  Left Ventricle: Septal thickness 2.44 on PLAX view, 2.0 cm on PSAX. Left ventricular ejection fraction, by estimation, is 65 to 70%. The left ventricle has normal function. The left ventricle has no regional  wall motion abnormalities. Definity contrast agent was given IV to delineate the left ventricular endocardial borders. The left ventricular  internal cavity size was mildly dilated. 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 Left ventricular diastolic parameters are indeterminate. Right Ventricle: The right ventricular size is normal. No increase in right ventricular wall thickness. Right ventricular systolic function is normal. Tricuspid regurgitation signal is inadequate for assessing PA pressure. Left Atrium: Left atrial size was mildly dilated. Right Atrium: Right atrial size was normal in size. Pericardium: A small pericardial effusion is present. The pericardial effusion is posterior to the left ventricle. There is no evidence of cardiac tamponade. Mitral Valve: The mitral valve is normal in structure. No evidence of mitral valve regurgitation. No evidence of mitral valve stenosis. Tricuspid Valve: The tricuspid valve is normal in structure. Tricuspid valve regurgitation is not demonstrated. No evidence of tricuspid stenosis. Aortic Valve: The aortic valve is tricuspid. There is mild calcification of the aortic valve. Aortic valve regurgitation is not visualized. Pulmonic Valve: The pulmonic valve was not well visualized. Pulmonic valve regurgitation is trivial. No evidence of pulmonic stenosis. Aorta: The aortic root and ascending aorta are structurally normal, with no evidence of dilitation. Venous: The inferior vena cava is normal in size with greater than 50% respiratory variability, suggesting right atrial pressure of 3 mmHg. IAS/Shunts: No atrial level shunt detected by color flow Doppler.  LEFT VENTRICLE PLAX 2D LVIDd:         6.10 cm   Diastology LVIDs:         3.80 cm   LV e' medial:    10.20 cm/s LV PW:         1.10 cm   LV E/e' medial:  12.5 LV IVS:        1.00 cm   LV e' lateral:   14.90 cm/s LVOT diam:     2.60 cm   LV E/e' lateral: 8.5 LV SV:         143 LV SV  Index:   57 LVOT Area:     5.31 cm  RIGHT VENTRICLE             IVC RV S prime:     17.10 cm/s  IVC diam: 2.40 cm TAPSE (M-mode): 2.2 cm LEFT ATRIUM           Index        RIGHT ATRIUM           Index LA diam:      4.70 cm 1.87 cm/m   RA Area:     20.80 cm LA Vol (A4C): 83.9 ml 33.30 ml/m  RA Volume:   61.90 ml  24.57 ml/m  AORTIC VALVE LVOT Vmax:   122.00 cm/s LVOT Vmean:  98.600 cm/s LVOT VTI:    0.269 m  AORTA Ao Root diam: 3.40 cm Ao Asc diam:  3.50 cm MITRAL VALVE MV Area (PHT): 3.70 cm     SHUNTS MV Decel Time: 205 msec     Systemic VTI:  0.27 m MV E velocity: 127.00 cm/s  Systemic Diam: 2.60 cm Riley Lam MD Electronically signed by Riley Lam MD Signature Date/Time: 05/26/2022/9:58:35 AM    Final    CT Angio Chest/Abd/Pel for Dissection W and/or Wo Contrast  Result Date: 05/25/2022 CLINICAL DATA:  Acute aortic syndrome suspected. EXAM: CT ANGIOGRAPHY CHEST, ABDOMEN AND PELVIS TECHNIQUE: Non-contrast CT of the chest was initially obtained. Multidetector CT imaging through the chest, abdomen and pelvis was performed using the standard protocol during bolus administration of intravenous contrast. Multiplanar reconstructed images and MIPs were obtained and  reviewed to evaluate the vascular anatomy. RADIATION DOSE REDUCTION: This exam was performed according to the departmental dose-optimization program which includes automated exposure control, adjustment of the mA and/or kV according to patient size and/or use of iterative reconstruction technique. CONTRAST:  OMNIPAQUE IOHEXOL 350 MG/ML SOLN COMPARISON:  None Available. FINDINGS: CTA CHEST FINDINGS Cardiovascular: There is adequate opacification of the thoracic aorta. There is no evidence for aneurysm or dissection. There is mild calcified atherosclerotic disease throughout the aorta. Heart is borderline enlarged. There is no pericardial effusion. There is also opacification of the pulmonary arteries. There are segmental and  subsegmental pulmonary emboli within the left lower lobe and right upper lobe. Mediastinum/Nodes: There is an enlarged precarinal lymph node measuring 17 mm short axis. No other enlarged lymph nodes are identified. Visualized thyroid gland and esophagus are within normal limits. Lungs/Pleura: Lungs are clear. No pleural effusion or pneumothorax. Musculoskeletal: No chest wall abnormality. No acute or significant osseous findings. Review of the MIP images confirms the above findings. CTA ABDOMEN AND PELVIS FINDINGS VASCULAR Aorta: Normal caliber aorta without aneurysm, dissection, vasculitis or significant stenosis. There is moderate calcified atherosclerotic disease throughout the aorta. Celiac: Patent without evidence of aneurysm, dissection, vasculitis or significant stenosis. SMA: Patent without evidence of aneurysm, dissection, vasculitis or significant stenosis. Renals: Both renal arteries are patent without evidence of aneurysm, dissection, vasculitis, fibromuscular dysplasia or significant stenosis. IMA: Patent without evidence of aneurysm, dissection, vasculitis or significant stenosis. Inflow: Patent without evidence of aneurysm, dissection, vasculitis or significant stenosis. Veins: No obvious venous abnormality within the limitations of this arterial phase study. Review of the MIP images confirms the above findings. NON-VASCULAR Hepatobiliary: No focal liver abnormality is seen. No gallstones, gallbladder wall thickening, or biliary dilatation. Pancreas: Unremarkable. No pancreatic ductal dilatation or surrounding inflammatory changes. Spleen: Normal in size without focal abnormality. Adrenals/Urinary Tract: Adrenal glands are unremarkable. Kidneys are normal, without renal calculi, focal lesion, or hydronephrosis. Bladder is unremarkable. Stomach/Bowel: Stomach is within normal limits. Appendix appears normal. No evidence of bowel wall thickening, distention, or inflammatory changes. Lymphatic: No  enlarged lymph nodes are seen. Reproductive: Mildly enlarged prostate gland. Other: Small fat containing right inguinal hernia.  No ascites. Musculoskeletal: No acute or significant osseous findings. Review of the MIP images confirms the above findings. IMPRESSION: 1. No evidence for aortic dissection or aneurysm. 2. Acute segmental and subsegmental pulmonary emboli in the left lower lobe and right upper lobe. No evidence for right heart strain. 3. Enlarged precarinal lymph node measuring 17 mm, indeterminate. 4. No acute process in the abdomen or pelvis. Aortic Atherosclerosis (ICD10-I70.0). Electronically Signed   By: Darliss Cheney M.D.   On: 05/25/2022 17:52   DG Chest 1 View  Result Date: 05/25/2022 CLINICAL DATA:  Chest pain.  Shortness of breath. EXAM: CHEST  1 VIEW COMPARISON:  March 25, 2018. FINDINGS: The heart size and mediastinal contours are within normal limits. Both lungs are clear. The visualized skeletal structures are unremarkable. IMPRESSION: No active disease. Electronically Signed   By: Lupita Raider M.D.   On: 05/25/2022 11:12     Labs:   Basic Metabolic Panel: Recent Labs  Lab 05/25/22 1043 05/25/22 1323 05/26/22 0129 05/27/22 1052 05/28/22 0435  NA 143  --  140 139 139  K 3.2*  --  3.2* 3.7 3.8  CL 102  --  104 105 107  CO2 28  --  26 26 26   GLUCOSE 143*  --  143* 121* 121*  BUN 11  --  15 19 20   CREATININE 0.90  --  0.88 0.87 0.85  CALCIUM 9.2  --  8.8* 8.7* 8.6*  MG  --  1.6* 1.8 2.0 2.2   GFR Estimated Creatinine Clearance: 142.9 mL/min (by C-G formula based on SCr of 0.85 mg/dL). Liver Function Tests: No results for input(s): "AST", "ALT", "ALKPHOS", "BILITOT", "PROT", "ALBUMIN" in the last 168 hours. No results for input(s): "LIPASE", "AMYLASE" in the last 168 hours. No results for input(s): "AMMONIA" in the last 168 hours. Coagulation profile Recent Labs  Lab 05/25/22 1849  INR 1.1    CBC: Recent Labs  Lab 05/25/22 1043 05/26/22 0129  05/27/22 0438  WBC 7.2 8.1 9.2  HGB 16.3 15.6 15.1  HCT 51.3 47.9 48.4  MCV 95.5 96.4 98.4  PLT 242 226 206   Cardiac Enzymes: No results for input(s): "CKTOTAL", "CKMB", "CKMBINDEX", "TROPONINI" in the last 168 hours. BNP: Invalid input(s): "POCBNP" CBG: No results for input(s): "GLUCAP" in the last 168 hours. D-Dimer No results for input(s): "DDIMER" in the last 72 hours. Hgb A1c No results for input(s): "HGBA1C" in the last 72 hours. Lipid Profile No results for input(s): "CHOL", "HDL", "LDLCALC", "TRIG", "CHOLHDL", "LDLDIRECT" in the last 72 hours. Thyroid function studies Recent Labs    05/26/22 0129  TSH 1.065   Anemia work up No results for input(s): "VITAMINB12", "FOLATE", "FERRITIN", "TIBC", "IRON", "RETICCTPCT" in the last 72 hours. Microbiology Recent Results (from the past 240 hour(s))  Resp panel by RT-PCR (RSV, Flu A&B, Covid) Anterior Nasal Swab     Status: None   Collection Time: 05/25/22  2:05 PM   Specimen: Anterior Nasal Swab  Result Value Ref Range Status   SARS Coronavirus 2 by RT PCR NEGATIVE NEGATIVE Final    Comment: (NOTE) SARS-CoV-2 target nucleic acids are NOT DETECTED.  The SARS-CoV-2 RNA is generally detectable in upper respiratory specimens during the acute phase of infection. The lowest concentration of SARS-CoV-2 viral copies this assay can detect is 138 copies/mL. A negative result does not preclude SARS-Cov-2 infection and should not be used as the sole basis for treatment or other patient management decisions. A negative result may occur with  improper specimen collection/handling, submission of specimen other than nasopharyngeal swab, presence of viral mutation(s) within the areas targeted by this assay, and inadequate number of viral copies(<138 copies/mL). A negative result must be combined with clinical observations, patient history, and epidemiological information. The expected result is Negative.  Fact Sheet for Patients:   14/11/23  Fact Sheet for Healthcare Providers:  BloggerCourse.com  This test is no t yet approved or cleared by the [employer] FDA and  has been authorized for detection and/or diagnosis of SARS-CoV-2 by FDA under an Emergency Use Authorization (EUA). This EUA will remain  in effect (meaning this test can be used) for the duration of the COVID-19 declaration under Section 564(b)(1) of the Act, 21 U.S.C.section 360bbb-3(b)(1), unless the authorization is terminated  or revoked sooner.       Influenza A by PCR NEGATIVE NEGATIVE Final   Influenza B by PCR NEGATIVE NEGATIVE Final    Comment: (NOTE) The Xpert Xpress SARS-CoV-2/FLU/RSV plus assay is intended as an aid in the diagnosis of influenza from Nasopharyngeal swab specimens and should not be used as a sole basis for treatment. Nasal washings and aspirates are unacceptable for Xpert Xpress SARS-CoV-2/FLU/RSV testing.  Fact Sheet for Patients: [place]  Fact Sheet for Healthcare Providers: BloggerCourse.com  This test is not yet approved or cleared by  the [employer] and has been authorized for detection and/or diagnosis of SARS-CoV-2 by FDA under an Emergency Use Authorization (EUA). This EUA will remain in effect (meaning this test can be used) for the duration of the COVID-19 declaration under Section 564(b)(1) of the Act, 21 U.S.C. section 360bbb-3(b)(1), unless the authorization is terminated or revoked.     Resp Syncytial Virus by PCR NEGATIVE NEGATIVE Final    Comment: (NOTE) Fact Sheet for Patients: BloggerCourse.com  Fact Sheet for Healthcare Providers: SeriousBroker.it  This test is not yet approved or cleared by the [place] FDA and has been authorized for detection and/or diagnosis of SARS-CoV-2 by FDA under an Emergency Use  Authorization (EUA). This EUA will remain in effect (meaning this test can be used) for the duration of the COVID-19 declaration under Section 564(b)(1) of the Act, 21 U.S.C. section 360bbb-3(b)(1), unless the authorization is terminated or revoked.  Performed at [facility], [address], [place], [place] [address]     Time coordinating discharge: 35 minutes  Signed: Melina Schools Joe Tanney  [facility] 05/28/2022, 11:14 AM

## 2022-05-28 NOTE — Progress Notes (Signed)
Mobility Specialist - Progress Note   05/28/22 1042  Mobility  Activity Ambulated independently in hallway  Level of Assistance Independent  Assistive Device None  Distance Ambulated (ft) 240 ft  Activity Response Tolerated well  Mobility Referral Yes  $Mobility charge 1 Mobility   Pt received in bed and agreeable to mobility. No complaints during mobility. Pt to bed after session with all needs met.     [facility]

## 2022-05-28 NOTE — Progress Notes (Signed)
Completed EKG per order but it did not transfer into the system. The result is placed in the pt's chart.

## 2022-05-28 NOTE — Progress Notes (Signed)
  Transition of Care [facility]) Screening Note   Patient Details  Name: Christopher Green Date of Birth: August 22, 1970   Transition of Care [facility]) CM/SW Contact:    Lanier Clam, RN Phone Number: 05/28/2022, 12:13 PM    Transition of Care Department [facility]) has reviewed patient and no TOC needs have been identified at this time. We will continue to monitor patient advancement through interdisciplinary progression rounds. If new patient transition needs arise, please place a TOC consult.

## 2022-06-02 DIAGNOSIS — M542 Cervicalgia: Secondary | ICD-10-CM | POA: Diagnosis not present

## 2022-06-02 DIAGNOSIS — G8929 Other chronic pain: Secondary | ICD-10-CM | POA: Diagnosis not present

## 2022-06-02 DIAGNOSIS — Z79899 Other long term (current) drug therapy: Secondary | ICD-10-CM | POA: Diagnosis not present

## 2022-06-02 DIAGNOSIS — E559 Vitamin D deficiency, unspecified: Secondary | ICD-10-CM | POA: Diagnosis not present

## 2022-06-02 DIAGNOSIS — E1165 Type 2 diabetes mellitus with hyperglycemia: Secondary | ICD-10-CM | POA: Diagnosis not present

## 2022-06-02 DIAGNOSIS — Z09 Encounter for follow-up examination after completed treatment for conditions other than malignant neoplasm: Secondary | ICD-10-CM | POA: Diagnosis not present

## 2022-06-02 DIAGNOSIS — Z6837 Body mass index (BMI) 37.0-37.9, adult: Secondary | ICD-10-CM | POA: Diagnosis not present

## 2022-06-03 LAB — ALDOSTERONE + RENIN ACTIVITY W/ RATIO
ALDO / PRA Ratio: <2.1 (ref 0.0–30.0)
Aldosterone: <1 ng/dL (ref 0.0–30.0)
PRA LC/MS/MS: 0.477 ng/mL/h (ref 0.167–5.380)

## 2022-06-03 NOTE — Progress Notes (Signed)
Cardiology Office Note:    Date:  06/05/2022   ID:  Christopher Green, DOB 07/26/1970, MRN [id number]  PCP:  Patient, No Pcp Per   [facility] Providers Cardiologist:  Thomasene Ripple, DO     Referring MD: No ref. provider found   Chief Complaint: Hospital follow-up for hypertension  History of Present Illness:    Christopher Green is a very pleasant 51 y.o. male with a hx of chronic back pain followed by pain clinic, HTN, HLD, tobacco abuse, daily alcohol use, and abnormal nuclear stress test with normal cores on cath October 2019.  History of admission to [facility] September 2019 with elevated BP, troponin was elevated at the time. Subsequently seen by Dr. Vernona Rieger in [facility] and underwent [facility] 03/08/18 which was concerning for inferior ischemia.  Subsequent cardiac catheterization performed on 03/25/2018 showed minimal coronary artery disease (20% or less). The stress test was felt to be false positive and likely represent diaphragmatic attenuation artifact, elevated troponin felt 2/2 uncontrolled BP. He was prescribed amlodipine-valsartan (Exforge), Coreg, hydralazine, HCTZ, and spironolactone. He was to follow-up with cardiology service as needed. He was taken off Exforge and carvedilol over the years. He lost his insurance and ran out of medications 2 weeks prior to admission.   Cardiology consult during admission on 05/26/2022 for evaluation of second-degree heart block. Presented to [facility] on 12/11 with dyspnea, thought he was developing a URI. On arrival, systolic BP was over 200 and his heart rate was in the 40s.  Serial troponin 61 ? 82. CTA of the chest abdomen and pelvis was negative for acute aortic dissection or aneurysm, however did reveal acute segmental and subsegmental PE in the left lower lobe and right upper lobe, no evidence for right heart strain.  Ultrasound positive for DVT LLE. 2D echo revealed EF 65 to 70%, severe asymmetric LVH of the septal segment, normal RV,  small pericardial effusion without significant tamponade with LVH felt likely related to HTN, not recommended to undergo cMRI at this time. Placed on irbesartan with transition to valsartan 320 mg at discharge , hydralazine 25 mg BID, spironolactone. Hypokalemia and hypomagnesemia improved during admission. Suspicion for OSA/sleep disordered breathing, plan for OP sleep study. Concern for HB - predominantly NSR and second-degree AVB type I while awake, higher grade type II/possible brief CHB during sleeping hours also appears to have an ectopic atrial rhythm from different focus with higher atrial rate and intermittent 2-1 block though HR 70s during this time, some wandering pacemaker as well.  No high risk presyncope, syncope reported.  Discussed with EP, may be related to untreated OSA, vagal tone.  Given asymptomatic nature they do not feel acute intervention or PPM needed at this time. They do not recommend monitor given frequency of events seen on telemetry during admission. Plan to follow-up with EP as an outpatient.  Today, he is here for hospital follow-up. Has felt improvement each day since discharge. Having less DOE, can walk farther distance without significant shortness of breath. Felt sensation like heart was quivering on 2 occasions, each of those were stressful situations. 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 He denies chest pain, edema, orthopnea, PND. Back pain makes sleeping difficult so he sometimes sleeps in a recliner. Has smart watch for monitoring HR and notes  variations not consistent with exertion, sometimes HR is low when walking and sometimes high at rest. Monitors home BP but does not have any readings to  review.  BP was 140/70 at pain clinic on 12/18. No dizziness, lightheadedness, presyncope, syncope. No bleeding concerns.   Past Medical History:  Diagnosis Date   Hypertension     Past Surgical History:  Procedure Laterality Date   CARDIAC  CATHETERIZATION  2019   clean cors    Current Medications: Current Meds  Medication Sig   atorvastatin (LIPITOR) 40 MG tablet Take 1 tablet (40 mg total) by mouth daily.   buprenorphine (BUTRANS) 15 MCG/HR Place 1 patch onto the skin once a week.   buPROPion (WELLBUTRIN SR) 150 MG 12 hr tablet Take 1 tablet (150 mg total) by mouth 2 (two) times daily. Start 150 mg daily then increase to twice daily after 3 days for smoking cessation. Can continue up to 12 weeks.   metFORMIN (GLUCOPHAGE-XR) 500 MG 24 hr tablet Take 500 mg by mouth daily with breakfast.   oxyCODONE-acetaminophen (PERCOCET) 10-325 MG tablet Take 1 tablet by mouth every 6 (six) hours as needed for pain.   RIVAROXABAN (XARELTO) VTE STARTER PACK (15 & 20 MG) Follow package directions: Take one 15mg  tablet by mouth twice a day. On day 22, switch to one 20mg  tablet once a day. Take with food.   spironolactone (ALDACTONE) 25 MG tablet Take 1 tablet (25 mg total) by mouth daily.   testosterone cypionate (DEPOTESTOSTERONE CYPIONATE) 200 MG/ML injection Inject 200 mg into the muscle every 14 (fourteen) days.   valsartan (DIOVAN) 320 MG tablet Take 1 tablet (320 mg total) by mouth daily.   [DISCONTINUED] hydrALAZINE (APRESOLINE) 25 MG tablet Take 1 tablet (25 mg total) by mouth 2 (two) times daily.     Allergies:   Patient has no known allergies.   Social History   Socioeconomic History   Marital status: Divorced    Spouse name: Not on file   Number of children: Not on file   Years of education: Not on file   Highest education level: Not on file  Occupational History   Not on file  Tobacco Use   Smoking status: Every Day    Types: Cigarettes   Smokeless tobacco: Never  Substance and Sexual Activity   Alcohol use: Yes    Comment: daily   Drug use: Never   Sexual activity: Not on file  Other Topics Concern   Not on file  Social History Narrative   Not on file   Social Determinants of Health   Financial Resource  Strain: Not on file  Food Insecurity: No Food Insecurity (05/25/2022)   Hunger Vital Sign    Worried About Running Out of Food in the Last Year: Never true    Ran Out of Food in the Last Year: Never true  Transportation Needs: No Transportation Needs (05/25/2022)   PRAPARE - 14/04/2022 (Medical): No    Lack of Transportation (Non-Medical): No  Physical Activity: Not on file  Stress: Not on file  Social Connections: Not on file     Family History: The patient's family history includes Heart attack in his father.  ROS:   Please see the history of present illness.   + chronic DOE All other systems reviewed and are negative.  Labs/Other Studies Reviewed:    The following studies were reviewed today:  CTA Chest/Abd/Pelvis 05/25/22 1. No evidence for aortic dissection or aneurysm. 2. Acute segmental and subsegmental pulmonary emboli in the left lower lobe and right upper lobe. No evidence for right heart strain. 3. Enlarged precarinal lymph node  measuring 17 mm, indeterminate. 4. No acute process in the abdomen or pelvis.   Aortic Atherosclerosis (ICD10-I70.0).  VAS US DVT 05/26/22 Summary:  BILATERAL:  -No evidence of popliteal cyst, bilaterally.  RIGHT:  - There is no evidence of deep vein thrombosis in the lower extremity.    LEFT:  - Findings consistent with acute deep vein thrombosis involving the left  posterior tibial veins, and left peroneal veins.   Echo 05/26/22 1. Left ventricular ejection fraction, by estimation, is 65 to 70%. The  left ventricle has normal function. The left ventricle has no regional  wall motion abnormalities. The left ventricular internal cavity size was  mildly dilated. There is severe  asymmetric left ventricular hypertrophy of the septal segment. Left  ventricular diastolic parameters are indeterminate.   2. Right ventricular systolic function is normal. The right ventricular  size is normal. Tricuspid  regurgitation signal is inadequate for assessing  PA pressure.   3. Left atrial size was mildly dilated.   4. A small pericardial effusion is present. The pericardial effusion is  posterior to the left ventricle. There is no evidence of cardiac  tamponade.   5. The mitral valve is normal in structure. No evidence of mitral valve  regurgitation. No evidence of mitral stenosis.   6. The aortic valve is tricuspid. There is mild calcification of the  aortic valve. Aortic valve regurgitation is not visualized.   7. The inferior vena cava is normal in size with greater than 50%  respiratory variability, suggesting right atrial pressure of 3 mmHg.   Comparison(s): No prior Echocardiogram.  Cath 03/25/2018 Procedure  Procedure Type   Diagnostic procedure: CARDIAC CATH  Complications: No Complications.  Conclusions  Diagnostic Procedure Summary  INDICATION: Abnormal troponin, abnormal nuclear stress.  1. Mild, nonobstructive CAD.  2. Normal LV systolic function, EF 65%  3. Normal LV regional wall motion.  Diagnostic Procedure Recommendations  The abnormal troponin was likely secondary to the severely elevated blood  pressure the patient had at the time.  The nuclear stress test result is a false positive, likely due to  diaphragmatic attenuation.  A healthy lifestyle was recommended.  The patient will continue to follow up with his PCP regarding primary  prevention measures.   Signatures   Electronically signed by Tollie Pizza, DO, FACC(Diagnostic   Physician) on 03/25/2018 09:18   Angiographic findings   Cardiac Arteries and Lesion Findings  LMCA: Normal appearance with 0% stenosis.  LAD: Abnormal.  Proximal - 20%  Mid - 20% diffuse  Diagonal - 20%  LCx: Abnormal.  Proximal - 20%  RCA: Abnormal.  Proximal - 20%     Recent Labs: 05/25/2022: B Natriuretic Peptide 79.2 05/26/2022: TSH 1.065 05/27/2022: Hemoglobin 15.1; Platelets 206 05/28/2022: BUN 20; Creatinine, Ser 0.85;  Magnesium 2.2; Potassium 3.8; Sodium 139  Recent Lipid Panel No results found for: "CHOL", "TRIG", "HDL", "CHOLHDL", "VLDL", "LDLCALC", "LDLDIRECT"   Risk Assessment/Calculations:       Physical Exam:    VS:  BP (!) 156/64   Pulse (!) 49   Ht 6\' 1"  (1.854 m)   Wt 289 lb 6.4 oz (131.3 kg)   SpO2 97%   BMI 38.18 kg/m     Wt Readings from Last 3 Encounters:  06/05/22 289 lb 6.4 oz (131.3 kg)  05/25/22 290 lb (131.5 kg)     GEN: Obese, well developed in no acute distress HEENT: Normal NECK: No JVD; No carotid bruits CARDIAC: RRR, no murmurs, rubs, gallops RESPIRATORY:  Clear to auscultation without rales, wheezing or rhonchi  ABDOMEN: Soft, non-tender, non-distended MUSCULOSKELETAL:  No edema; No deformity. 2+ pedal pulses, equal bilaterally SKIN: Warm and dry NEUROLOGIC:  Alert and oriented x 3 PSYCHIATRIC:  Normal affect   EKG:  EKG is not ordered today.    HYPERTENSION CONTROL Vitals:   06/05/22 0837 06/05/22 0913  BP: (!) 188/76 (!) 156/64    The patient's blood pressure is elevated above target today.  In order to address the patient's elevated BP: A current anti-hypertensive medication was adjusted today.     Diagnoses:    1. Essential hypertension   2. Other hyperlipidemia   3. Tobacco use   4. Second degree AV block   5. Acute pulmonary embolism without acute cor pulmonale, unspecified pulmonary embolism type (HCC)    Assessment and Plan:     Hypertension: Hypertensive urgency during admission 05/2022. No renal artery stenosis on CT 12/11. Aldosterone and renin were negative for hyperaldosteronism 12/13. BP initially very elevated, improved on recheck but remained above goal. Per review of hospital records, avoiding beta-blocker and amlodipine 2/2 conduction disease. Avoiding diuretic 2/2 hypokalemia. We will increase his hydralazine to 25 mg 3 times daily. He says taking mid day dose will not be a problem. Advised him to notify us if BP consistently >  140 or > 88 at home. Consider increasing spironolactone if kidney function remains stable. Checking BMP today. Encouraged low sodium diet < 2000 mg daily.   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 Has consult scheduled for 06/1822 with EP, Dr. Ladona Ridgel. No dizziness, presyncope, syncope.Continue to avoid AV nodal blocking agents.   Tobacco abuse: Has reduced number of cigarettes. Willing to try Wellbutrin. Complete cessation advised.   PE: Dyspnea continues to improve. No bleeding problems on Xarelto. Management per PCP.   Hyperlipidemia: No cholesterol results to review.  He was started on atorvastatin during hospitalization. Will need fasting lipid and liver panel in 2 months.      Disposition: Keep your consult appointment on 1/18 with Dr. Nada Maclachlan months with Dr. Servando Salina  Medication Adjustments/Labs and Tests Ordered: Current medicines are reviewed at length with the patient today.  Concerns regarding medicines are outlined above.  Orders Placed This Encounter  Procedures   Basic Metabolic Panel (BMET)   Meds ordered this encounter  Medications   buPROPion (WELLBUTRIN SR) 150 MG 12 hr tablet    Sig: Take 1 tablet (150 mg total) by mouth 2 (two) times daily. Start 150 mg daily then increase to twice daily after 3 days for smoking cessation. Can continue up to 12 weeks.    Dispense:  180 tablet    Refill:  0    Order Specific Question:   Supervising Provider    Answer:   Vesta Mixer [[id number]]   hydrALAZINE (APRESOLINE) 25 MG tablet    Sig: Take 1 tablet (25 mg total) by mouth 3 (three) times daily.    Dispense:  90 tablet    Refill:  2    DOSE CHANGE    Patient Instructions  Medication Instructions:   START Wellbutrin one (1) tablet by mouth ( 150 mg) X 3 days than INCREASE one (1) tablet by mouth (150 mg) twice daily up to 12 weeks.  INCREASE Hydralazine one (1) tablet by mouth ( 25 mg) three times daily.    *If you need a refill on your  cardiac medications before your next appointment, please call your pharmacy*  Lab  Work:  TODAY!!! BMET!!  If you have labs (blood work) drawn today and your tests are completely normal, you will receive your results only by: Fisher ScientificMyChart Message (if you have MyChart) OR A paper copy in the mail If you have any lab test that is abnormal or we need to change your treatment, we will call you to review the results.  Testing/Procedures:  None ordered.   Follow-Up: At [facility], you and your health needs are our priority.  As part of our continuing mission to provide you with exceptional heart care, we have created designated Provider Care Teams.  These Care Teams include your primary Cardiologist (physician) and Advanced Practice Providers (APPs -  Physician Assistants and Nurse Practitioners) who all work together to provide you with the care you need, when you need it.  We recommend signing up for the patient portal called "MyChart".  Sign up information is provided on this After Visit Summary.  MyChart is used to connect with patients for Virtual Visits (Telemedicine).  Patients are able to view lab/test results, encounter notes, upcoming appointments, etc.  Non-urgent messages can be sent to your provider as well.   To learn more about what you can do with MyChart, go to [email or web address].    Your next appointment:   2 month(s)  The format for your next appointment:   In Person  Provider:   Thomasene RippleKardie Tobb, DO     Important Information About Sugar         Signed, Levi AlandSwinyer, Nakenya Theall M, NP  06/05/2022 9:41 AM    [facility]

## 2022-06-04 ENCOUNTER — Ambulatory Visit: Payer: BC Managed Care – PPO | Admitting: Physician Assistant

## 2022-06-05 ENCOUNTER — Encounter: Payer: Self-pay | Admitting: Nurse Practitioner

## 2022-06-05 ENCOUNTER — Ambulatory Visit: Payer: BC Managed Care – PPO | Attending: Nurse Practitioner | Admitting: Nurse Practitioner

## 2022-06-05 VITALS — BP 156/64 | HR 49 | Ht 73.0 in | Wt 289.4 lb

## 2022-06-05 DIAGNOSIS — E7849 Other hyperlipidemia: Secondary | ICD-10-CM

## 2022-06-05 DIAGNOSIS — Z720 Tobacco use: Secondary | ICD-10-CM | POA: Diagnosis not present

## 2022-06-05 DIAGNOSIS — I10 Essential (primary) hypertension: Secondary | ICD-10-CM | POA: Diagnosis not present

## 2022-06-05 DIAGNOSIS — I441 Atrioventricular block, second degree: Secondary | ICD-10-CM | POA: Diagnosis not present

## 2022-06-05 DIAGNOSIS — I2699 Other pulmonary embolism without acute cor pulmonale: Secondary | ICD-10-CM

## 2022-06-05 LAB — BASIC METABOLIC PANEL
BUN/Creatinine Ratio: 21 — ABNORMAL HIGH (ref 9–20)
BUN: 17 mg/dL (ref 6–24)
CO2: 25 mmol/L (ref 20–29)
Calcium: 9.2 mg/dL (ref 8.7–10.2)
Chloride: 100 mmol/L (ref 96–106)
Creatinine, Ser: 0.81 mg/dL (ref 0.76–1.27)
Glucose: 128 mg/dL — ABNORMAL HIGH (ref 70–99)
Potassium: 3.8 mmol/L (ref 3.5–5.2)
Sodium: 138 mmol/L (ref 134–144)
eGFR: 105 mL/min/{1.73_m2} (ref >59)

## 2022-06-05 MED ORDER — HYDRALAZINE HCL 25 MG PO TABS: 25.0000 mg | ORAL_TABLET | Freq: Three times a day (TID) | ORAL | 2 refills | Status: DC

## 2022-06-05 MED ORDER — BUPROPION HCL ER (SR) 150 MG PO TB12: 150.0000 mg | ORAL_TABLET | Freq: Two times a day (BID) | ORAL | 0 refills | Status: DC

## 2022-06-05 NOTE — Patient Instructions (Signed)
Medication Instructions:   START Wellbutrin one (1) tablet by mouth ( 150 mg) X 3 days than INCREASE one (1) tablet by mouth (150 mg) twice daily up to 12 weeks.  INCREASE Hydralazine one (1) tablet by mouth ( 25 mg) three times daily.    *If you need a refill on your cardiac medications before your next appointment, please call your pharmacy*  Lab Work:  TODAY!!! BMET!!  If you have labs (blood work) drawn today and your tests are completely normal, you will receive your results only by: MyChart Message (if you have MyChart) OR A paper copy in the mail If you have any lab test that is abnormal or we need to change your treatment, we will call you to review the results.  Testing/Procedures:  None ordered.   Follow-Up: At [facility], you and your health needs are our priority.  As part of our continuing mission to provide you with exceptional heart care, we have created designated Provider Care Teams.  These Care Teams include your primary Cardiologist (physician) and Advanced Practice Providers (APPs -  Physician Assistants and Nurse Practitioners) who all work together to provide you with the care you need, when you need it.  We recommend signing up for the patient portal called "MyChart".  Sign up information is provided on this After Visit Summary.  MyChart is used to connect with patients for Virtual Visits (Telemedicine).  Patients are able to view lab/test results, encounter notes, upcoming appointments, etc.  Non-urgent messages can be sent to your provider as well.   To learn more about what you can do with MyChart, go to [email or web address].    Your next appointment:   2 month(s)  The format for your next appointment:   In Person  Provider:   Thomasene Ripple, DO     Important Information About Sugar

## 2022-06-10 ENCOUNTER — Telehealth: Payer: Self-pay

## 2022-06-10 NOTE — Telephone Encounter (Signed)
Spoke with patient regarding his symptoms. Patient states his feet have been swollen below the ankle and bruised over the last week. He states when he touches his foot it leaves a dent. He denies that his feet are cold. They are very painful and he had to stay home from work today.  He states he takes oxycodone for his back and it doesn't help the pain he feels when he tries to walk. He was recently in the hospital 05/26/22 for second degree heart block, PE and DVT. He was started on xarelto at that time. He reports he has been taking his xarelto as ordered. Discussed getting an appointment with Eligha Bridegroom, who he saw 06/05/22. I told him we would call him back to try and get him scheduled as Marcelino Duster is here Thursday and Friday this week.

## 2022-06-11 ENCOUNTER — Encounter: Payer: Self-pay | Admitting: Nurse Practitioner

## 2022-06-11 ENCOUNTER — Ambulatory Visit: Payer: BC Managed Care – PPO | Attending: Nurse Practitioner | Admitting: Nurse Practitioner

## 2022-06-11 VITALS — BP 180/82 | HR 48 | Ht 73.0 in | Wt 292.8 lb

## 2022-06-11 DIAGNOSIS — E785 Hyperlipidemia, unspecified: Secondary | ICD-10-CM | POA: Diagnosis not present

## 2022-06-11 DIAGNOSIS — I2782 Chronic pulmonary embolism: Secondary | ICD-10-CM

## 2022-06-11 DIAGNOSIS — I10 Essential (primary) hypertension: Secondary | ICD-10-CM | POA: Diagnosis not present

## 2022-06-11 DIAGNOSIS — I441 Atrioventricular block, second degree: Secondary | ICD-10-CM | POA: Diagnosis not present

## 2022-06-11 DIAGNOSIS — Z720 Tobacco use: Secondary | ICD-10-CM

## 2022-06-11 MED ORDER — RIVAROXABAN 20 MG PO TABS: 20.0000 mg | ORAL_TABLET | Freq: Every day | ORAL | 0 refills | Status: DC

## 2022-06-11 MED ORDER — HYDRALAZINE HCL 50 MG PO TABS: 50.0000 mg | ORAL_TABLET | Freq: Three times a day (TID) | ORAL | 3 refills | Status: DC

## 2022-06-11 NOTE — Telephone Encounter (Signed)
Pt has been added on [employer] schedule for further evaluation at 2:45 pm.  He is aware and agreeable to reports 15 mins prior to appt for check in.

## 2022-06-11 NOTE — Patient Instructions (Signed)
Medication Instructions:   INCREASE HYDRALAZINE one (1) tablet by mouth ( 50 mg) three times daily.   START Xarelto one (1) tablet by mouth ( 20 mg ) daily.   *If you need a refill on your cardiac medications before your next appointment, please call your pharmacy*   Lab Work:  None ordered.   If you have labs (blood work) drawn today and your tests are completely normal, you will receive your results only by: [place] (if you have MyChart) OR A paper copy in the mail If you have any lab test that is abnormal or we need to change your treatment, we will call you to review the results.   Testing/Procedures:  None ordered.    Follow-Up: At [facility], you and your health needs are our priority.  As part of our continuing mission to provide you with exceptional heart care, we have created designated Provider Care Teams.  These Care Teams include your primary Cardiologist (physician) and Advanced Practice Providers (APPs -  Physician Assistants and Nurse Practitioners) who all work together to provide you with the care you need, when you need it.  We recommend signing up for the patient portal called "MyChart".  Sign up information is provided on this After Visit Summary.  MyChart is used to connect with patients for Virtual Visits (Telemedicine).  Patients are able to view lab/test results, encounter notes, upcoming appointments, etc.  Non-urgent messages can be sent to your provider as well.   To learn more about what you can do with MyChart, go to [email or web address].    Your next appointment:   1 month(s)  The format for your next appointment:   In Person  Provider:   Crissie Sickles, MD    Other Instructions  Two Gram Sodium Diet 2000 mg  What is Sodium? Sodium is a mineral found naturally in many foods. The most significant source of sodium in the diet is table salt, which is about 40% sodium.  Processed, convenience, and preserved foods also contain a  large amount of sodium.  The body needs only 500 mg of sodium daily to function,  A normal diet provides more than enough sodium even if you do not use salt.  Why Limit Sodium? A build up of sodium in the body can cause thirst, increased blood pressure, shortness of breath, and water retention.  Decreasing sodium in the diet can reduce edema and risk of heart attack or stroke associated with high blood pressure.  Keep in mind that there are many other factors involved in these health problems.  Heredity, obesity, lack of exercise, cigarette smoking, stress and what you eat all play a role.  General Guidelines: Do not add salt at the table or in cooking.  One teaspoon of salt contains over 2 grams of sodium. Read food labels Avoid processed and convenience foods Ask your dietitian before eating any foods not dicussed in the menu planning guidelines Consult your physician if you wish to use a salt substitute or a sodium containing medication such as antacids.  Limit milk and milk products to 16 oz (2 cups) per day.  Shopping Hints: READ LABELS!! "Dietetic" does not necessarily mean low sodium. Salt and other sodium ingredients are often added to foods during processing.    Menu Planning Guidelines Food Group Choose More Often Avoid  Beverages (see also the milk group All fruit juices, low-sodium, salt-free vegetables juices, low-sodium carbonated beverages Regular vegetable or tomato juices, commercially softened water used for  drinking or cooking  Breads and Cereals Enriched white, wheat, rye and pumpernickel bread, hard rolls and dinner rolls; muffins, cornbread and waffles; most dry cereals, cooked cereal without added salt; unsalted crackers and breadsticks; low sodium or homemade bread crumbs Bread, rolls and crackers with salted tops; quick breads; instant hot cereals; pancakes; commercial bread stuffing; self-rising flower and biscuit mixes; regular bread crumbs or cracker crumbs  Desserts  and Sweets Desserts and sweets mad with mild should be within allowance Instant pudding mixes and cake mixes  Fats Butter or margarine; vegetable oils; unsalted salad dressings, regular salad dressings limited to 1 Tbs; light, sour and heavy cream Regular salad dressings containing bacon fat, bacon bits, and salt pork; snack dips made with instant soup mixes or processed cheese; salted nuts  Fruits Most fresh, frozen and canned fruits Fruits processed with salt or sodium-containing ingredient (some dried fruits are processed with sodium sulfites        Vegetables Fresh, frozen vegetables and low- sodium canned vegetables Regular canned vegetables, sauerkraut, pickled vegetables, and others prepared in brine; frozen vegetables in sauces; vegetables seasoned with ham, bacon or salt pork  Condiments, Sauces, Miscellaneous  Salt substitute with physician's approval; pepper, herbs, spices; vinegar, lemon or lime juice; hot pepper sauce; garlic powder, onion powder, low sodium soy sauce (1 Tbs.); low sodium condiments (ketchup, chili sauce, mustard) in limited amounts (1 tsp.) fresh ground horseradish; unsalted tortilla chips, pretzels, potato chips, popcorn, salsa (1/4 cup) Any seasoning made with salt including garlic salt, celery salt, onion salt, and seasoned salt; sea salt, rock salt, kosher salt; meat tenderizers; monosodium glutamate; mustard, regular soy sauce, barbecue, sauce, chili sauce, teriyaki sauce, steak sauce, Worcestershire sauce, and most flavored vinegars; canned gravy and mixes; regular condiments; salted snack foods, olives, picles, relish, horseradish sauce, catsup   Food preparation: Try these seasonings Meats:    Pork Sage, onion Serve with applesauce  Chicken Poultry seasoning, thyme, parsley Serve with cranberry sauce  Lamb Curry powder, rosemary, garlic, thyme Serve with mint sauce or jelly  Veal Marjoram, basil Serve with current jelly, cranberry sauce  Beef Pepper, bay leaf  Serve with dry mustard, unsalted chive butter  [place] leaf, dill Serve with unsalted lemon butter, unsalted parsley butter  Vegetables:    Asparagus Lemon juice   Broccoli Lemon juice   Carrots Mustard dressing parsley, mint, nutmeg, glazed with unsalted butter and sugar   Green beans Marjoram, lemon juice, nutmeg,dill seed   Tomatoes Basil, marjoram, onion   Spice /blend for [employer]" 4 tsp ground thyme 1 tsp ground sage 3 tsp ground rosemary 4 tsp ground marjoram   Test your knowledge A product that says "Salt Free" may still contain sodium. True or False Garlic Powder and Hot Pepper Sauce an be used as alternative seasonings.True or False Processed foods have more sodium than fresh foods.  True or False Canned Vegetables have less sodium than froze True or False   WAYS TO DECREASE YOUR SODIUM INTAKE Avoid the use of added salt in cooking and at the table.  Table salt (and other prepared seasonings which contain salt) is probably one of the greatest sources of sodium in the diet.  Unsalted foods can gain flavor from the sweet, sour, and butter taste sensations of herbs and spices.  Instead of using salt for seasoning, try the following seasonings with the foods listed.  Remember: how you use them to enhance natural food flavors is limited only by your creativity... Allspice-Meat, fish, eggs,  fruit, peas, red and yellow vegetables Almond Extract-Fruit baked goods Anise Seed-Sweet breads, fruit, carrots, beets, cottage cheese, cookies (tastes like licorice) Basil-Meat, fish, eggs, vegetables, rice, vegetables salads, soups, sauces Bay Leaf-Meat, fish, stews, poultry Burnet-Salad, vegetables (cucumber-like flavor) Caraway Seed-Bread, cookies, cottage cheese, meat, vegetables, cheese, rice Cardamon-Baked goods, fruit, soups Celery Powder or seed-Salads, salad dressings, sauces, meatloaf, soup, bread.Do not use  celery salt Chervil-Meats, salads, fish, eggs, vegetables, cottage  cheese (parsley-like flavor) Chili Power-Meatloaf, chicken cheese, corn, eggplant, egg dishes Chives-Salads cottage cheese, egg dishes, soups, vegetables, sauces Cilantro-Salsa, casseroles Cinnamon-Baked goods, fruit, pork, lamb, chicken, carrots Cloves-Fruit, baked goods, fish, pot roast, green beans, beets, carrots Coriander-Pastry, cookies, meat, salads, cheese (lemon-orange flavor) Cumin-Meatloaf, fish,cheese, eggs, cabbage,fruit pie (caraway flavor) [employer], fruit, eggs, fish, poultry, cottage cheese, vegetables Dill Seed-Meat, cottage cheese, poultry, vegetables, fish, salads, bread Fennel Seed-Bread, cookies, apples, pork, eggs, fish, beets, cabbage, cheese, Licorice-like flavor Garlic-(buds or powder) Salads, meat, poultry, fish, bread, butter, vegetables, potatoes.Do not  use garlic salt Ginger-Fruit, vegetables, baked goods, meat, fish, poultry Horseradish Root-Meet, vegetables, butter Lemon Juice or Extract-Vegetables, fruit, tea, baked goods, fish salads Mace-Baked goods fruit, vegetables, fish, poultry (taste like nutmeg) Maple Extract-Syrups Marjoram-Meat, chicken, fish, vegetables, breads, green salads (taste like Sage) Mint-Tea, lamb, sherbet, vegetables, desserts, carrots, cabbage Mustard, Dry or Seed-Cheese, eggs, meats, vegetables, poultry Nutmeg-Baked goods, fruit, chicken, eggs, vegetables, desserts Onion Powder-Meat, fish, poultry, vegetables, cheese, eggs, bread, rice salads (Do not use   Onion salt) Orange Extract-Desserts, baked goods Oregano-Pasta, eggs, cheese, onions, pork, lamb, fish, chicken, vegetables, green salads Paprika-Meat, fish, poultry, eggs, cheese, vegetables Parsley Flakes-Butter, vegetables, meat fish, poultry, eggs, bread, salads (certain forms may   Contain sodium Pepper-Meat fish, poultry, vegetables, eggs Peppermint Extract-Desserts, baked goods Poppy Seed-Eggs, bread, cheese, fruit dressings, baked goods, noodles, vegetables,  cottage  [employer], poultry, meat, fish, cauliflower, turnips,eggs bread Saffron-Rice, bread, veal, chicken, fish, eggs Sage-Meat, fish, poultry, onions, eggplant, tomateos, pork, stews Savory-Eggs, salads, poultry, meat, rice, vegetables, soups, pork Tarragon-Meat, poultry, fish, eggs, butter, vegetables (licorice-like flavor)  Thyme-Meat, poultry, fish, eggs, vegetables, (clover-like flavor), sauces, soups Tumeric-Salads, butter, eggs, fish, rice, vegetables (saffron-like flavor) Vanilla Extract-Baked goods, candy Vinegar-Salads, vegetables, meat marinades Walnut Extract-baked goods, candy   2. Choose your Foods Wisely   The following is a list of foods to avoid which are high in sodium:  Meats-Avoid all smoked, canned, salt cured, dried and kosher meat and fish as well as Anchovies   [employer] meats:Bologna, Liverwurst, Pastrami Canned meat or fish  Marinated herring Caviar    Pepperoni Corned Beef   Pizza Dried chipped beef  Salami Frozen breaded fish or meat Salt pork Frankfurters or hot dogs  Sardines Gefilte fish   Sausage Ham (boiled ham, Proscuitto Smoked butt    spiced ham)   Spam      TV Dinners Vegetables Canned vegetables (Regular) Relish Canned mushrooms  Sauerkraut Olives    Tomato juice Pickles  Bakery and Dessert Products Canned puddings  Cream pies Cheesecake   Decorated cakes Cookies  Beverages/Juices Tomato juice, regular  Gatorade   V-8 vegetable juice, regular  Breads and Cereals Biscuit mixes   Salted potato chips, corn chips, pretzels Bread stuffing mixes  Salted crackers and rolls Pancake and waffle mixes Self-rising flour  Seasonings Accent    Meat sauces Barbecue sauce  Meat tenderizer Catsup    Monosodium glutamate (MSG) Celery salt   Onion salt Chili sauce   Prepared mustard  Garlic salt   Salt, seasoned salt, sea salt Gravy mixes   Soy sauce Horseradish   Steak  sauce Ketchup   Tartar sauce Lite salt    Teriyaki sauce Marinade mixes   Worcestershire sauce  Others Baking powder   Cocoa and cocoa mixes Baking soda   Commercial casserole mixes Candy-caramels, chocolate  Dehydrated soups    Bars, fudge,nougats  Instant rice and pasta mixes Canned broth or soup  Maraschino cherries Cheese, aged and processed cheese and cheese spreads  Learning Assessment Quiz  Indicated T (for True) or F (for False) for each of the following statements:  _____ Fresh fruits and vegetables and unprocessed grains are generally low in sodium _____ Water may contain a considerable amount of sodium, depending on the source _____ You can always tell if a food is high in sodium by tasting it _____ Certain laxatives my be high in sodium and should be avoided unless prescribed   by a physician or pharmacist _____ Salt substitutes may be used freely by anyone on a sodium restricted diet _____ Sodium is present in table salt, food additives and as a natural component of   most foods _____ Table salt is approximately 90% sodium _____ Limiting sodium intake may help prevent excess fluid accumulation in the body _____ On a sodium-restricted diet, seasonings such as bouillon soy sauce, and    cooking wine should be used in place of table salt _____ On an ingredient list, a product which lists monosodium glutamate as the first   ingredient is an appropriate food to include on a low sodium diet  Circle the best answer(s) to the following statements (Hint: there may be more than one correct answer)  11. On a low-sodium diet, some acceptable snack items are:    A. Olives  F. Bean dip   K. Grapefruit juice    B. Salted Pretzels G. Commercial Popcorn   L. Canned peaches    C. Carrot Sticks  H. Bouillon   M. Unsalted nuts   D. [place] fries  I. Peanut butter crackers N. Salami   E. Sweet pickles J. Tomato Juice   O. Pizza  12.  Seasonings that may be used freely on a reduced -  sodium diet include   A. Lemon wedges F.Monosodium glutamate K. Celery seed    B.Soysauce   G. Pepper   L. Mustard powder   C. Sea salt  H. Cooking wine  M. Onion flakes   D. Vinegar  E. Prepared horseradish N. Salsa   E. Sage   J. Worcestershire sauce  O. Chutney   Important Information About Sugar

## 2022-06-11 NOTE — Progress Notes (Signed)
Cardiology Office Note:    Date:  06/11/2022   ID:  Christopher Green, DOB [birth year], MRN [id number]  PCP:  Patient, No Pcp Per   [facility] Providers Cardiologist:  Thomasene Ripple, DO     Referring MD: No ref. provider found   Chief Complaint: Hospital follow-up for hypertension  History of Present Illness:    Christopher Green is a very pleasant 51 y.o. male with a hx of chronic back pain followed by pain clinic, HTN, HLD, tobacco abuse, daily alcohol use, and abnormal nuclear stress test with normal cores on cath October 2019.  History of admission to [facility] September 2019 with elevated BP, troponin was elevated at the time. Subsequently seen by Dr. Vernona Rieger in [facility] and underwent [facility] 03/08/18 which was concerning for inferior ischemia.  Subsequent cardiac catheterization performed on 03/25/2018 showed minimal coronary artery disease (20% or less). The stress test was felt to be false positive and likely represent diaphragmatic attenuation artifact, elevated troponin felt 2/2 uncontrolled BP. He was prescribed amlodipine-valsartan (Exforge), Coreg, hydralazine, HCTZ, and spironolactone. He was to follow-up with cardiology service as needed. He was taken off Exforge and carvedilol over the years. He lost his insurance and ran out of medications 2 weeks prior to admission.   Cardiology consult during admission on 05/26/2022 for evaluation of second-degree heart block. Presented to [facility] on 12/11 with dyspnea, thought he was developing a URI. On arrival, systolic BP was over 200 and his heart rate was in the 40s.  Serial troponin 61 ? 82. CTA of the chest abdomen and pelvis was negative for acute aortic dissection or aneurysm, however did reveal acute segmental and subsegmental PE in the left lower lobe and right upper lobe, no evidence for right heart strain.  Ultrasound positive for DVT LLE. 2D echo revealed EF 65 to 70%, severe asymmetric LVH of the septal segment, normal RV,  small pericardial effusion without significant tamponade with LVH felt likely related to HTN, not recommended to undergo cMRI at this time. Placed on irbesartan with transition to valsartan 320 mg at discharge , hydralazine 25 mg BID, spironolactone. Hypokalemia and hypomagnesemia improved during admission. Suspicion for OSA/sleep disordered breathing, plan for OP sleep study. Concern for HB - predominantly NSR and second-degree AVB type I while awake, higher grade type II/possible brief CHB during sleeping hours also appears to have an ectopic atrial rhythm from different focus with higher atrial rate and intermittent 2-1 block though HR 70s during this time, some wandering pacemaker as well.  No high risk presyncope, syncope reported.  Discussed with EP, may be related to untreated OSA, vagal tone.  Given asymptomatic nature they do not feel acute intervention or PPM needed at this time. They do not recommend monitor given frequency of events seen on telemetry during admission. Plan to follow-up with EP as an outpatient.  Seen by me on 06/05/22 for hospital follow-up. Has felt improvement each day since discharge. Having less DOE, can walk farther distance without significant shortness of breath. Felt sensation like heart was quivering on 2 occasions, each of those were stressful situations. 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 He denied chest pain, edema, orthopnea, PND. Back pain makes sleeping difficult so he sometimes sleeps in a recliner. Has smart watch for monitoring HR and notes variations not consistent with exertion, sometimes HR is low when walking and sometimes high at rest. Monitors home BP but does not have any readings to  review. BP was 140/70 at pain clinic on 12/18. No dizziness, lightheadedness, presyncope, syncope. No bleeding concerns.   He called 06/10/2022 to report swelling below the ankle and bruising over the previous week. When he touches his foot it leaves  a dent. Feet are very painful and have prevented him from working. Was placed on my schedule.  Today, he is here for evaluation of bilateral foot swelling as noted above. He points to the top part of his foot which has some mild bruising. Reports both feet hurt, left worse than right. Took off work the past 2 days to see if symptoms improved. Has maybe missed Xarelto and hydralazine on one occasion each. No chest pain, shortness of breath only when trying to walk fast. No orthopnea, PND, presyncope, syncope. We discussed diet and he thought he was limiting sodium by not adding salt to his food. Eats a pretty high sodium diet including country ham, bacon, Hamburger helper, french fries. BP remains very high.   Past Medical History:  Diagnosis Date   Hypertension     Past Surgical History:  Procedure Laterality Date   CARDIAC CATHETERIZATION  2019   clean cors    Current Medications: Current Meds  Medication Sig   atorvastatin (LIPITOR) 40 MG tablet Take 1 tablet (40 mg total) by mouth daily.   buprenorphine (BUTRANS) 15 MCG/HR Place 1 patch onto the skin once a week.   buPROPion (WELLBUTRIN SR) 150 MG 12 hr tablet Take 1 tablet (150 mg total) by mouth 2 (two) times daily. Start 150 mg daily then increase to twice daily after 3 days for smoking cessation. Can continue up to 12 weeks.   metFORMIN (GLUCOPHAGE-XR) 500 MG 24 hr tablet Take 500 mg by mouth daily with breakfast.   oxyCODONE-acetaminophen (PERCOCET) 10-325 MG tablet Take 1 tablet by mouth every 6 (six) hours as needed for pain.   rivaroxaban (XARELTO) 20 MG TABS tablet Take 1 tablet (20 mg total) by mouth daily with supper.   spironolactone (ALDACTONE) 25 MG tablet Take 1 tablet (25 mg total) by mouth daily.   testosterone cypionate (DEPOTESTOSTERONE CYPIONATE) 200 MG/ML injection Inject 200 mg into the muscle every 14 (fourteen) days.   valsartan (DIOVAN) 320 MG tablet Take 1 tablet (320 mg total) by mouth daily.   [DISCONTINUED]  hydrALAZINE (APRESOLINE) 25 MG tablet Take 1 tablet (25 mg total) by mouth 3 (three) times daily.   [DISCONTINUED] RIVAROXABAN (XARELTO) VTE STARTER PACK (15 & 20 MG) Follow package directions: Take one 15mg  tablet by mouth twice a day. On day 22, switch to one 20mg  tablet once a day. Take with food.     Allergies:   Patient has no known allergies.   Social History   Socioeconomic History   Marital status: Divorced    Spouse name: Not on file   Number of children: Not on file   Years of education: Not on file   Highest education level: Not on file  Occupational History   Not on file  Tobacco Use   Smoking status: Every Day    Types: Cigarettes   Smokeless tobacco: Never  Substance and Sexual Activity   Alcohol use: Yes    Comment: daily   Drug use: Never   Sexual activity: Not on file  Other Topics Concern   Not on file  Social History Narrative   Not on file   Social Determinants of Health   Financial Resource Strain: Not on file  Food Insecurity: No Food Insecurity (  05/25/2022)   Hunger Vital Sign    Worried About Running Out of Food in the Last Year: Never true    Ran Out of Food in the Last Year: Never true  Transportation Needs: No Transportation Needs (05/25/2022)   PRAPARE - Administrator, Civil Service (Medical): No    Lack of Transportation (Non-Medical): No  Physical Activity: Not on file  Stress: Not on file  Social Connections: Not on file     Family History: The patient's family history includes Heart attack in his father.  ROS:   Please see the history of present illness.   + chronic DOE + bilateral foot pain All other systems reviewed and are negative.  Labs/Other Studies Reviewed:    The following studies were reviewed today:  CTA Chest/Abd/Pelvis 05/25/22 1. No evidence for aortic dissection or aneurysm. 2. Acute segmental and subsegmental pulmonary emboli in the left lower lobe and right upper lobe. No evidence for right  heart strain. 3. Enlarged precarinal lymph node measuring 17 mm, indeterminate. 4. No acute process in the abdomen or pelvis.   Aortic Atherosclerosis (ICD10-I70.0).  VAS US DVT 05/26/22 Summary:  BILATERAL:  -No evidence of popliteal cyst, bilaterally.  RIGHT:  - There is no evidence of deep vein thrombosis in the lower extremity.    LEFT:  - Findings consistent with acute deep vein thrombosis involving the left  posterior tibial veins, and left peroneal veins.   Echo 05/26/22 1. Left ventricular ejection fraction, by estimation, is 65 to 70%. The  left ventricle has normal function. The left ventricle has no regional  wall motion abnormalities. The left ventricular internal cavity size was  mildly dilated. There is severe  asymmetric left ventricular hypertrophy of the septal segment. Left  ventricular diastolic parameters are indeterminate.   2. Right ventricular systolic function is normal. The right ventricular  size is normal. Tricuspid regurgitation signal is inadequate for assessing  PA pressure.   3. Left atrial size was mildly dilated.   4. A small pericardial effusion is present. The pericardial effusion is  posterior to the left ventricle. There is no evidence of cardiac  tamponade.   5. The mitral valve is normal in structure. No evidence of mitral valve  regurgitation. No evidence of mitral stenosis.   6. The aortic valve is tricuspid. There is mild calcification of the  aortic valve. Aortic valve regurgitation is not visualized.   7. The inferior vena cava is normal in size with greater than 50%  respiratory variability, suggesting right atrial pressure of 3 mmHg.   Comparison(s): No prior Echocardiogram.  Cath 03/25/2018 Procedure  Procedure Type   Diagnostic procedure: CARDIAC CATH  Complications: No Complications.  Conclusions  Diagnostic Procedure Summary  INDICATION: Abnormal troponin, abnormal nuclear stress.  1. Mild, nonobstructive CAD.  2.  Normal LV systolic function, EF 65%  3. Normal LV regional wall motion.  Diagnostic Procedure Recommendations  The abnormal troponin was likely secondary to the severely elevated blood  pressure the patient had at the time.  The nuclear stress test result is a false positive, likely due to  diaphragmatic attenuation.  A healthy lifestyle was recommended.  The patient will continue to follow up with his PCP regarding primary  prevention measures.   Signatures   Electronically signed by Tollie Pizza, DO, FACC(Diagnostic   Physician) on 03/25/2018 09:18   Angiographic findings   Cardiac Arteries and Lesion Findings  LMCA: Normal appearance with 0% stenosis.  LAD: Abnormal.  Proximal - 20%  Mid - 20% diffuse  Diagonal - 20%  LCx: Abnormal.  Proximal - 20%  RCA: Abnormal.  Proximal - 20%     Recent Labs: 05/25/2022: B Natriuretic Peptide 79.2 05/26/2022: TSH 1.065 05/27/2022: Hemoglobin 15.1; Platelets 206 05/28/2022: Magnesium 2.2 06/05/2022: BUN 17; Creatinine, Ser 0.81; Potassium 3.8; Sodium 138  Recent Lipid Panel No results found for: "CHOL", "TRIG", "HDL", "CHOLHDL", "VLDL", "LDLCALC", "LDLDIRECT"   Risk Assessment/Calculations:      Physical Exam:    VS:  BP (!) 180/82   Pulse (!) 48   Ht  (1.854 m)   Wt 292 lb 12.8 oz (132.8 kg)   SpO2 98%   BMI 38.63 kg/m     Wt Readings from Last 3 Encounters:  06/11/22 292 lb 12.8 oz (132.8 kg)  06/05/22 289 lb 6.4 oz (131.3 kg)  05/25/22 290 lb (131.5 kg)     GEN: Obese, well developed in no acute distress HEENT: Normal NECK: No JVD; No carotid bruits CARDIAC: RRR, no murmurs, rubs, gallops RESPIRATORY:  Clear to auscultation without rales, wheezing or rhonchi  ABDOMEN: Soft, non-tender, non-distended MUSCULOSKELETAL:  Left foot with minor bruising dorsal surface. No significant swelling. No deformity. 2+ pedal pulses, equal bilaterally SKIN: Warm and dry NEUROLOGIC:  Alert and oriented x 3 PSYCHIATRIC:   Normal affect   EKG:  EKG is not ordered today.    HYPERTENSION CONTROL Vitals:   06/11/22 1446 06/11/22 1514  BP: (!) 186/78 (!) 180/82    The patient's blood pressure is elevated above target today.  In order to address the patient's elevated BP: A current anti-hypertensive medication was adjusted today.     Diagnoses:    1. Essential hypertension   2. Chronic pulmonary embolism without acute cor pulmonale, unspecified pulmonary embolism type (HCC)   3. Second degree AV block   4. Hyperlipidemia LDL goal <70   5. Tobacco use     Assessment and Plan:     Hypertension: BP remains elevated today. Reports elevated at home over the past few days as well. No improvement with increase of hydralazine to 25 mg 3 times daily on 12/22. Hypertensive urgency during admission 05/2022. No renal artery stenosis on CT 12/11. Aldosterone and renin negative for hyperaldosteronism 12/13. Avoiding beta-blocker and amlodipine 2/2 conduction disease. Avoiding diuretic 2/2 hypokalemia. We will increase his hydralazine to 50 mg 3 times daily. He says taking mid day dose will not be a problem. Advised him to notify us if BP consistently > 140 or > 88 at home. Consider increasing spironolactone if kidney function remains stable. Emphasized the importance of limiting sodium  < 2000 mg daily.   2nd degree HB: Continues to have episodes of bradycardia and tachycardia on Smart watch. No dizziness, presyncope, syncope. Has consult scheduled for 06/1822 with EP, Dr. Ladona Ridgel. Avoiding AV nodal blocking agents.   Tobacco abuse: On Wellbutrin for smoking cessation. Not directly addressed today.    PE: Unprovoked DVT/PE found during admission 05/2022. Dyspnea continues to improve, only SOB when walking fast. Has mild swelling LLE. No bleeding problems on Xarelto, thinks he has only missed one dose. Will refill Xarelto 20 mg for 90 days. Refer to hematology for evaluation of clotting disorder.   Hyperlipidemia:  Recently started on atorvastatin during hospitalization. Will need fasting lipid and liver panel in 2 months.      Disposition: Keep your January appointment with Dr. Ladona Ridgel and February appointment with Dr. Servando Salina  Medication Adjustments/Labs and Tests Ordered:  Current medicines are reviewed at length with the patient today.  Concerns regarding medicines are outlined above.  Orders Placed This Encounter  Procedures   Ambulatory referral to Hematology / Oncology   Meds ordered this encounter  Medications   hydrALAZINE (APRESOLINE) 50 MG tablet    Sig: Take 1 tablet (50 mg total) by mouth 3 (three) times daily.    Dispense:  270 tablet    Refill:  3    DOSE CHANGE   rivaroxaban (XARELTO) 20 MG TABS tablet    Sig: Take 1 tablet (20 mg total) by mouth daily with supper.    Dispense:  90 tablet    Refill:  0    Patient Instructions  Medication Instructions:   INCREASE HYDRALAZINE one (1) tablet by mouth ( 50 mg) three times daily.   START Xarelto one (1) tablet by mouth ( 20 mg ) daily.   *If you need a refill on your cardiac medications before your next appointment, please call your pharmacy*   Lab Work:  None ordered.   If you have labs (blood work) drawn today and your tests are completely normal, you will receive your results only by: MyChart Message (if you have MyChart) OR A paper copy in the mail If you have any lab test that is abnormal or we need to change your treatment, we will call you to review the results.   Testing/Procedures:  None ordered.    Follow-Up: At [facility], you and your health needs are our priority.  As part of our continuing mission to provide you with exceptional heart care, we have created designated Provider Care Teams.  These Care Teams include your primary Cardiologist (physician) and Advanced Practice Providers (APPs -  Physician Assistants and Nurse Practitioners) who all work together to provide you with the care you need,  when you need it.  We recommend signing up for the patient portal called "MyChart".  Sign up information is provided on this After Visit Summary.  MyChart is used to connect with patients for Virtual Visits (Telemedicine).  Patients are able to view lab/test results, encounter notes, upcoming appointments, etc.  Non-urgent messages can be sent to your provider as well.   To learn more about what you can do with MyChart, go to [email or web address].    Your next appointment:   1 month(s)  The format for your next appointment:   In Person  Provider:   Sharrell KuGreg Taylor, MD    Other Instructions  Two Gram Sodium Diet 2000 mg  What is Sodium? Sodium is a mineral found naturally in many foods. The most significant source of sodium in the diet is table salt, which is about 40% sodium.  Processed, convenience, and preserved foods also contain a large amount of sodium.  The body needs only 500 mg of sodium daily to function,  A normal diet provides more than enough sodium even if you do not use salt.  Why Limit Sodium? A build up of sodium in the body can cause thirst, increased blood pressure, shortness of breath, and water retention.  Decreasing sodium in the diet can reduce edema and risk of heart attack or stroke associated with high blood pressure.  Keep in mind that there are many other factors involved in these health problems.  Heredity, obesity, lack of exercise, cigarette smoking, stress and what you eat all play a role.  General Guidelines: Do not add salt at the table or in cooking.  One teaspoon of salt  contains over 2 grams of sodium. Read food labels Avoid processed and convenience foods Ask your dietitian before eating any foods not dicussed in the menu planning guidelines Consult your physician if you wish to use a salt substitute or a sodium containing medication such as antacids.  Limit milk and milk products to 16 oz (2 cups) per day.  Shopping Hints: READ LABELS!! "Dietetic"  does not necessarily mean low sodium. Salt and other sodium ingredients are often added to foods during processing.    Menu Planning Guidelines Food Group Choose More Often Avoid  Beverages (see also the milk group All fruit juices, low-sodium, salt-free vegetables juices, low-sodium carbonated beverages Regular vegetable or tomato juices, commercially softened water used for drinking or cooking  Breads and Cereals Enriched white, wheat, rye and pumpernickel bread, hard rolls and dinner rolls; muffins, cornbread and waffles; most dry cereals, cooked cereal without added salt; unsalted crackers and breadsticks; low sodium or homemade bread crumbs Bread, rolls and crackers with salted tops; quick breads; instant hot cereals; pancakes; commercial bread stuffing; self-rising flower and biscuit mixes; regular bread crumbs or cracker crumbs  Desserts and Sweets Desserts and sweets mad with mild should be within allowance Instant pudding mixes and cake mixes  Fats Butter or margarine; vegetable oils; unsalted salad dressings, regular salad dressings limited to 1 Tbs; light, sour and heavy cream Regular salad dressings containing bacon fat, bacon bits, and salt pork; snack dips made with instant soup mixes or processed cheese; salted nuts  Fruits Most fresh, frozen and canned fruits Fruits processed with salt or sodium-containing ingredient (some dried fruits are processed with sodium sulfites        Vegetables Fresh, frozen vegetables and low- sodium canned vegetables Regular canned vegetables, sauerkraut, pickled vegetables, and others prepared in brine; frozen vegetables in sauces; vegetables seasoned with ham, bacon or salt pork  Condiments, Sauces, Miscellaneous  Salt substitute with physician's approval; pepper, herbs, spices; vinegar, lemon or lime juice; hot pepper sauce; garlic powder, onion powder, low sodium soy sauce (1 Tbs.); low sodium condiments (ketchup, chili sauce, mustard) in limited  amounts (1 tsp.) fresh ground horseradish; unsalted tortilla chips, pretzels, potato chips, popcorn, salsa (1/4 cup) Any seasoning made with salt including garlic salt, celery salt, onion salt, and seasoned salt; sea salt, rock salt, kosher salt; meat tenderizers; monosodium glutamate; mustard, regular soy sauce, barbecue, sauce, chili sauce, teriyaki sauce, steak sauce, Worcestershire sauce, and most flavored vinegars; canned gravy and mixes; regular condiments; salted snack foods, olives, picles, relish, horseradish sauce, catsup   Food preparation: Try these seasonings Meats:    Pork Sage, onion Serve with applesauce  Chicken Poultry seasoning, thyme, parsley Serve with cranberry sauce  Lamb Curry powder, rosemary, garlic, thyme Serve with mint sauce or jelly  Veal Marjoram, basil Serve with current jelly, cranberry sauce  Beef Pepper, bay leaf Serve with dry mustard, unsalted chive butter  Fish Bay leaf, dill Serve with unsalted lemon butter, unsalted parsley butter  Vegetables:    Asparagus Lemon juice   Broccoli Lemon juice   Carrots Mustard dressing parsley, mint, nutmeg, glazed with unsalted butter and sugar   Green beans Marjoram, lemon juice, nutmeg,dill seed   Tomatoes Basil, marjoram, onion   Spice /blend for [employer]" 4 tsp ground thyme 1 tsp ground sage 3 tsp ground rosemary 4 tsp ground marjoram   Test your knowledge A product that says "Salt Free" may still contain sodium. True or False Garlic Powder and Hot Pepper Sauce an be  used as alternative seasonings.True or False Processed foods have more sodium than fresh foods.  True or False Canned Vegetables have less sodium than froze True or False   WAYS TO DECREASE YOUR SODIUM INTAKE Avoid the use of added salt in cooking and at the table.  Table salt (and other prepared seasonings which contain salt) is probably one of the greatest sources of sodium in the diet.  Unsalted foods can gain flavor from the sweet, sour, and  butter taste sensations of herbs and spices.  Instead of using salt for seasoning, try the following seasonings with the foods listed.  Remember: how you use them to enhance natural food flavors is limited only by your creativity... Allspice-Meat, fish, eggs, fruit, peas, red and yellow vegetables Almond Extract-Fruit baked goods Anise Seed-Sweet breads, fruit, carrots, beets, cottage cheese, cookies (tastes like licorice) Basil-Meat, fish, eggs, vegetables, rice, vegetables salads, soups, sauces Bay Leaf-Meat, fish, stews, poultry Burnet-Salad, vegetables (cucumber-like flavor) Caraway Seed-Bread, cookies, cottage cheese, meat, vegetables, cheese, rice Cardamon-Baked goods, fruit, soups Celery Powder or seed-Salads, salad dressings, sauces, meatloaf, soup, bread.Do not use  celery salt Chervil-Meats, salads, fish, eggs, vegetables, cottage cheese (parsley-like flavor) Chili Power-Meatloaf, chicken cheese, corn, eggplant, egg dishes Chives-Salads cottage cheese, egg dishes, soups, vegetables, sauces Cilantro-Salsa, casseroles Cinnamon-Baked goods, fruit, pork, lamb, chicken, carrots Cloves-Fruit, baked goods, fish, pot roast, green beans, beets, carrots Coriander-Pastry, cookies, meat, salads, cheese (lemon-orange flavor) Cumin-Meatloaf, fish,cheese, eggs, cabbage,fruit pie (caraway flavor) [employer], fruit, eggs, fish, poultry, cottage cheese, vegetables Dill Seed-Meat, cottage cheese, poultry, vegetables, fish, salads, bread Fennel Seed-Bread, cookies, apples, pork, eggs, fish, beets, cabbage, cheese, Licorice-like flavor Garlic-(buds or powder) Salads, meat, poultry, fish, bread, butter, vegetables, potatoes.Do not  use garlic salt Ginger-Fruit, vegetables, baked goods, meat, fish, poultry Horseradish Root-Meet, vegetables, butter Lemon Juice or Extract-Vegetables, fruit, tea, baked goods, fish salads Mace-Baked goods fruit, vegetables, fish, poultry (taste like nutmeg) Maple  Extract-Syrups Marjoram-Meat, chicken, fish, vegetables, breads, green salads (taste like Sage) Mint-Tea, lamb, sherbet, vegetables, desserts, carrots, cabbage Mustard, Dry or Seed-Cheese, eggs, meats, vegetables, poultry Nutmeg-Baked goods, fruit, chicken, eggs, vegetables, desserts Onion Powder-Meat, fish, poultry, vegetables, cheese, eggs, bread, rice salads (Do not use   Onion salt) Orange Extract-Desserts, baked goods Oregano-Pasta, eggs, cheese, onions, pork, lamb, fish, chicken, vegetables, green salads Paprika-Meat, fish, poultry, eggs, cheese, vegetables Parsley Flakes-Butter, vegetables, meat fish, poultry, eggs, bread, salads (certain forms may   Contain sodium Pepper-Meat fish, poultry, vegetables, eggs Peppermint Extract-Desserts, baked goods Poppy Seed-Eggs, bread, cheese, fruit dressings, baked goods, noodles, vegetables, cottage  [employer], poultry, meat, fish, cauliflower, turnips,eggs bread Saffron-Rice, bread, veal, chicken, fish, eggs Sage-Meat, fish, poultry, onions, eggplant, tomateos, pork, stews Savory-Eggs, salads, poultry, meat, rice, vegetables, soups, pork Tarragon-Meat, poultry, fish, eggs, butter, vegetables (licorice-like flavor)  Thyme-Meat, poultry, fish, eggs, vegetables, (clover-like flavor), sauces, soups Tumeric-Salads, butter, eggs, fish, rice, vegetables (saffron-like flavor) Vanilla Extract-Baked goods, candy Vinegar-Salads, vegetables, meat marinades Walnut Extract-baked goods, candy   2. Choose your Foods Wisely   The following is a list of foods to avoid which are high in sodium:  Meats-Avoid all smoked, canned, salt cured, dried and kosher meat and fish as well as Anchovies   [employer] meats:Bologna, Liverwurst, Pastrami Canned meat or fish  Marinated herring Caviar    Pepperoni Corned Beef   Pizza Dried chipped beef  Salami Frozen breaded fish or meat Salt pork Frankfurters or  hot dogs  Sardines Gefilte fish   Sausage Ham (boiled  ham, Proscuitto Smoked butt    spiced ham)   Spam      TV Dinners Vegetables Canned vegetables (Regular) Relish Canned mushrooms  Sauerkraut Olives    Tomato juice Pickles  Bakery and Dessert Products Canned puddings  Cream pies Cheesecake   Decorated cakes Cookies  Beverages/Juices Tomato juice, regular  Gatorade   V-8 vegetable juice, regular  Breads and Cereals Biscuit mixes   Salted potato chips, corn chips, pretzels Bread stuffing mixes  Salted crackers and rolls Pancake and waffle mixes Self-rising flour  Seasonings Accent    Meat sauces Barbecue sauce  Meat tenderizer Catsup    Monosodium glutamate (MSG) Celery salt   Onion salt Chili sauce   Prepared mustard Garlic salt   Salt, seasoned salt, sea salt Gravy mixes   Soy sauce Horseradish   Steak sauce Ketchup   Tartar sauce Lite salt    Teriyaki sauce Marinade mixes   Worcestershire sauce  Others Baking powder   Cocoa and cocoa mixes Baking soda   Commercial casserole mixes Candy-caramels, chocolate  Dehydrated soups    Bars, fudge,nougats  Instant rice and pasta mixes Canned broth or soup  Maraschino cherries Cheese, aged and processed cheese and cheese spreads  Learning Assessment Quiz  Indicated T (for True) or F (for False) for each of the following statements:  _____ Fresh fruits and vegetables and unprocessed grains are generally low in sodium _____ Water may contain a considerable amount of sodium, depending on the source _____ You can always tell if a food is high in sodium by tasting it _____ Certain laxatives my be high in sodium and should be avoided unless prescribed   by a physician or pharmacist _____ Salt substitutes may be used freely by anyone on a sodium restricted diet _____ Sodium is present in table salt, food additives and as a natural component of   most foods _____ Table salt is approximately 90% sodium _____ Limiting sodium  intake may help prevent excess fluid accumulation in the body _____ On a sodium-restricted diet, seasonings such as bouillon soy sauce, and    cooking wine should be used in place of table salt _____ On an ingredient list, a product which lists monosodium glutamate as the first   ingredient is an appropriate food to include on a low sodium diet  Circle the best answer(s) to the following statements (Hint: there may be more than one correct answer)  11. On a low-sodium diet, some acceptable snack items are:    A. Olives  F. Bean dip   K. Grapefruit juice    B. Salted Pretzels G. Commercial Popcorn   L. Canned peaches    C. Carrot Sticks  H. Bouillon   M. Unsalted nuts   D. [place] fries  I. Peanut butter crackers N. Salami   E. Sweet pickles J. Tomato Juice   O. Pizza  12.  Seasonings that may be used freely on a reduced - sodium diet include   A. Lemon wedges F.Monosodium glutamate K. Celery seed    B.Soysauce   G. Pepper   L. Mustard powder   C. Sea salt  H. Cooking wine  M. Onion flakes   D. Vinegar  E. Prepared horseradish N. Salsa   E. Sage   J. Worcestershire sauce  O. Chutney   Important Information About Sugar         Signed, Levi Aland, NP  06/11/2022 3:41 PM    [facility]

## 2022-06-20 ENCOUNTER — Inpatient Hospital Stay: Payer: BC Managed Care – PPO

## 2022-06-20 ENCOUNTER — Inpatient Hospital Stay: Payer: BC Managed Care – PPO | Attending: Hematology and Oncology | Admitting: Hematology and Oncology

## 2022-06-20 ENCOUNTER — Other Ambulatory Visit: Payer: Self-pay

## 2022-06-20 VITALS — BP 218/86 | HR 45 | Temp 97.8°F | Resp 17 | Wt 293.7 lb

## 2022-06-20 DIAGNOSIS — F17210 Nicotine dependence, cigarettes, uncomplicated: Secondary | ICD-10-CM | POA: Diagnosis not present

## 2022-06-20 DIAGNOSIS — I2694 Multiple subsegmental pulmonary emboli without acute cor pulmonale: Secondary | ICD-10-CM | POA: Diagnosis not present

## 2022-06-20 DIAGNOSIS — E6609 Other obesity due to excess calories: Secondary | ICD-10-CM | POA: Insufficient documentation

## 2022-06-20 DIAGNOSIS — Z7901 Long term (current) use of anticoagulants: Secondary | ICD-10-CM | POA: Diagnosis not present

## 2022-06-20 DIAGNOSIS — I2699 Other pulmonary embolism without acute cor pulmonale: Secondary | ICD-10-CM

## 2022-06-20 DIAGNOSIS — I82402 Acute embolism and thrombosis of unspecified deep veins of left lower extremity: Secondary | ICD-10-CM | POA: Diagnosis not present

## 2022-06-20 DIAGNOSIS — I10 Essential (primary) hypertension: Secondary | ICD-10-CM | POA: Diagnosis not present

## 2022-06-20 NOTE — Progress Notes (Signed)
[facility] CONSULT NOTE  Patient Care Team: Patient, No Pcp Per as PCP - General (General Practice) Tobb, Lavona Mound, DO as PCP - Cardiology (Cardiology)  CHIEF COMPLAINTS/PURPOSE OF CONSULTATION:  DVT and PE  HISTORY OF PRESENTING ILLNESS:  Christopher Green 52 y.o. male is here because of recent diagnosis of DVT and PE.  Patient presented to the emergency room at [facility] with complaints of chest tightness and shortness of breath and was found to have bilateral subsegmental pulmonary emboli along with left leg DVT.  He was prescribed Xarelto and was referred to [place] for discussion regarding hypercoagulability.  He does not have any family history of blood clots.  He has never had a history of blood clot.  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  He does however smoke 1 pack a day and he is fairly obese.  I reviewed her records extensively and collaborated the history with the patient.   MEDICAL HISTORY:  Past Medical History:  Diagnosis Date   Hypertension     SURGICAL HISTORY: Past Surgical History:  Procedure Laterality Date   CARDIAC CATHETERIZATION  2019   clean cors    SOCIAL HISTORY: Social History   Socioeconomic History   Marital status: Married    Spouse name: Not on file   Number of children: Not on file   Years of education: Not on file   Highest education level: Not on file  Occupational History   Not on file  Tobacco Use   Smoking status: Every Day    Types: Cigarettes   Smokeless tobacco: Never  Substance and Sexual Activity   Alcohol use: Yes    Comment: daily   Drug use: Never   Sexual activity: Not on file  Other Topics Concern   Not on file  Social History Narrative   Not on file   Social Determinants of Health   Financial Resource Strain: Not on file  Food Insecurity: No Food Insecurity (05/25/2022)   Hunger Vital Sign    Worried About Running Out of Food in  the Last Year: Never true    Ran Out of Food in the Last Year: Never true  Transportation Needs: No Transportation Needs (05/25/2022)   PRAPARE - Administrator, Civil Service (Medical): No    Lack of Transportation (Non-Medical): No  Physical Activity: Not on file  Stress: Not on file  Social Connections: Not on file  Intimate Partner Violence: Not At Risk (05/25/2022)   Humiliation, Afraid, Rape, and Kick questionnaire    Fear of Current or Ex-Partner: No    Emotionally Abused: No    Physically Abused: No    Sexually Abused: No    FAMILY HISTORY: Family History  Problem Relation Age of Onset   Heart attack Father     ALLERGIES:  has No Known Allergies.  MEDICATIONS:  Current Outpatient Medications  Medication Sig Dispense Refill   atorvastatin (LIPITOR) 40 MG tablet Take 1 tablet (40 mg total) by mouth daily. 30 tablet 2   buprenorphine (BUTRANS) 15 MCG/HR Place 1 patch onto the skin once a week.     buPROPion (WELLBUTRIN SR) 150 MG 12 hr tablet Take 1 tablet (150 mg total) by mouth 2 (two) times daily. Start 150 mg daily then increase to twice daily after 3 days for smoking cessation. Can continue up to 12 weeks. 180 tablet 0  hydrALAZINE (APRESOLINE) 50 MG tablet Take 1 tablet (50 mg total) by mouth 3 (three) times daily. 270 tablet 3   metFORMIN (GLUCOPHAGE-XR) 500 MG 24 hr tablet Take 500 mg by mouth daily with breakfast.     oxyCODONE-acetaminophen (PERCOCET) 10-325 MG tablet Take 1 tablet by mouth every 6 (six) hours as needed for pain.     rivaroxaban (XARELTO) 20 MG TABS tablet Take 1 tablet (20 mg total) by mouth daily with supper. 90 tablet 0   spironolactone (ALDACTONE) 25 MG tablet Take 1 tablet (25 mg total) by mouth daily. 30 tablet 2   testosterone cypionate (DEPOTESTOSTERONE CYPIONATE) 200 MG/ML injection Inject 200 mg into the muscle every 14 (fourteen) days.     valsartan (DIOVAN) 320 MG tablet Take 1 tablet (320 mg total) by mouth daily. 30 tablet  2   No current facility-administered medications for this visit.    REVIEW OF SYSTEMS:   Constitutional: Denies fevers, chills or abnormal night sweats All other systems were reviewed with the patient and are negative.  PHYSICAL EXAMINATION: ECOG PERFORMANCE STATUS: 0 - Asymptomatic  Vitals:   06/20/22 1020  BP: (!) 218/86  Pulse: (!) 45  Resp: 17  Temp: 97.8 F (36.6 C)  SpO2: 96%   Filed Weights   06/20/22 1020  Weight: 293 lb 11.2 oz (133.2 kg)    GENERAL:alert, no distress and comfortable  LABORATORY DATA:  I have reviewed the data as listed Lab Results  Component Value Date   WBC 9.2 05/27/2022   HGB 15.1 05/27/2022   HCT 48.4 05/27/2022   MCV 98.4 05/27/2022   PLT 206 05/27/2022   Lab Results  Component Value Date   NA 138 06/05/2022   K 3.8 06/05/2022   CL 100 06/05/2022   CO2 25 06/05/2022    RADIOGRAPHIC STUDIES: I have personally reviewed the radiological reports and agreed with the findings in the report.  ASSESSMENT AND PLAN:  Acute pulmonary embolism without acute cor pulmonale (HCC) CT chest 05/25/2022: Acute segmental and subsegmental pulmonary emboli in the left lower lobe and right upper lobe without any heart strain. Hospitalization 05/25/2022-05/28/2022 Current anticoagulation: Xarelto  Risk factors:  Tobacco abuse: Discussed at length about making realistic goals for quitting smoking. Obesity Recommendation: Anticoagulation duration will be based upon risk factors and the results of the blood work. Hypercoagulability workup will be performed.  2-week clinic follow-up to discuss results.  All questions were answered. The patient knows to call the clinic with any problems, questions or concerns.    Harriette Ohara, MD 06/20/22

## 2022-06-20 NOTE — Assessment & Plan Note (Signed)
CT chest 05/25/2022: Acute segmental and subsegmental pulmonary emboli in the left lower lobe and right upper lobe without any heart strain. Hospitalization 05/25/2022-05/28/2022 Current anticoagulation: Xarelto  Risk factors: Tobacco abuse Recommendation: Anticoagulation for 6 months. Hypercoagulability workup will be performed.  2-week telephone visit follow-up to discuss results.

## 2022-06-23 LAB — PROTEIN C, TOTAL: Protein C, Total: 88 % (ref 60–150)

## 2022-06-24 LAB — FACTOR 8 ASSAY: Coagulation Factor VIII: 141 % — ABNORMAL HIGH (ref 56–140)

## 2022-06-24 LAB — DRVVT MIX: dRVVT Mix: 67.9 s — ABNORMAL HIGH (ref 0.0–40.4)

## 2022-06-24 LAB — PTT-LA MIX: PTT-LA Mix: 56.8 s — ABNORMAL HIGH (ref 0.0–40.5)

## 2022-06-24 LAB — ANTITHROMBIN III ANTIGEN: AT III AG PPP IMM-ACNC: 104 % (ref 72–124)

## 2022-06-24 LAB — HEXAGONAL PHASE PHOSPHOLIPID: Hexagonal Phase Phospholipid: 13 s — ABNORMAL HIGH (ref 0–11)

## 2022-06-24 LAB — LUPUS ANTICOAGULANT PANEL
DRVVT: 121.3 s — ABNORMAL HIGH (ref 0.0–47.0)
PTT Lupus Anticoagulant: 61.2 s — ABNORMAL HIGH (ref 0.0–43.5)

## 2022-06-24 LAB — DRVVT CONFIRM: dRVVT Confirm: 2 ratio — ABNORMAL HIGH (ref 0.8–1.2)

## 2022-06-24 LAB — PROTEIN S, TOTAL: Protein S Ag, Total: 127 % (ref 60–150)

## 2022-06-25 LAB — FACTOR 5 LEIDEN

## 2022-06-29 ENCOUNTER — Inpatient Hospital Stay (HOSPITAL_BASED_OUTPATIENT_CLINIC_OR_DEPARTMENT_OTHER): Payer: BC Managed Care – PPO | Admitting: Hematology and Oncology

## 2022-06-29 VITALS — BP 199/74 | HR 50 | Temp 97.9°F | Resp 18 | Ht 73.0 in | Wt 292.0 lb

## 2022-06-29 DIAGNOSIS — I82402 Acute embolism and thrombosis of unspecified deep veins of left lower extremity: Secondary | ICD-10-CM | POA: Diagnosis not present

## 2022-06-29 DIAGNOSIS — Z7901 Long term (current) use of anticoagulants: Secondary | ICD-10-CM | POA: Diagnosis not present

## 2022-06-29 DIAGNOSIS — I2699 Other pulmonary embolism without acute cor pulmonale: Secondary | ICD-10-CM

## 2022-06-29 DIAGNOSIS — F17210 Nicotine dependence, cigarettes, uncomplicated: Secondary | ICD-10-CM | POA: Diagnosis not present

## 2022-06-29 DIAGNOSIS — I2694 Multiple subsegmental pulmonary emboli without acute cor pulmonale: Secondary | ICD-10-CM | POA: Diagnosis not present

## 2022-06-29 DIAGNOSIS — E6609 Other obesity due to excess calories: Secondary | ICD-10-CM | POA: Diagnosis not present

## 2022-06-29 DIAGNOSIS — I10 Essential (primary) hypertension: Secondary | ICD-10-CM | POA: Diagnosis not present

## 2022-06-29 LAB — PROTHROMBIN GENE MUTATION

## 2022-06-29 NOTE — Assessment & Plan Note (Addendum)
CT chest 05/25/2022: Acute segmental and subsegmental pulmonary emboli in the left lower lobe and right upper lobe without any heart strain. Hospitalization 05/25/2022-05/28/2022 Current anticoagulation: Xarelto   Risk factors:  Tobacco abuse: Discussed at length about making realistic goals for quitting smoking. Obesity Hypercoagulability workup negative results: Factor V Leiden, protein C, protein S and Antithrombin III Hypercoagulability workup positive results: Lupus anticoagulant, elevated factor VIII Testosterone replacement therapy Uncontrolled hypertension  Patient has an appointment tomorrow to discuss with his primary care physician about controlling his blood pressure.  He says he stopped taking blood pressure medicines few days ago because he feels that the medicines are giving him leg swelling and pain.  I discussed with the patient extensively about antiphospholipid antibodies and there is a chance that this could be false positive and therefore we would like to repeat testing in 3 months.  Patient will hold off on anticoagulation for 2 days prior to the testing so as to avoid any false positivity.  39-month follow-up with labs done ahead of time and telephone visit

## 2022-06-29 NOTE — Progress Notes (Signed)
Patient Care Team: Patient, No Pcp Per as PCP - General (General Practice) Berniece Salines, DO as PCP - Cardiology (Cardiology)  DIAGNOSIS:  Encounter Diagnosis  Name Primary?   Acute pulmonary embolism without acute cor pulmonale, unspecified pulmonary embolism type (HCC) Yes      CHIEF COMPLIANT: DVT and PE   INTERVAL HISTORY: Christopher Green is a 52 y.o. male is here because of recent diagnosis of DVT and PE. Marland KitchenHe presents to the clinic today for a follow-up. He complains of feet swelling and not been able to do anything since last week. He states legs swells mostly at night.  His blood pressure is significantly worse and he tells me that he stopped taking his blood pressure medicine because they are causing him to have swelling of the feet.    ALLERGIES:  has No Known Allergies.  MEDICATIONS:  Current Outpatient Medications  Medication Sig Dispense Refill   atorvastatin (LIPITOR) 40 MG tablet Take 1 tablet (40 mg total) by mouth daily. 30 tablet 2   buprenorphine (BUTRANS) 15 MCG/HR Place 1 patch onto the skin once a week.     buPROPion (WELLBUTRIN SR) 150 MG 12 hr tablet Take 1 tablet (150 mg total) by mouth 2 (two) times daily. Start 150 mg daily then increase to twice daily after 3 days for smoking cessation. Can continue up to 12 weeks. 180 tablet 0   hydrALAZINE (APRESOLINE) 50 MG tablet Take 1 tablet (50 mg total) by mouth 3 (three) times daily. 270 tablet 3   metFORMIN (GLUCOPHAGE-XR) 500 MG 24 hr tablet Take 500 mg by mouth daily with breakfast.     oxyCODONE-acetaminophen (PERCOCET) 10-325 MG tablet Take 1 tablet by mouth every 6 (six) hours as needed for pain.     rivaroxaban (XARELTO) 20 MG TABS tablet Take 1 tablet (20 mg total) by mouth daily with supper. 90 tablet 0   spironolactone (ALDACTONE) 25 MG tablet Take 1 tablet (25 mg total) by mouth daily. 30 tablet 2   testosterone cypionate (DEPOTESTOSTERONE CYPIONATE) 200 MG/ML injection Inject 200 mg into the muscle every  14 (fourteen) days.     valsartan (DIOVAN) 320 MG tablet Take 1 tablet (320 mg total) by mouth daily. 30 tablet 2   No current facility-administered medications for this visit.    PHYSICAL EXAMINATION: ECOG PERFORMANCE STATUS: 1 - Symptomatic but completely ambulatory  Vitals:   06/29/22 0915  BP: (!) 199/74  Pulse: (!) 50  Resp: 18  Temp: 97.9 F (36.6 C)  SpO2: 98%   Filed Weights   06/29/22 0915  Weight: 292 lb (132.5 kg)      LABORATORY DATA:  I have reviewed the data as listed    Latest Ref Rng & Units 06/05/2022    9:16 AM 05/28/2022    4:35 AM 05/27/2022   10:52 AM  CMP  Glucose 70 - 99 mg/dL 128  121  121   BUN 6 - 24 mg/dL 17  20  19    Creatinine 0.76 - 1.27 mg/dL 0.81  0.85  0.87   Sodium 134 - 144 mmol/L 138  139  139   Potassium 3.5 - 5.2 mmol/L 3.8  3.8  3.7   Chloride 96 - 106 mmol/L 100  107  105   CO2 20 - 29 mmol/L 25  26  26    Calcium 8.7 - 10.2 mg/dL 9.2  8.6  8.7     Lab Results  Component Value Date   WBC 9.2 05/27/2022  HGB 15.1 05/27/2022   HCT 48.4 05/27/2022   MCV 98.4 05/27/2022   PLT 206 05/27/2022    ASSESSMENT & PLAN:  Acute pulmonary embolism without acute cor pulmonale (HCC) CT chest 05/25/2022: Acute segmental and subsegmental pulmonary emboli in the left lower lobe and right upper lobe without any heart strain. Hospitalization 05/25/2022-05/28/2022 Current anticoagulation: Xarelto   Risk factors:  Tobacco abuse: Discussed at length about making realistic goals for quitting smoking. Obesity Hypercoagulability workup negative results: Factor V Leiden, protein C, protein S and Antithrombin III Hypercoagulability workup positive results: Lupus anticoagulant, elevated factor VIII Testosterone replacement therapy Uncontrolled hypertension  Patient has an appointment tomorrow to discuss with his primary care physician about controlling his blood pressure.  He says he stopped taking blood pressure medicines few days ago  because he feels that the medicines are giving him leg swelling and pain.  I discussed with the patient extensively about antiphospholipid antibodies and there is a chance that this could be false positive and therefore we would like to repeat testing in 3 months.  Patient will hold off on anticoagulation for 2 days prior to the testing so as to avoid any false positivity.  84-month follow-up with labs done ahead of time and telephone visit    Orders Placed This Encounter  Procedures   Lupus anticoagulant panel    Standing Status:   Future    Standing Expiration Date:   06/30/2023   The patient has a good understanding of the overall plan. he agrees with it. he will call with any problems that may develop before the next visit here. Total time spent: 30 mins including face to face time and time spent for planning, charting and co-ordination of care   Harriette Ohara, MD 06/29/22    I Gardiner Coins am acting as a Education administrator for [employer]  I have reviewed the above documentation for accuracy and completeness, and I agree with the above.

## 2022-06-30 DIAGNOSIS — R030 Elevated blood-pressure reading, without diagnosis of hypertension: Secondary | ICD-10-CM | POA: Diagnosis not present

## 2022-06-30 DIAGNOSIS — Z79899 Other long term (current) drug therapy: Secondary | ICD-10-CM | POA: Diagnosis not present

## 2022-06-30 DIAGNOSIS — E1165 Type 2 diabetes mellitus with hyperglycemia: Secondary | ICD-10-CM | POA: Diagnosis not present

## 2022-06-30 DIAGNOSIS — M79672 Pain in left foot: Secondary | ICD-10-CM | POA: Diagnosis not present

## 2022-06-30 DIAGNOSIS — M542 Cervicalgia: Secondary | ICD-10-CM | POA: Diagnosis not present

## 2022-06-30 DIAGNOSIS — M79671 Pain in right foot: Secondary | ICD-10-CM | POA: Diagnosis not present

## 2022-06-30 DIAGNOSIS — G8929 Other chronic pain: Secondary | ICD-10-CM | POA: Diagnosis not present

## 2022-06-30 DIAGNOSIS — Z6838 Body mass index (BMI) 38.0-38.9, adult: Secondary | ICD-10-CM | POA: Diagnosis not present

## 2022-07-02 ENCOUNTER — Encounter: Payer: Self-pay | Admitting: Internal Medicine

## 2022-07-02 ENCOUNTER — Ambulatory Visit: Payer: BC Managed Care – PPO | Attending: Internal Medicine | Admitting: Internal Medicine

## 2022-07-02 VITALS — BP 168/76 | HR 60 | Ht 73.0 in | Wt 292.8 lb

## 2022-07-02 DIAGNOSIS — Z79899 Other long term (current) drug therapy: Secondary | ICD-10-CM | POA: Diagnosis not present

## 2022-07-02 DIAGNOSIS — I441 Atrioventricular block, second degree: Secondary | ICD-10-CM

## 2022-07-02 DIAGNOSIS — I10 Essential (primary) hypertension: Secondary | ICD-10-CM

## 2022-07-02 DIAGNOSIS — E1165 Type 2 diabetes mellitus with hyperglycemia: Secondary | ICD-10-CM | POA: Diagnosis not present

## 2022-07-02 DIAGNOSIS — E538 Deficiency of other specified B group vitamins: Secondary | ICD-10-CM | POA: Diagnosis not present

## 2022-07-02 DIAGNOSIS — E1140 Type 2 diabetes mellitus with diabetic neuropathy, unspecified: Secondary | ICD-10-CM | POA: Diagnosis not present

## 2022-07-02 DIAGNOSIS — R030 Elevated blood-pressure reading, without diagnosis of hypertension: Secondary | ICD-10-CM | POA: Diagnosis not present

## 2022-07-02 DIAGNOSIS — Z6838 Body mass index (BMI) 38.0-38.9, adult: Secondary | ICD-10-CM | POA: Diagnosis not present

## 2022-07-02 NOTE — Progress Notes (Signed)
        HPI Christopher Green is referred for evaluation of symptomatic heart block. He is an obese 51 yo man with ah /o DVT and PE who was admitted with sob and found to also have high grade heart block. The patient has not had syncope but denies dietary indiscretion. His sob has improved.  No Known Allergies           Current Outpatient Medications  Medication Sig Dispense Refill   atorvastatin (LIPITOR) 40 MG tablet Take 1 tablet (40 mg total) by mouth daily. 30 tablet 2   buprenorphine (BUTRANS) 15 MCG/HR Place 1 patch onto the skin once a week.       buPROPion (WELLBUTRIN SR) 150 MG 12 hr tablet Take 1 tablet (150 mg total) by mouth 2 (two) times daily. Start 150 mg daily then increase to twice daily after 3 days for smoking cessation. Can continue up to 12 weeks. 180 tablet 0   hydrALAZINE (APRESOLINE) 50 MG tablet Take 1 tablet (50 mg total) by mouth 3 (three) times daily. 270 tablet 3   metFORMIN (GLUCOPHAGE-XR) 500 MG 24 hr tablet Take 500 mg by mouth daily with breakfast.       oxyCODONE-acetaminophen (PERCOCET) 10-325 MG tablet Take 1 tablet by mouth every 6 (six) hours as needed for pain.       rivaroxaban (XARELTO) 20 MG TABS tablet Take 1 tablet (20 mg total) by mouth daily with supper. 90 tablet 0   spironolactone (ALDACTONE) 25 MG tablet Take 1 tablet (25 mg total) by mouth daily. 30 tablet 2   testosterone cypionate (DEPOTESTOSTERONE CYPIONATE) 200 MG/ML injection Inject 200 mg into the muscle every 14 (fourteen) days.       valsartan (DIOVAN) 320 MG tablet Take 1 tablet (320 mg total) by mouth daily. 30 tablet 2    No current facility-administered medications for this visit.            Past Medical History:  Diagnosis Date   Hypertension        ROS:    All systems reviewed and negative except as noted in the HPI.          Past Surgical History:  Procedure Laterality Date   CARDIAC CATHETERIZATION   2019    clean cors             Family History  Problem  Relation Age of Onset   Heart attack Father          Social History         Socioeconomic History   Marital status: Married      Spouse name: Not on file   Number of children: Not on file   Years of education: Not on file   Highest education level: Not on file  Occupational History   Not on file  Tobacco Use   Smoking status: Every Day      Types: Cigarettes   Smokeless tobacco: Never  Substance and Sexual Activity   Alcohol use: Yes      Comment: daily   Drug use: Never   Sexual activity: Not on file  Other Topics Concern   Not on file  Social History Narrative   Not on file    Social Determinants of Health        Financial Resource Strain: Not on file  Food Insecurity: No Food Insecurity (05/25/2022)    Hunger Vital Sign     Worried About Running Out of   Food in the Last Year: Never true     Ran Out of Food in the Last Year: Never true  Transportation Needs: No Transportation Needs (05/25/2022)    PRAPARE - Transportation     Lack of Transportation (Medical): No     Lack of Transportation (Non-Medical): No  Physical Activity: Not on file  Stress: Not on file  Social Connections: Not on file  Intimate Partner Violence: Not At Risk (05/25/2022)    Humiliation, Afraid, Rape, and Kick questionnaire     Fear of Current or Ex-Partner: No     Emotionally Abused: No     Physically Abused: No     Sexually Abused: No        BP (!) 168/76   Pulse 60   Ht 6' 1" (1.854 m)   Wt 292 lb 12.8 oz (132.8 kg)   SpO2 96%   BMI 38.63 kg/m    Physical Exam:   Well appearing NAD HEENT: Unremarkable Neck:  No JVD, no thyromegally Lymphatics:  No adenopathy Back:  No CVA tenderness Lungs:  Clear with no wheezes HEART:  IRegular rate rhythm, no murmurs, no rubs, no clicks Abd:  soft, positive bowel sounds, no organomegally, no rebound, no guarding Ext:  2 plus pulses, no edema, no cyanosis, no clubbing Skin:  No rashes no nodules Neuro:  CN II through XII intact, motor  grossly intact   EKG - nsr with mostly 2:1 AV block as well as AVWB.    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 I have discussed the treatment options with the patient and the indications/risks/benefits/goals/expectation of PPM insertion were reviewe with the patient and he will call us when he wishes to proceed. Obesity - he will be encouraged to lose wight.   Christopher Delage,MD 

## 2022-07-02 NOTE — Patient Instructions (Addendum)
Medication Instructions:  Your physician recommends that you continue on your current medications as directed. Please refer to the Current Medication list given to you today.  *If you need a refill on your cardiac medications before your next appointment, please call your pharmacy*  Lab Work: None ordered.  If you have labs (blood work) drawn today and your tests are completely normal, you will receive your results only by: MyChart Message (if you have MyChart) OR A paper copy in the mail If you have any lab test that is abnormal or we need to change your treatment, we will call you to review the results.  Testing/Procedures: None ordered.  Follow-Up:  Dr. Lewayne Bunting has ordered a 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   Below are dates Dr. Ladona Ridgel is in the procedure lab:  February: 12, 14, 19, 26 March: 1,4,11   Please call us with a Month and Day you select so we can schedule this procedure with Dr. Lewayne Bunting.    Pacemaker Implantation, Adult Pacemaker implantation is a procedure to place a pacemaker inside the chest. A pacemaker is a small computer that sends electrical signals to the heart and helps the heart beat normally. A pacemaker also stores information about heart rhythms. You may need pacemaker implantation if you have: A slow heartbeat (bradycardia). Loss of consciousness that happens repeatedly (syncope) or repeated episodes of dizziness or light-headedness because of an irregular heart rate. Shortness of breath (dyspnea) due to heart problems. The pacemaker usually attaches to your heart through a wire called a lead. One or two leads may be needed. There are different types of pacemakers: Transvenous pacemaker. This type is placed under the skin or muscle of your upper chest area. The lead goes through a vein in the chest area to reach the inside of the heart. Epicardial pacemaker. This type is placed under the skin or muscle of your  chest or abdomen. The lead goes through your chest to the outside of the heart. Tell a health care provider about: Any allergies you have. All medicines you are taking, including vitamins, herbs, eye drops, creams, and over-the-counter medicines. Any problems you or family members have had with anesthetic medicines. Any blood or bone disorders you have. Any surgeries you have had. Any medical conditions you have. Whether you are pregnant or may be pregnant. What are the risks? Generally, this is a safe procedure. However, problems may occur, including: Infection. Bleeding. Failure of the pacemaker or the lead. Collapse of a lung or bleeding into a lung. Blood clot inside a blood vessel with a lead. Damage to the heart. Infection inside the heart (endocarditis). Allergic reactions to medicines. What happens before the procedure? Staying hydrated Follow instructions from your health care provider about hydration, which may include: Up to 2 hours before the procedure - you may continue to drink clear liquids, such as water, clear fruit juice, black coffee, and plain tea.  Eating and drinking restrictions Follow instructions from your health care provider about eating and drinking, which may include: 8 hours before the procedure - stop eating heavy meals or foods, such as meat, fried foods, or fatty foods. 6 hours before the procedure - stop eating light meals or foods, such as toast or cereal. 6 hours before the procedure - stop drinking milk or drinks that contain milk. 2 hours before the procedure - stop drinking clear liquids. Medicines Ask your health care provider about: Changing or stopping your regular medicines. This  is especially important if you are taking diabetes medicines or blood thinners. Taking medicines such as aspirin and ibuprofen. These medicines can thin your blood. Do not take these medicines unless your health care provider tells you to take them. Taking  over-the-counter medicines, vitamins, herbs, and supplements. Tests You may have: A heart evaluation. This may include: An electrocardiogram (ECG). This involves placing patches on your skin to check your heart rhythm. A chest X-ray. An echocardiogram. This is a test that uses sound waves (ultrasound) to produce an image of the heart. A cardiac rhythm monitor. This is used to record your heart rhythm and any events for a longer period of time. Blood tests. Genetic testing. General instructions Do not use any products that contain nicotine or tobacco for at least 4 weeks before the procedure. These products include cigarettes, e-cigarettes, and chewing tobacco. If you need help quitting, ask your health care provider. Ask your health care provider: How your surgery site will be marked. What steps will be taken to help prevent infection. These steps may include: Removing hair at the surgery site. Washing skin with a germ-killing soap. Receiving antibiotic medicine. Plan to have someone take you home from the hospital or clinic. If you will be going home right after the procedure, plan to have someone with you for 24 hours. What happens during the procedure? An IV will be inserted into one of your veins. You will be given one or more of the following: A medicine to help you relax (sedative). A medicine to numb the area (local anesthetic). A medicine to make you fall asleep (general anesthetic). The next steps vary depending on the type of pacemaker you will be getting. If you are getting a transvenous pacemaker: An incision will be made in your upper chest. A pocket will be made for the pacemaker. It may be placed under the skin or between layers of muscle. The lead will be inserted into a blood vessel that goes to the heart. While X-rays are taken by an imaging machine (fluoroscopy), the lead will be advanced through the vein to the inside of your heart. The other end of the lead will  be tunneled under the skin and attached to the pacemaker. If you are getting an epicardial pacemaker: An incision will be made near your ribs or breastbone (sternum) for the lead. The lead will be attached to the outside of your heart. Another incision will be made in your chest or upper abdomen to create a pocket for the pacemaker. The free end of the lead will be tunneled under the skin and attached to the pacemaker. The transvenous or epicardial pacemaker will be tested. Imaging studies may be done to check the lead position. The incisions will be closed with stitches (sutures), adhesive strips, or skin glue. Bandages (dressings) will be placed over the incisions. The procedure may vary among health care providers and hospitals. What happens after the procedure? Your blood pressure, heart rate, breathing rate, and blood oxygen level will be monitored until you leave the hospital or clinic. You may be given antibiotics. You will be given pain medicine. An ECG and chest X-rays will be done. You may need to wear a continuous type of ECG (Holter monitor) to check your heart rhythm. Your health care provider will program the pacemaker. If you were given a sedative during the procedure, it can affect you for several hours. Do not drive or operate machinery until your health care provider says that it is safe.  You will be given a pacemaker identification card. This card lists the implant date, device model, and manufacturer of your pacemaker. Summary A pacemaker is a small computer that sends electrical signals to the heart and helps the heart beat normally. There are different types of pacemakers. A pacemaker may be placed under the skin or muscle of your chest or abdomen. Follow instructions from your health care provider about eating and drinking and about taking medicines before the procedure. This information is not intended to replace advice given to you by your health care provider. Make  sure you discuss any questions you have with your health care provider. Document Revised: 02/11/2021 Document Reviewed: 05/03/2019 Elsevier Patient Education  [place].

## 2022-07-03 ENCOUNTER — Ambulatory Visit: Payer: BC Managed Care – PPO | Admitting: Cardiology

## 2022-07-16 DIAGNOSIS — M792 Neuralgia and neuritis, unspecified: Secondary | ICD-10-CM | POA: Diagnosis not present

## 2022-07-16 DIAGNOSIS — I10 Essential (primary) hypertension: Secondary | ICD-10-CM | POA: Diagnosis not present

## 2022-07-16 DIAGNOSIS — Z6838 Body mass index (BMI) 38.0-38.9, adult: Secondary | ICD-10-CM | POA: Diagnosis not present

## 2022-07-16 DIAGNOSIS — E1165 Type 2 diabetes mellitus with hyperglycemia: Secondary | ICD-10-CM | POA: Diagnosis not present

## 2022-07-16 DIAGNOSIS — I4430 Unspecified atrioventricular block: Secondary | ICD-10-CM | POA: Diagnosis not present

## 2022-07-16 DIAGNOSIS — R030 Elevated blood-pressure reading, without diagnosis of hypertension: Secondary | ICD-10-CM | POA: Diagnosis not present

## 2022-07-16 DIAGNOSIS — B351 Tinea unguium: Secondary | ICD-10-CM | POA: Diagnosis not present

## 2022-07-17 ENCOUNTER — Other Ambulatory Visit: Payer: Self-pay

## 2022-07-17 ENCOUNTER — Telehealth: Payer: Self-pay | Admitting: Cardiology

## 2022-07-17 DIAGNOSIS — I441 Atrioventricular block, second degree: Secondary | ICD-10-CM

## 2022-07-17 NOTE — Telephone Encounter (Signed)
Patient called to respond to Dr. Lovena Le that he selects Feb 19 to get his pacemaker.

## 2022-07-17 NOTE — Telephone Encounter (Signed)
Pt is scheduled for 08/17/22 @ 1:30...  He has an appt @ [facility] on 07/29/22 with Dr. Harriet Masson  and will have labs done that day.

## 2022-07-29 ENCOUNTER — Ambulatory Visit: Payer: BC Managed Care – PPO | Attending: Cardiology | Admitting: Cardiology

## 2022-07-29 VITALS — BP 190/80 | HR 98 | Ht 73.0 in | Wt 298.6 lb

## 2022-07-29 DIAGNOSIS — I4510 Unspecified right bundle-branch block: Secondary | ICD-10-CM

## 2022-07-29 DIAGNOSIS — Z79899 Other long term (current) drug therapy: Secondary | ICD-10-CM | POA: Diagnosis not present

## 2022-07-29 DIAGNOSIS — I10 Essential (primary) hypertension: Secondary | ICD-10-CM

## 2022-07-29 DIAGNOSIS — I4430 Unspecified atrioventricular block: Secondary | ICD-10-CM

## 2022-07-29 LAB — COMPREHENSIVE METABOLIC PANEL
ALT: 28 IU/L (ref 0–44)
AST: 26 IU/L (ref 0–40)
Albumin/Globulin Ratio: 1.9 (ref 1.2–2.2)
Albumin: 4.6 g/dL (ref 3.8–4.9)
Alkaline Phosphatase: 66 IU/L (ref 44–121)
BUN/Creatinine Ratio: 20 (ref 9–20)
BUN: 20 mg/dL (ref 6–24)
Bilirubin Total: 0.5 mg/dL (ref 0.0–1.2)
CO2: 24 mmol/L (ref 20–29)
Calcium: 9.5 mg/dL (ref 8.7–10.2)
Chloride: 100 mmol/L (ref 96–106)
Creatinine, Ser: 1 mg/dL (ref 0.76–1.27)
Globulin, Total: 2.4 g/dL (ref 1.5–4.5)
Glucose: 117 mg/dL — ABNORMAL HIGH (ref 70–99)
Potassium: 4.3 mmol/L (ref 3.5–5.2)
Sodium: 141 mmol/L (ref 134–144)
Total Protein: 7 g/dL (ref 6.0–8.5)
eGFR: 90 mL/min/{1.73_m2} (ref >59)

## 2022-07-29 LAB — MAGNESIUM: Magnesium: 2 mg/dL (ref 1.6–2.3)

## 2022-07-29 MED ORDER — VALSARTAN-HYDROCHLOROTHIAZIDE 320-25 MG PO TABS: 1.0000 | ORAL_TABLET | Freq: Every day | ORAL | 3 refills | Status: DC

## 2022-07-29 NOTE — Patient Instructions (Addendum)
Medication Instructions:  Your physician has recommended you make the following change in your medication:  STOP: Valsartan START: Valsartan-Hydrochlorothiazide 320 mg -25 mg  Please take your blood pressure daily and send in a MyChart message. Please include heart rates.   HOW TO TAKE YOUR BLOOD PRESSURE: Rest 5 minutes before taking your blood pressure. Don't smoke or drink caffeinated beverages for at least 30 minutes before. Take your blood pressure before (not after) you eat. Sit comfortably with your back supported and both feet on the floor (don't cross your legs). Elevate your arm to heart level on a table or a desk. Use the proper sized cuff. It should fit smoothly and snugly around your bare upper arm. There should be enough room to slip a fingertip under the cuff. The bottom edge of the cuff should be 1 inch above the crease of the elbow. Ideally, take 3 measurements at one sitting and record the average.  *If you need a refill on your cardiac medications before your next appointment, please call your pharmacy*  Pharmacy visit in 4 weeks.   Lab Work: Your physician recommends that you have labs drawn today: CMET, Mag If you have labs (blood work) drawn today and your tests are completely normal, you will receive your results only by: MyChart Message (if you have MyChart) OR A paper copy in the mail If you have any lab test that is abnormal or we need to change your treatment, we will call you to review the results.   Testing/Procedures: None   Follow-Up: At [facility], you and your health needs are our priority.  As part of our continuing mission to provide you with exceptional heart care, we have created designated Provider Care Teams.  These Care Teams include your primary Cardiologist (physician) and Advanced Practice Providers (APPs -  Physician Assistants and Nurse Practitioners) who all work together to provide you with the care you need, when you need  it.   Your next appointment:   4 month(s)  Provider:   Berniece Salines, DO

## 2022-07-29 NOTE — Progress Notes (Signed)
Cardiology Office Note:    Date:  07/31/2022   ID:  Christopher Green, DOB [birth year], MRN [id number]  PCP:  Patient, No Pcp Per  Cardiologist:  Berniece Salines, DO  Electrophysiologist:  None   Referring MD: No ref. provider found   " I am ok"  History of Present Illness:    Christopher Green is a 52 y.o. 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   Here today for follow up. No complaints today - looking forward to upcoming procedure.   He recently followed with Dr. Lovena Le - he has 2:1 AV block in the setting of RBB and lAD and has agreed to pacemaker implantation.   Past Medical History:  Diagnosis Date   Hypertension     Past Surgical History:  Procedure Laterality Date   CARDIAC CATHETERIZATION  2019   clean cors    Current Medications: Current Meds  Medication Sig   atorvastatin (LIPITOR) 40 MG tablet Take 1 tablet (40 mg total) by mouth daily.   buprenorphine (BUTRANS) 15 MCG/HR Place 1 patch onto the skin once a week.   buPROPion (WELLBUTRIN SR) 150 MG 12 hr tablet Take 1 tablet (150 mg total) by mouth 2 (two) times daily. Start 150 mg daily then increase to twice daily after 3 days for smoking cessation. Can continue up to 12 weeks.   hydrALAZINE (APRESOLINE) 50 MG tablet Take 1 tablet (50 mg total) by mouth 3 (three) times daily.   metFORMIN (GLUCOPHAGE-XR) 500 MG 24 hr tablet Take 500 mg by mouth daily with breakfast.   oxyCODONE-acetaminophen (PERCOCET) 10-325 MG tablet Take 1 tablet by mouth every 6 (six) hours as needed for pain.   pregabalin (LYRICA) 75 MG capsule Take 75 mg by mouth 3 (three) times daily.   rivaroxaban (XARELTO) 20 MG TABS tablet Take 1 tablet (20 mg total) by mouth daily with supper.   spironolactone (ALDACTONE) 25 MG tablet Take 1 tablet (25 mg total) by mouth daily.   testosterone cypionate (DEPOTESTOSTERONE CYPIONATE) 200 MG/ML injection Inject 200 mg into the  muscle every 14 (fourteen) days.   valsartan-hydrochlorothiazide (DIOVAN-HCT) 320-25 MG tablet Take 1 tablet by mouth daily.   [DISCONTINUED] valsartan (DIOVAN) 320 MG tablet Take 1 tablet (320 mg total) by mouth daily.     Allergies:   Patient has no known allergies.   Social History   Socioeconomic History   Marital status: Married    Spouse name: Not on file   Number of children: Not on file   Years of education: Not on file   Highest education level: Not on file  Occupational History   Not on file  Tobacco Use   Smoking status: Every Day    Types: Cigarettes   Smokeless tobacco: Never  Substance and Sexual Activity   Alcohol use: Yes    Comment: daily   Drug use: Never   Sexual activity: Not on file  Other Topics Concern   Not on file  Social History Narrative   Not on file   Social Determinants of Health   Financial Resource Strain: Not on file  Food Insecurity: No Food Insecurity (05/25/2022)   Hunger Vital Sign    Worried About Running Out of Food in the Last Year: Never true    Ran Out of Food in the Last Year: Never true  Transportation Needs: No Transportation Needs (05/25/2022)   [place] -  Hydrologist (Medical): No    Lack of Transportation (Non-Medical): No  Physical Activity: Not on file  Stress: Not on file  Social Connections: Not on file     Family History: The patient's family history includes Heart attack in his father.  ROS:   Review of Systems  Constitution: Negative for decreased appetite, fever and weight gain.  HENT: Negative for congestion, ear discharge, hoarse voice and sore throat.   Eyes: Negative for discharge, redness, vision loss in right eye and visual halos.  Cardiovascular: Negative for chest pain, dyspnea on exertion, leg swelling, orthopnea and palpitations.  Respiratory: Negative for cough, hemoptysis, shortness of breath and snoring.   Endocrine: Negative for heat intolerance and polyphagia.   Hematologic/Lymphatic: Negative for bleeding problem. Does not bruise/bleed easily.  Skin: Negative for flushing, nail changes, rash and suspicious lesions.  Musculoskeletal: Negative for arthritis, joint pain, muscle cramps, myalgias, neck pain and stiffness.  Gastrointestinal: Negative for abdominal pain, bowel incontinence, diarrhea and excessive appetite.  Genitourinary: Negative for decreased libido, genital sores and incomplete emptying.  Neurological: Negative for brief paralysis, focal weakness, headaches and loss of balance.  Psychiatric/Behavioral: Negative for altered mental status, depression and suicidal ideas.  Allergic/Immunologic: Negative for HIV exposure and persistent infections.    EKGs/Labs/Other Studies Reviewed:    The following studies were reviewed today:   EKG:  The ekg ordered today demonstrates   Recent Labs: 05/25/2022: B Natriuretic Peptide 79.2 05/26/2022: TSH 1.065 05/27/2022: Hemoglobin 15.1; Platelets 206 07/29/2022: ALT 28; BUN 20; Creatinine, Ser 1.00; Magnesium 2.0; Potassium 4.3; Sodium 141  Recent Lipid Panel No results found for: "CHOL", "TRIG", "HDL", "CHOLHDL", "VLDL", "LDLCALC", "LDLDIRECT"  Physical Exam:    VS:  BP (!) 190/80   Pulse 98   Ht 6' 1"$  (1.854 m)   Wt 135.4 kg   BMI 39.40 kg/m     Wt Readings from Last 3 Encounters:  07/29/22 135.4 kg  07/02/22 132.8 kg  06/29/22 132.5 kg     GEN: Well nourished, well developed in no acute distress HEENT: Normal NECK: No JVD; No carotid bruits LYMPHATICS: No lymphadenopathy CARDIAC: S1S2 noted,RRR, no murmurs, rubs, gallops RESPIRATORY:  Clear to auscultation without rales, wheezing or rhonchi  ABDOMEN: Soft, non-tender, non-distended, +bowel sounds, no guarding. EXTREMITIES: No edema, No cyanosis, no clubbing MUSCULOSKELETAL:  No deformity  SKIN: Warm and dry NEUROLOGIC:  Alert and oriented x 3, non-focal PSYCHIATRIC:  Normal affect, good insight  ASSESSMENT:    1.  Medication management   2. Essential hypertension   3. AV heart block   4. RBBB    PLAN:     He is hypertensive in the office  will optimize his antihypertensive : valsartan-Hctz 320 mg-25 mg.   He is pending pacemaker implantation.  The patient is in agreement with the above plan. The patient left the office in stable condition.  The patient will follow up in   Medication Adjustments/Labs and Tests Ordered: Current medicines are reviewed at length with the patient today.  Concerns regarding medicines are outlined above.  Orders Placed This Encounter  Procedures   Comprehensive Metabolic Panel (CMET)   Magnesium   AMB Referral to Heartcare Pharm-D   Meds ordered this encounter  Medications   valsartan-hydrochlorothiazide (DIOVAN-HCT) 320-25 MG tablet    Sig: Take 1 tablet by mouth daily.    Dispense:  90 tablet    Refill:  3    Patient Instructions  Medication Instructions:  Your physician  has recommended you make the following change in your medication:  STOP: Valsartan START: Valsartan-Hydrochlorothiazide 320 mg -25 mg  Please take your blood pressure daily and send in a MyChart message. Please include heart rates.   HOW TO TAKE YOUR BLOOD PRESSURE: Rest 5 minutes before taking your blood pressure. Don't smoke or drink caffeinated beverages for at least 30 minutes before. Take your blood pressure before (not after) you eat. Sit comfortably with your back supported and both feet on the floor (don't cross your legs). Elevate your arm to heart level on a table or a desk. Use the proper sized cuff. It should fit smoothly and snugly around your bare upper arm. There should be enough room to slip a fingertip under the cuff. The bottom edge of the cuff should be 1 inch above the crease of the elbow. Ideally, take 3 measurements at one sitting and record the average.  *If you need a refill on your cardiac medications before your next appointment, please call your  pharmacy*  Pharmacy visit in 4 weeks.   Lab Work: Your physician recommends that you have labs drawn today: CMET, Mag If you have labs (blood work) drawn today and your tests are completely normal, you will receive your results only by: MyChart Message (if you have MyChart) OR A paper copy in the mail If you have any lab test that is abnormal or we need to change your treatment, we will call you to review the results.   Testing/Procedures: None   Follow-Up: At [facility], you and your health needs are our priority.  As part of our continuing mission to provide you with exceptional heart care, we have created designated Provider Care Teams.  These Care Teams include your primary Cardiologist (physician) and Advanced Practice Providers (APPs -  Physician Assistants and Nurse Practitioners) who all work together to provide you with the care you need, when you need it.   Your next appointment:   4 month(s)  Provider:   Berniece Salines, DO     Adopting a Healthy Lifestyle.  Know what a healthy weight is for you (roughly BMI <25) and aim to maintain this   Aim for 7+ servings of fruits and vegetables daily   65-80+ fluid ounces of water or unsweet tea for healthy kidneys   Limit to max 1 drink of alcohol per day; avoid smoking/tobacco   Limit animal fats in diet for cholesterol and heart health - choose grass fed whenever available   Avoid highly processed foods, and foods high in saturated/trans fats   Aim for low stress - take time to unwind and care for your mental health   Aim for 150 min of moderate intensity exercise weekly for heart health, and weights twice weekly for bone health   Aim for 7-9 hours of sleep daily   When it comes to diets, agreement about the perfect plan isnt easy to find, even among the experts. Experts at the [employer] developed an idea known as the Healthy Eating Plate. Just imagine a plate divided into logical,  healthy portions.   The emphasis is on diet quality:   Load up on vegetables and fruits - one-half of your plate: Aim for color and variety, and remember that potatoes dont count.   Go for whole grains - one-quarter of your plate: Whole wheat, barley, wheat berries, quinoa, oats, brown rice, and foods made with them. If you want pasta, go with whole wheat pasta.  Protein power - one-quarter of your plate: Fish, chicken, beans, and nuts are all healthy, versatile protein sources. Limit red meat.   The diet, however, does go beyond the plate, offering a few other suggestions.   Use healthy plant oils, such as olive, canola, soy, corn, sunflower and peanut. Check the labels, and avoid partially hydrogenated oil, which have unhealthy trans fats.   If youre thirsty, drink water. Coffee and tea are good in moderation, but skip sugary drinks and limit milk and dairy products to one or two daily servings.   The type of carbohydrate in the diet is more important than the amount. Some sources of carbohydrates, such as vegetables, fruits, whole grains, and beans-are healthier than others.   Finally, stay active  Signed, Berniece Salines, DO  07/31/2022 10:15 PM    [facility]

## 2022-07-31 DIAGNOSIS — G8929 Other chronic pain: Secondary | ICD-10-CM | POA: Diagnosis not present

## 2022-07-31 DIAGNOSIS — Z6838 Body mass index (BMI) 38.0-38.9, adult: Secondary | ICD-10-CM | POA: Diagnosis not present

## 2022-07-31 DIAGNOSIS — Z79899 Other long term (current) drug therapy: Secondary | ICD-10-CM | POA: Diagnosis not present

## 2022-07-31 DIAGNOSIS — F419 Anxiety disorder, unspecified: Secondary | ICD-10-CM | POA: Diagnosis not present

## 2022-07-31 DIAGNOSIS — M542 Cervicalgia: Secondary | ICD-10-CM | POA: Diagnosis not present

## 2022-07-31 DIAGNOSIS — E1165 Type 2 diabetes mellitus with hyperglycemia: Secondary | ICD-10-CM | POA: Diagnosis not present

## 2022-07-31 DIAGNOSIS — M79671 Pain in right foot: Secondary | ICD-10-CM | POA: Diagnosis not present

## 2022-07-31 DIAGNOSIS — M129 Arthropathy, unspecified: Secondary | ICD-10-CM | POA: Diagnosis not present

## 2022-07-31 DIAGNOSIS — M79672 Pain in left foot: Secondary | ICD-10-CM | POA: Diagnosis not present

## 2022-07-31 DIAGNOSIS — R030 Elevated blood-pressure reading, without diagnosis of hypertension: Secondary | ICD-10-CM | POA: Diagnosis not present

## 2022-08-06 DIAGNOSIS — Z79899 Other long term (current) drug therapy: Secondary | ICD-10-CM | POA: Diagnosis not present

## 2022-08-14 DIAGNOSIS — Z419 Encounter for procedure for purposes other than remedying health state, unspecified: Secondary | ICD-10-CM | POA: Diagnosis not present

## 2022-08-14 NOTE — Pre-Procedure Instructions (Signed)
Instructed patient on the following items: Arrival time is now @ 0730 for 0930 procedure. Nothing to eat or drink after midnight No meds AM of procedure Responsible person to drive you home and stay with you for 24 hrs Wash with special soap night before and morning of procedure If on anti-coagulant drug instructions Xarelto- last dose today

## 2022-08-17 ENCOUNTER — Encounter (HOSPITAL_COMMUNITY)
Admission: RE | Disposition: A | Discharge status: Home / Self Care | Payer: Self-pay | Source: Home / Self Care | Attending: Internal Medicine

## 2022-08-17 ENCOUNTER — Ambulatory Visit (HOSPITAL_COMMUNITY): Payer: BC Managed Care – PPO

## 2022-08-17 ENCOUNTER — Other Ambulatory Visit: Payer: Self-pay

## 2022-08-17 ENCOUNTER — Ambulatory Visit (HOSPITAL_COMMUNITY)
Admission: RE | Admit: 2022-08-17 | Discharge: 2022-08-17 | Disposition: A | Discharge status: Home / Self Care | Payer: BC Managed Care – PPO | Attending: Internal Medicine | Admitting: Internal Medicine

## 2022-08-17 DIAGNOSIS — R001 Bradycardia, unspecified: Secondary | ICD-10-CM | POA: Diagnosis not present

## 2022-08-17 DIAGNOSIS — Z86718 Personal history of other venous thrombosis and embolism: Secondary | ICD-10-CM | POA: Insufficient documentation

## 2022-08-17 DIAGNOSIS — I441 Atrioventricular block, second degree: Secondary | ICD-10-CM | POA: Diagnosis not present

## 2022-08-17 DIAGNOSIS — E669 Obesity, unspecified: Secondary | ICD-10-CM | POA: Diagnosis not present

## 2022-08-17 DIAGNOSIS — Z950 Presence of cardiac pacemaker: Secondary | ICD-10-CM | POA: Diagnosis not present

## 2022-08-17 DIAGNOSIS — Z6838 Body mass index (BMI) 38.0-38.9, adult: Secondary | ICD-10-CM | POA: Insufficient documentation

## 2022-08-17 HISTORY — PX: PACEMAKER IMPLANT: EP1218

## 2022-08-17 LAB — CBC WITH DIFFERENTIAL/PLATELET
Abs Immature Granulocytes: 0.06 10*3/uL (ref 0.00–0.07)
Basophils Absolute: 0 10*3/uL (ref 0.0–0.1)
Basophils Relative: 0 %
Eosinophils Absolute: 0.1 10*3/uL (ref 0.0–0.5)
Eosinophils Relative: 1 %
HCT: 42.4 % (ref 39.0–52.0)
Hemoglobin: 14.6 g/dL (ref 13.0–17.0)
Immature Granulocytes: 1 %
Lymphocytes Relative: 27 %
Lymphs Abs: 2.2 10*3/uL (ref 0.7–4.0)
MCH: 31.5 pg (ref 26.0–34.0)
MCHC: 34.4 g/dL (ref 30.0–36.0)
MCV: 91.6 fL (ref 80.0–100.0)
Monocytes Absolute: 0.9 10*3/uL (ref 0.1–1.0)
Monocytes Relative: 11 %
Neutro Abs: 4.9 10*3/uL (ref 1.7–7.7)
Neutrophils Relative %: 60 %
Platelets: 202 10*3/uL (ref 150–400)
RBC: 4.63 MIL/uL (ref 4.22–5.81)
RDW: 13.9 % (ref 11.5–15.5)
WBC: 8.1 10*3/uL (ref 4.0–10.5)
nRBC: 0 % (ref 0.0–0.2)

## 2022-08-17 LAB — GLUCOSE, CAPILLARY: Glucose-Capillary: 127 mg/dL — ABNORMAL HIGH (ref 70–99)

## 2022-08-17 SURGERY — PACEMAKER IMPLANT

## 2022-08-17 MED ORDER — LIDOCAINE HCL (PF) 1 % IJ SOLN
INTRAMUSCULAR | Status: DC | PRN
  Administered 2022-08-17: 55 mL

## 2022-08-17 MED ORDER — MIDAZOLAM HCL 5 MG/5ML IJ SOLN
INTRAMUSCULAR | Status: AC
  Filled 2022-08-17: qty 5

## 2022-08-17 MED ORDER — SODIUM CHLORIDE 0.9 % IV SOLN: INTRAVENOUS | Status: DC

## 2022-08-17 MED ORDER — SODIUM CHLORIDE 0.9 % IV SOLN
INTRAVENOUS | Status: AC
  Filled 2022-08-17: qty 2

## 2022-08-17 MED ORDER — FENTANYL CITRATE (PF) 100 MCG/2ML IJ SOLN
INTRAMUSCULAR | Status: DC | PRN
  Administered 2022-08-17: 25 ug via INTRAVENOUS
  Administered 2022-08-17: 12.5 ug via INTRAVENOUS

## 2022-08-17 MED ORDER — ONDANSETRON HCL 4 MG/2ML IJ SOLN: 4.0000 mg | Freq: Four times a day (QID) | INTRAMUSCULAR | Status: DC | PRN

## 2022-08-17 MED ORDER — LIDOCAINE HCL 1 % IJ SOLN
INTRAMUSCULAR | Status: AC
  Filled 2022-08-17: qty 60

## 2022-08-17 MED ORDER — FENTANYL CITRATE (PF) 100 MCG/2ML IJ SOLN
INTRAMUSCULAR | Status: AC
  Filled 2022-08-17: qty 2

## 2022-08-17 MED ORDER — CEFAZOLIN IN SODIUM CHLORIDE 3-0.9 GM/100ML-% IV SOLN
3.0000 g | INTRAVENOUS | Status: AC
  Administered 2022-08-17: 3 g via INTRAVENOUS
  Filled 2022-08-17: qty 100

## 2022-08-17 MED ORDER — CEFAZOLIN SODIUM-DEXTROSE 1-4 GM/50ML-% IV SOLN
1.0000 g | Freq: Once | INTRAVENOUS | Status: AC
  Administered 2022-08-17: 1 g via INTRAVENOUS
  Filled 2022-08-17 (×2): qty 50

## 2022-08-17 MED ORDER — POVIDONE-IODINE 10 % EX SWAB
2.0000 | Freq: Once | CUTANEOUS | Status: AC
  Administered 2022-08-17: 2 via TOPICAL

## 2022-08-17 MED ORDER — SODIUM CHLORIDE 0.9 % IV SOLN
80.0000 mg | INTRAVENOUS | Status: AC
  Administered 2022-08-17: 80 mg

## 2022-08-17 MED ORDER — MIDAZOLAM HCL 5 MG/5ML IJ SOLN
INTRAMUSCULAR | Status: DC | PRN
  Administered 2022-08-17: 2 mg via INTRAVENOUS
  Administered 2022-08-17: 1 mg via INTRAVENOUS

## 2022-08-17 MED ORDER — ACETAMINOPHEN 325 MG PO TABS: 325.0000 mg | ORAL_TABLET | ORAL | Status: DC | PRN

## 2022-08-17 MED ORDER — CHLORHEXIDINE GLUCONATE 4 % EX LIQD: 4.0000 | Freq: Once | CUTANEOUS | Status: DC

## 2022-08-17 MED ORDER — HEPARIN (PORCINE) IN NACL 1000-0.9 UT/500ML-% IV SOLN
INTRAVENOUS | Status: DC | PRN
  Administered 2022-08-17: 500 mL

## 2022-08-17 SURGICAL SUPPLY — 12 items
CABLE SURGICAL S-101-97-12 (CABLE) ×1 IMPLANT
CATH CPS LOCATOR 3D LG (CATHETERS) IMPLANT
HELIX LOCKING TOOL (MISCELLANEOUS) ×1
LEAD ULTIPACE 52 LPA1231/52 (Lead) IMPLANT
LEAD ULTIPACE 65 LPA1231/65 (Lead) IMPLANT
PACEMAKER ASSURITY DR-RF (Pacemaker) IMPLANT
PAD DEFIB RADIO PHYSIO CONN (PAD) ×1 IMPLANT
SHEATH 7FR PRELUDE SNAP 13 (SHEATH) IMPLANT
SHEATH 9FR PRELUDE SNAP 13 (SHEATH) IMPLANT
SLITTER AGILIS HISPRO (INSTRUMENTS) IMPLANT
TOOL HELIX LOCKING (MISCELLANEOUS) IMPLANT
TRAY PACEMAKER INSERTION (PACKS) ×1 IMPLANT

## 2022-08-17 NOTE — Progress Notes (Signed)
Client advised to stay on bedrest and refused and states will sit on side of bed to void

## 2022-08-17 NOTE — Progress Notes (Addendum)
Client took oxycodone from home and gabapentin from home after being told to not take home pain med

## 2022-08-17 NOTE — H&P (Signed)
HPI Christopher Green is referred for evaluation of symptomatic heart block. He is an obese 52 yo man with ah /o DVT and PE who was admitted with sob and found to also have high grade heart block. The patient has not had syncope but denies dietary indiscretion. His sob has improved.  No Known Allergies           Current Outpatient Medications  Medication Sig Dispense Refill   atorvastatin (LIPITOR) 40 MG tablet Take 1 tablet (40 mg total) by mouth daily. 30 tablet 2   buprenorphine (BUTRANS) 15 MCG/HR Place 1 patch onto the skin once a week.       buPROPion (WELLBUTRIN SR) 150 MG 12 hr tablet Take 1 tablet (150 mg total) by mouth 2 (two) times daily. Start 150 mg daily then increase to twice daily after 3 days for smoking cessation. Can continue up to 12 weeks. 180 tablet 0   hydrALAZINE (APRESOLINE) 50 MG tablet Take 1 tablet (50 mg total) by mouth 3 (three) times daily. 270 tablet 3   metFORMIN (GLUCOPHAGE-XR) 500 MG 24 hr tablet Take 500 mg by mouth daily with breakfast.       oxyCODONE-acetaminophen (PERCOCET) 10-325 MG tablet Take 1 tablet by mouth every 6 (six) hours as needed for pain.       rivaroxaban (XARELTO) 20 MG TABS tablet Take 1 tablet (20 mg total) by mouth daily with supper. 90 tablet 0   spironolactone (ALDACTONE) 25 MG tablet Take 1 tablet (25 mg total) by mouth daily. 30 tablet 2   testosterone cypionate (DEPOTESTOSTERONE CYPIONATE) 200 MG/ML injection Inject 200 mg into the muscle every 14 (fourteen) days.       valsartan (DIOVAN) 320 MG tablet Take 1 tablet (320 mg total) by mouth daily. 30 tablet 2    No current facility-administered medications for this visit.            Past Medical History:  Diagnosis Date   Hypertension        ROS:    All systems reviewed and negative except as noted in the HPI.          Past Surgical History:  Procedure Laterality Date   CARDIAC CATHETERIZATION   2019    clean cors             Family History  Problem  Relation Age of Onset   Heart attack Father          Social History         Socioeconomic History   Marital status: Married      Spouse name: Not on file   Number of children: Not on file   Years of education: Not on file   Highest education level: Not on file  Occupational History   Not on file  Tobacco Use   Smoking status: Every Day      Types: Cigarettes   Smokeless tobacco: Never  Substance and Sexual Activity   Alcohol use: Yes      Comment: daily   Drug use: Never   Sexual activity: Not on file  Other Topics Concern   Not on file  Social History Narrative   Not on file    Social Determinants of Health        Financial Resource Strain: Not on file  Food Insecurity: No Food Insecurity (05/25/2022)    Hunger Vital Sign     Worried About Running Out of  Food in the Last Year: Never true     [place] in the Last Year: Never true  Transportation Needs: No Transportation Needs (05/25/2022)    PRAPARE - Armed forces logistics/support/administrative officer (Medical): No     Lack of Transportation (Non-Medical): No  Physical Activity: Not on file  Stress: Not on file  Social Connections: Not on file  Intimate Partner Violence: Not At Risk (05/25/2022)    Humiliation, Afraid, Rape, and Kick questionnaire     Fear of Current or Ex-Partner: No     Emotionally Abused: No     Physically Abused: No     Sexually Abused: No        BP (!) 168/76   Pulse 60   Ht '6\' 1"'$  (1.854 m)   Wt 292 lb 12.8 oz (132.8 kg)   SpO2 96%   BMI 38.63 kg/m    Physical Exam:   Well appearing NAD HEENT: Unremarkable Neck:  No JVD, no thyromegally Lymphatics:  No adenopathy Back:  No CVA tenderness Lungs:  Clear with no wheezes HEART:  IRegular rate rhythm, no murmurs, no rubs, no clicks Abd:  soft, positive bowel sounds, no organomegally, no rebound, no guarding Ext:  2 plus pulses, no edema, no cyanosis, no clubbing Skin:  No rashes no nodules Neuro:  CN II through XII intact, motor  grossly intact   EKG - nsr with mostly 2:1 AV block as well as AVWB.    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 I have discussed the treatment options with the patient and the indications/risks/benefits/goals/expectation of PPM insertion were reviewe with the patient and he will call us when he wishes to proceed. Obesity - he will be encouraged to lose wight.   Christopher Overlie Athalee Esterline,MD

## 2022-08-17 NOTE — Discharge Instructions (Addendum)
After Your Pacemaker   You have a [employer] Pacemaker  ACTIVITY Do not lift your arm above shoulder height for 1 week after your procedure. After 7 days, you may progress as below.  You should remove your sling 24 hours after your procedure, unless otherwise instructed by your provider.     Monday August 24, 2022  Tuesday August 25, 2022 Wednesday August 26, 2022 Thursday August 27, 2022   Do not lift, push, pull, or carry anything over 10 pounds with the affected arm until 6 weeks (Monday September 28, 2022 ) after your procedure.   You may drive AFTER your wound check, unless you have been told otherwise by your provider.   Ask your healthcare provider when you can go back to work   INCISION/Dressing If you are on a blood thinner such as Coumadin, Xarelto, Eliquis, Plavix, or Pradaxa please confirm with your provider when this should be resumed.   If large square, outer bandage is left in place, this can be removed after 24 hours from your procedure. Do not remove steri-strips or glue as below.   Monitor your Pacemaker site for redness, swelling, and drainage. Call the device clinic at [phone] if you experience these symptoms or fever/chills.  If your incision is sealed with Steri-strips or staples, you may shower 7 days after your procedure or when told by your provider. Do not remove the steri-strips or let the shower hit directly on your site. You may wash around your site with soap and water.    If you were discharged in a sling, please do not wear this during the day more than 48 hours after your surgery unless otherwise instructed. This may increase the risk of stiffness and soreness in your shoulder.   Avoid lotions, ointments, or perfumes over your incision until it is well-healed.  You may use a hot tub or a pool AFTER your wound check appointment if the incision is completely closed.  Pacemaker Alerts:  Some alerts are vibratory and others beep. These are NOT emergencies.  Please call our office to let us know. If this occurs at night or on weekends, it can wait until the next business day. Send a remote transmission.  If your device is capable of reading fluid status (for heart failure), you will be offered monthly monitoring to review this with you.   DEVICE MANAGEMENT Remote monitoring is used to monitor your pacemaker from home. This monitoring is scheduled every 91 days by our office. It allows [place] to keep an eye on the functioning of your device to ensure it is working properly. You will routinely see your Electrophysiologist annually (more often if necessary).   You should receive your ID card for your new device in 4-8 weeks. Keep this card with you at all times once received. Consider wearing a medical alert bracelet or necklace.  Your Pacemaker may be MRI compatible. This will be discussed at your next office visit/wound check.  You should avoid contact with strong electric or magnetic fields.   Do not use amateur (ham) radio equipment or electric (arc) welding torches. MP3 player headphones with magnets should not be used. Some devices are safe to use if held at least 12 inches (30 cm) from your Pacemaker. These include power tools, lawn mowers, and speakers. If you are unsure if something is safe to use, ask your health care provider.  When using your cell phone, hold it to the ear that is on the opposite side from the  Pacemaker. Do not leave your cell phone in a pocket over the Pacemaker.  You may safely use electric blankets, heating pads, computers, and microwave ovens.  Call the office right away if: You have chest pain. You feel more short of breath than you have felt before. You feel more light-headed than you have felt before. Your incision starts to open up.  This information is not intended to replace advice given to you by your health care provider. Make sure you discuss any questions you have with your health care provider.

## 2022-08-17 NOTE — Progress Notes (Signed)
Dr Lovena Le in and ok to d/c home; client c/o soreness left chest

## 2022-08-18 ENCOUNTER — Encounter (HOSPITAL_COMMUNITY): Payer: Self-pay | Admitting: Internal Medicine

## 2022-08-20 ENCOUNTER — Telehealth: Payer: Self-pay

## 2022-08-20 NOTE — Telephone Encounter (Addendum)
Follow-up after same day discharge: Implant date: 08/17/2022 MD: Cristopher Peru, MD Device: SJ PPM   Wound check visit: 08/26/2022 at 4:00 pm 90 day MD follow-up: scheduled  Remote Transmission received:yes  Dressing/sling removed:  yes  Confirm [place] restart on:  restart xarelto on 3/9   Pt doing well.  Requested sooner follow up.  Rescheduled for 08/26/2022.

## 2022-08-26 ENCOUNTER — Ambulatory Visit: Payer: BC Managed Care – PPO | Attending: Cardiovascular Disease

## 2022-08-26 DIAGNOSIS — I441 Atrioventricular block, second degree: Secondary | ICD-10-CM | POA: Diagnosis not present

## 2022-08-26 LAB — CUP PACEART INCLINIC DEVICE CHECK
Battery Remaining Longevity: 80 mo
Battery Voltage: 3.01 V
Brady Statistic RA Percent Paced: 14 %
Brady Statistic RV Percent Paced: 65 %
Date Time Interrogation Session: 20240313162542
Implantable Lead Connection Status: 753985
Implantable Lead Connection Status: 753985
Implantable Lead Implant Date: 20240304
Implantable Lead Implant Date: 20240304
Implantable Lead Location: 753859
Implantable Lead Location: 753860
Implantable Pulse Generator Implant Date: 20240304
Lead Channel Impedance Value: 412.5 Ohm
Lead Channel Impedance Value: 512.5 Ohm
Lead Channel Pacing Threshold Amplitude: 0.75 V
Lead Channel Pacing Threshold Amplitude: 0.75 V
Lead Channel Pacing Threshold Pulse Width: 0.5 ms
Lead Channel Pacing Threshold Pulse Width: 0.5 ms
Lead Channel Sensing Intrinsic Amplitude: 0.9 mV
Lead Channel Sensing Intrinsic Amplitude: 12 mV
Lead Channel Setting Pacing Amplitude: 3.5 V
Lead Channel Setting Pacing Amplitude: 3.5 V
Lead Channel Setting Pacing Pulse Width: 0.5 ms
Lead Channel Setting Sensing Sensitivity: 2 mV
Pulse Gen Model: 2272
Pulse Gen Serial Number: 8153879

## 2022-08-26 NOTE — Patient Instructions (Signed)

## 2022-08-26 NOTE — Progress Notes (Signed)
Wound check appointment. Steri-strips removed. Wound without redness or edema. Incision edges approximated, wound well healed. Normal device function. Thresholds, sensing, and impedances consistent with implant measurements. Device programmed at 3.5V/auto capture programmed on for extra safety margin until 3 month visit. Histogram distribution appropriate for patient and level of activity. 85% afib burden.  Currently on Xarelto for a pulmonary embolism. Patient educated about wound care, arm mobility, lifting restrictions. ROV in 3 months with implanting physician. Atrial sensitivity turned to auto.

## 2022-08-27 ENCOUNTER — Ambulatory Visit: Payer: BC Managed Care – PPO

## 2022-08-30 DIAGNOSIS — M549 Dorsalgia, unspecified: Secondary | ICD-10-CM | POA: Diagnosis not present

## 2022-08-30 DIAGNOSIS — S29012A Strain of muscle and tendon of back wall of thorax, initial encounter: Secondary | ICD-10-CM | POA: Diagnosis not present

## 2022-09-01 ENCOUNTER — Ambulatory Visit: Payer: BC Managed Care – PPO

## 2022-09-01 DIAGNOSIS — Z6838 Body mass index (BMI) 38.0-38.9, adult: Secondary | ICD-10-CM | POA: Diagnosis not present

## 2022-09-01 DIAGNOSIS — R030 Elevated blood-pressure reading, without diagnosis of hypertension: Secondary | ICD-10-CM | POA: Diagnosis not present

## 2022-09-01 DIAGNOSIS — F419 Anxiety disorder, unspecified: Secondary | ICD-10-CM | POA: Diagnosis not present

## 2022-09-01 DIAGNOSIS — G8929 Other chronic pain: Secondary | ICD-10-CM | POA: Diagnosis not present

## 2022-09-01 DIAGNOSIS — M542 Cervicalgia: Secondary | ICD-10-CM | POA: Diagnosis not present

## 2022-09-01 DIAGNOSIS — Z79899 Other long term (current) drug therapy: Secondary | ICD-10-CM | POA: Diagnosis not present

## 2022-09-01 DIAGNOSIS — E1165 Type 2 diabetes mellitus with hyperglycemia: Secondary | ICD-10-CM | POA: Diagnosis not present

## 2022-09-01 DIAGNOSIS — Z131 Encounter for screening for diabetes mellitus: Secondary | ICD-10-CM | POA: Diagnosis not present

## 2022-09-03 DIAGNOSIS — Z79899 Other long term (current) drug therapy: Secondary | ICD-10-CM | POA: Diagnosis not present

## 2022-09-04 ENCOUNTER — Encounter: Payer: Self-pay | Admitting: Family Medicine

## 2022-09-04 ENCOUNTER — Ambulatory Visit (INDEPENDENT_AMBULATORY_CARE_PROVIDER_SITE_OTHER): Payer: BC Managed Care – PPO | Admitting: Family Medicine

## 2022-09-04 VITALS — BP 168/93 | HR 69 | Temp 97.8°F | Ht 73.0 in | Wt 292.0 lb

## 2022-09-04 DIAGNOSIS — Z129 Encounter for screening for malignant neoplasm, site unspecified: Secondary | ICD-10-CM | POA: Insufficient documentation

## 2022-09-04 DIAGNOSIS — I10 Essential (primary) hypertension: Secondary | ICD-10-CM

## 2022-09-04 DIAGNOSIS — Z7689 Persons encountering health services in other specified circumstances: Secondary | ICD-10-CM | POA: Insufficient documentation

## 2022-09-04 DIAGNOSIS — I872 Venous insufficiency (chronic) (peripheral): Secondary | ICD-10-CM

## 2022-09-04 DIAGNOSIS — I4892 Unspecified atrial flutter: Secondary | ICD-10-CM

## 2022-09-04 DIAGNOSIS — E669 Obesity, unspecified: Secondary | ICD-10-CM | POA: Diagnosis not present

## 2022-09-04 DIAGNOSIS — R7303 Prediabetes: Secondary | ICD-10-CM | POA: Diagnosis not present

## 2022-09-04 DIAGNOSIS — E119 Type 2 diabetes mellitus without complications: Secondary | ICD-10-CM | POA: Insufficient documentation

## 2022-09-04 NOTE — Assessment & Plan Note (Signed)
-  patient not interested in colonoscopy or other non invasive methods of detecting CRC - pt not interested in lung cancer screening at this time.

## 2022-09-04 NOTE — Assessment & Plan Note (Signed)
Will need to check with pt's pharmacy to see who is prescribing his medications.  Several different providers listed for his different blood pressure medications.  HTN meds, xarelto, statins, can all be prescribed through me. Advised pt he will need to continue getting his controlled substances through his pain mgmt clinic.

## 2022-09-04 NOTE — Progress Notes (Unsigned)
New Patient Office Visit  Subjective    Patient ID: Christopher Green, male    DOB: Aug 22, 1970  Age: 52 y.o. MRN: [id number]  CC:  Chief Complaint  Patient presents with   Establish Care    HPI Christopher Green presents to establish care  Patient was previously a patient of Dr. Rex Kras in [place].  After that he began going to [facility].  He also goes to [facility] for pain management.  Wants to continue going there for pain management but wants to get his primary care here.  Patient recently had a pacemaker placed.  This is due to second-degree AV block and atrial flutter.  He has a cardiologist, Dr. Lovena Le.  He also sees the hematologist/oncologist due to his recent history of DVT/PE.  He has another appointment with them in April.  Patient is a current smoker, trying to quit.  Not using nicotine replacement therapy just reducing the number of cigarettes he uses over time.  No interest in using any medications or NRT at this time.  For his weight, patient is trying to reduce his overall caloric intake.  No specific diet plan intact.  Not interested in talking about diet at this time.  Patient takes multiple medication for blood pressure but is intermittently noncompliant/forgets to take them.  He believes some of his medications for blood pressure are causing his foot swelling.  We discussed the medications he is using and how they can be sometimes used to decrease swelling.   Patient is aware of his recent A1c that showed a 6.4% value.  He is open to getting a repeat check today.  He is taking his metformin he states.  Patient lives with his wife.  Has 2 adult children.  Not currently working.  Waiting on disability paperwork.   Outpatient Encounter Medications as of 09/04/2022  Medication Sig   buprenorphine (BUTRANS) 15 MCG/HR Place 1 patch onto the skin once a week.   buPROPion (WELLBUTRIN SR) 150 MG 12 hr tablet Take 1 tablet (150 mg total) by mouth 2 (two) times daily.  Start 150 mg daily then increase to twice daily after 3 days for smoking cessation. Can continue up to 12 weeks.   diazepam (VALIUM) 5 MG tablet Take 5 mg by mouth daily as needed.   Docusate Calcium (STOOL SOFTENER PO) Take 1 tablet by mouth daily as needed.   hydrALAZINE (APRESOLINE) 50 MG tablet Take 1 tablet (50 mg total) by mouth 3 (three) times daily.   metFORMIN (GLUCOPHAGE) 500 MG tablet Take 500 mg by mouth daily.   Multiple Vitamins-Minerals (MULTIVITAMIN WITH MINERALS) tablet Take 1 tablet by mouth daily.   oxyCODONE-acetaminophen (PERCOCET) 10-325 MG tablet Take 1 tablet by mouth every 6 (six) hours as needed for pain.   rivaroxaban (XARELTO) 20 MG TABS tablet Take 1 tablet (20 mg total) by mouth daily with supper.   terbinafine (LAMISIL) 250 MG tablet Take 250 mg by mouth daily.   testosterone cypionate (DEPOTESTOSTERONE CYPIONATE) 200 MG/ML injection Inject 200 mg into the muscle every 14 (fourteen) days.   valsartan-hydrochlorothiazide (DIOVAN-HCT) 320-25 MG tablet Take 1 tablet by mouth daily.   atorvastatin (LIPITOR) 40 MG tablet Take 1 tablet (40 mg total) by mouth daily.   spironolactone (ALDACTONE) 25 MG tablet Take 1 tablet (25 mg total) by mouth daily.   No facility-administered encounter medications on file as of 09/04/2022.    Past Medical History:  Diagnosis Date   Hypertension  Past Surgical History:  Procedure Laterality Date   CARDIAC CATHETERIZATION  2019   clean cors   PACEMAKER IMPLANT N/A 08/17/2022   Procedure: PACEMAKER IMPLANT;  Surgeon: Evans Lance, MD;  Location: [facility];  Service: Cardiovascular;  Laterality: N/A;    Family History  Problem Relation Age of Onset   Heart attack Father     Social History   Socioeconomic History   Marital status: Married    Spouse name: Not on file   Number of children: Not on file   Years of education: Not on file   Highest education level: Not on file  Occupational History   Not on file   Tobacco Use   Smoking status: Every Day    Types: Cigarettes   Smokeless tobacco: Never  Substance and Sexual Activity   Alcohol use: Yes    Comment: daily   Drug use: Never   Sexual activity: Not on file  Other Topics Concern   Not on file  Social History Narrative   Not on file   Social Determinants of Health   Financial Resource Strain: Not on file  Food Insecurity: No Food Insecurity (05/25/2022)   Hunger Vital Sign    Worried About Running Out of Food in the Last Year: Never true    Ran Out of Food in the Last Year: Never true  Transportation Needs: No Transportation Needs (05/25/2022)   PRAPARE - Hydrologist (Medical): No    Lack of Transportation (Non-Medical): No  Physical Activity: Not on file  Stress: Not on file  Social Connections: Not on file  Intimate Partner Violence: Not At Risk (05/25/2022)   Humiliation, Afraid, Rape, and Kick questionnaire    Fear of Current or Ex-Partner: No    Emotionally Abused: No    Physically Abused: No    Sexually Abused: No    ROS      Objective    BP (!) 168/93   Pulse 69   Temp 97.8 F (36.6 C) (Oral)   Ht 6\' 1"  (1.854 m)   Wt 292 lb (132.5 kg)   SpO2 98%   BMI 38.52 kg/m   Physical Exam Constitutional:      General: He is not in acute distress.    Appearance: He is obese.  HENT:     Head: Normocephalic and atraumatic.     Nose: Nose normal. No congestion.     Mouth/Throat:     Mouth: Mucous membranes are moist.     Pharynx: Oropharynx is clear.     Comments: Poor dentition Eyes:     Conjunctiva/sclera: Conjunctivae normal.     Pupils: Pupils are equal, round, and reactive to light.  Cardiovascular:     Rate and Rhythm: Normal rate and regular rhythm.     Pulses: Normal pulses.     Heart sounds: No murmur heard. Pulmonary:     Effort: Pulmonary effort is normal. No respiratory distress.     Breath sounds: Normal breath sounds.  Abdominal:     General: Abdomen is  flat. Bowel sounds are normal.     Palpations: Abdomen is soft.  Musculoskeletal:        General: No swelling.     Cervical back: No rigidity.  Skin:    General: Skin is warm and dry.  Neurological:     Mental Status: He is alert.     {Labs (Optional):23779}    Assessment & Plan:   Problem  List Items Addressed This Visit       Cardiovascular and Mediastinum   Atrial flutter ([place])   Essential hypertension    The pt will f/u in the next few weeks.   Still poorly controlled, partly at least d/t noncompliance.   - can adjust medications at that time.  - discuss weight loss options as well.        Venous insufficiency     Other   Cancer screening    -patient not interested in colonoscopy or other non invasive methods of detecting CRC - pt not interested in lung cancer screening at this time.        Encounter to establish care    Will need to check with pt's pharmacy to see who is prescribing his medications.  Several different providers listed for his different blood pressure medications.  HTN meds, xarelto, statins, can all be prescribed through me. Advised pt he will need to continue getting his controlled substances through his pain mgmt clinic.        Obesity (BMI 30-39.9)   Prediabetes - Primary   Relevant Orders   POCT glycosylated hemoglobin (Hb A1C)    Return in about 3 weeks (around 09/25/2022), or next week or 3 weeks from now, for HTN.   Benay Pike, MD

## 2022-09-04 NOTE — Assessment & Plan Note (Signed)
The pt will f/u in the next few weeks.   Still poorly controlled, partly at least d/t noncompliance.   - can adjust medications at that time.  - discuss weight loss options as well.

## 2022-09-04 NOTE — Patient Instructions (Signed)
It was nice to meet you today,   I will get an A1c on you today.  I will follow up with you at next visit regarding these tests.    Have a great day,   Dr. Jeannine Kitten

## 2022-09-14 DIAGNOSIS — Z419 Encounter for procedure for purposes other than remedying health state, unspecified: Secondary | ICD-10-CM | POA: Diagnosis not present

## 2022-09-21 ENCOUNTER — Other Ambulatory Visit: Payer: Self-pay

## 2022-09-21 ENCOUNTER — Inpatient Hospital Stay: Payer: BC Managed Care – PPO | Attending: Hematology and Oncology

## 2022-09-21 DIAGNOSIS — D6851 Activated protein C resistance: Secondary | ICD-10-CM | POA: Insufficient documentation

## 2022-09-21 DIAGNOSIS — Z86711 Personal history of pulmonary embolism: Secondary | ICD-10-CM | POA: Insufficient documentation

## 2022-09-21 DIAGNOSIS — I2699 Other pulmonary embolism without acute cor pulmonale: Secondary | ICD-10-CM

## 2022-09-21 DIAGNOSIS — Z7901 Long term (current) use of anticoagulants: Secondary | ICD-10-CM | POA: Diagnosis not present

## 2022-09-23 LAB — DRVVT CONFIRM: dRVVT Confirm: 1.5 ratio — ABNORMAL HIGH (ref 0.8–1.2)

## 2022-09-23 LAB — LUPUS ANTICOAGULANT PANEL
DRVVT: 132.3 s — ABNORMAL HIGH (ref 0.0–47.0)
PTT Lupus Anticoagulant: 62.8 s — ABNORMAL HIGH (ref 0.0–43.5)

## 2022-09-23 LAB — HEXAGONAL PHASE PHOSPHOLIPID: Hexagonal Phase Phospholipid: 12 s — ABNORMAL HIGH (ref 0–11)

## 2022-09-23 LAB — DRVVT MIX: dRVVT Mix: 92.4 s — ABNORMAL HIGH (ref 0.0–40.4)

## 2022-09-23 LAB — PTT-LA MIX: PTT-LA Mix: 59.1 s — ABNORMAL HIGH (ref 0.0–40.5)

## 2022-09-23 NOTE — Progress Notes (Signed)
HEMATOLOGY-ONCOLOGY TELEPHONE VISIT PROGRESS NOTE  I connected with our patient on 09/24/22 at  8:15 AM EDT by telephone and verified that I am speaking with the correct person using two identifiers.  I discussed the limitations, risks, security and privacy concerns of performing an evaluation and management service by telephone and the availability of in person appointments.  I also discussed with the patient that there may be a patient responsible charge related to this service. The patient expressed understanding and agreed to proceed.   History of Present Illness: Christopher Green is a 52 y.o. male is here because of recent diagnosis of DVT and PE. Marland KitchenHe presents to the clinic today for a telephone follow-up. Was diagnosed with A.Fib.  REVIEW OF SYSTEMS:   Constitutional: Denies fevers, chills or abnormal weight loss All other systems were reviewed with the patient and are negative.  Observations/Objective:     Assessment Plan:  Acute pulmonary embolism without acute cor pulmonale (HCC) CT chest 05/25/2022: Acute segmental and subsegmental pulmonary emboli in the left lower lobe and right upper lobe without any heart strain. Hospitalization 05/25/2022-05/28/2022 Current anticoagulation: Xarelto   Risk factors:  Tobacco abuse: Discussed at length about making realistic goals for quitting smoking. 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  I sent a years worth of refills for Xarelto which  he will need also for his newly diagnosed atrial fibrillation.  He would like to get his medications from his primary care physician from the future.  Therefore we will see him on an as-needed basis.   I discussed the assessment and treatment plan with the patient. The patient was provided an opportunity to ask questions and all were answered. The patient agreed with the plan and demonstrated an understanding of the instructions. The patient was advised to call back or seek an in-person evaluation if the symptoms worsen or if the condition fails to improve as anticipated.   I provided 12 minutes of non-face-to-face time during this encounter.  This includes time for charting and coordination of care   Tamsen Meek, MD  I Janan Ridge am acting as a scribe for Dr.Vinay Gudena  I have reviewed the above documentation for accuracy and completeness, and I agree with the above.

## 2022-09-24 ENCOUNTER — Inpatient Hospital Stay (HOSPITAL_BASED_OUTPATIENT_CLINIC_OR_DEPARTMENT_OTHER): Payer: BC Managed Care – PPO | Admitting: Hematology and Oncology

## 2022-09-24 DIAGNOSIS — I2699 Other pulmonary embolism without acute cor pulmonale: Secondary | ICD-10-CM

## 2022-09-24 MED ORDER — RIVAROXABAN 20 MG PO TABS: 20.0000 mg | ORAL_TABLET | Freq: Every day | ORAL | 3 refills | Status: DC

## 2022-09-24 NOTE — Assessment & Plan Note (Signed)
CT chest 05/25/2022: Acute segmental and subsegmental pulmonary emboli in the left lower lobe and right upper lobe without any heart strain. Hospitalization 05/25/2022-05/28/2022 Current anticoagulation: Xarelto   Risk factors:  Tobacco abuse: Discussed at length about making realistic goals for quitting smoking. 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

## 2022-09-28 ENCOUNTER — Encounter: Payer: Self-pay | Admitting: Pharmacist Clinician (PhC)/ Clinical Pharmacy Specialist

## 2022-09-28 ENCOUNTER — Ambulatory Visit
Payer: BC Managed Care – PPO | Attending: Internal Medicine | Admitting: Pharmacist Clinician (PhC)/ Clinical Pharmacy Specialist

## 2022-09-28 VITALS — BP 162/82 | HR 67 | Ht 73.0 in | Wt 287.0 lb

## 2022-09-28 DIAGNOSIS — I10 Essential (primary) hypertension: Secondary | ICD-10-CM | POA: Diagnosis not present

## 2022-09-28 MED ORDER — ATORVASTATIN CALCIUM 40 MG PO TABS: 40.0000 mg | ORAL_TABLET | Freq: Every day | ORAL | 3 refills | Status: DC

## 2022-09-28 MED ORDER — HYDRALAZINE HCL 100 MG PO TABS: 100.0000 mg | ORAL_TABLET | Freq: Two times a day (BID) | ORAL | 3 refills | Status: DC

## 2022-09-28 MED ORDER — SPIRONOLACTONE 25 MG PO TABS: 25.0000 mg | ORAL_TABLET | Freq: Every day | ORAL | 3 refills | Status: DC

## 2022-09-28 NOTE — Patient Instructions (Signed)
Follow up appointment: with Dr. Servando Salina on June 26  Take your BP meds as follows:   Change hydralazine to 100 mg twice daily (take 2 of the 50 mg tabs twice daily until gone, then start the 100 mg tablets)  Take spironolactone at night  Take valsartan-hctz in the morning.      Check your blood pressure at home daily (if able) and keep record of the readings.  Hypertension "High blood pressure"  Hypertension is often called "The Silent Killer." It rarely causes symptoms until it is extremely  high or has done damage to other organs in the body. For this reason, you should have your  blood pressure checked regularly by your physician. We will check your blood pressure  every time you see a provider at one of our offices.   Your blood pressure reading consists of two numbers. Ideally, blood pressure should be  below 120/80. The first ("top") number is called the systolic pressure. It measures the  pressure in your arteries as your heart beats. The second ("bottom") number is called the diastolic pressure. It measures the pressure in your arteries as the heart relaxes between beats.  The benefits of getting your blood pressure under control are enormous. A 10-point  reduction in systolic blood pressure can reduce your risk of stroke by 27% and heart failure by 28%  Your blood pressure goal is 130/80  To check your pressure at home you will need to:  1. Sit up in a chair, with feet flat on the floor and back supported. Do not cross your ankles or legs. 2. Rest your left arm so that the cuff is about heart level. If the cuff goes on your upper arm,  then just relax the arm on the table, arm of the chair or your lap. If you have a wrist cuff, we  suggest relaxing your wrist against your chest (think of it as Pledging the Flag with the  wrong arm).  3. Place the cuff snugly around your arm, about 1 inch above the crook of your elbow. The  cords should be inside the groove of your elbow.   4. Sit quietly, with the cuff in place, for about 5 minutes. After that 5 minutes press the power  button to start a reading. 5. Do not talk or move while the reading is taking place.  6. Record your readings on a sheet of paper. Although most cuffs have a memory, it is often  easier to see a pattern developing when the numbers are all in front of you.  7. You can repeat the reading after 1-3 minutes if it is recommended  Make sure your bladder is empty and you have not had caffeine or tobacco within the last 30 min  Always bring your blood pressure log with you to your appointments. If you have not brought your monitor in to be double checked for accuracy, please bring it to your next appointment.  You can find a list of quality blood pressure cuffs at validatebp.org

## 2022-09-28 NOTE — Assessment & Plan Note (Addendum)
Assessment: BP is uncontrolled in office BP 162/82 mmHg;  above the goal (<130/80). Admits to missing mid-day hydralazine most days Tolerates current medications well without any side effects Denies SOB, palpitation, chest pain, headaches,or swelling Patient needs to work on lifestyle modifications - discussed tobacco use, weight, lack of exercise.   Concerned about seeing too many different providers, wants to keep healthcare simplified  Plan:  Switch hydralazine to 100 mg twice daily Move spironolactone to evenings for better medication balance Continue taking valsartan hctz in the mornings.   Patient to keep record of BP readings with heart rate and report to [place] at the next visit Patient to follow up with Dr. Servando Salina in 2 months  Labs ordered today:  none To avoid multiple providers changing meds, advised patient to speak with PCP next week - if PCP can manage BP issues we will defer to him, if he would prefer we monitor, we can do so.

## 2022-09-28 NOTE — Progress Notes (Signed)
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 male patient of Dr Servando Salina, in the office today for hypertension management.  He was seen by Dr. Servando Salina in February at which time his pressure was elevated at 190/80.  At that time she added hydrochlorothiazide to his valsartan.  Since then he states no complications from the medications.  He does have chronic back pain as well as bone spurs in his shoulders.  Today he states his pain level is at 8 and is scheduled for steroid injection later this week.  Compliant with morning and evening medications, but admits freely that he rarely remembers the mid-day hydralazine dose.   He is also concerned with seeing multiple providers, not sure who is filling what medications.  Is trying to simplify everything except pain management, to within [facility].  He will meet with his new PCP Lenor Coffin) next week for the second time.    Blood Pressure Goal:  130/80  Current Medications:  valsartan hctz 320/25 qd, hydralazine 50 mg tid, spironolactone 25 mg qd  Previously tried:  nkda  Family Hx:  father (MI died at 13), mother (AF, anemia, kidney removal - cancer); sister deceased (Cerebral palsy); children - daughter 38, son -80 both healthy (daughter pregnant with second baby- another girl)    Social Hx:      Tobacco: 1 ppd, not ready to quit  Alcohol: 1-2 beers most days, drinking less  Caffeine: no regular caffeine  Diet:   mostly home cooked (only rare local cafe); Venita Sheffield once weekly-   cooks from [employer]; protein is variety of all (beef, pork, chicken);  regular vegetables (fresh, frozen and canned - rinses canned veggies);   Exercise: no regular exercise  Home BP readings: only on occasion, if feels bad   Adherence Assessment  Do you ever forget to take your medication? Yes - mid day No  Do you ever skip doses due to side effects? Yes No  Do you have trouble affording your medicines? Yes No  Are you ever unable to pick up your medication due to transportation difficulties? Yes No  Do you ever stop taking your medications because you don't believe they are helping? Yes No   Adherence strategy: wife manages 7 day pill minders   Accessory Clinical Findings    Lab Results  Component Value Date   CREATININE 1.00 07/29/2022   BUN 20 07/29/2022   NA 141 07/29/2022   K 4.3 07/29/2022   CL 100 07/29/2022   CO2 24 07/29/2022   Lab Results  Component Value Date   ALT 28 07/29/2022   AST 26 07/29/2022   ALKPHOS 66 07/29/2022   BILITOT 0.5 07/29/2022   Lab Results  Component Value Date   HGBA1C 6.4 (H) 05/28/2022    Home Medications    Current Outpatient Medications  Medication Sig Dispense Refill   atorvastatin (LIPITOR) 40 MG tablet Take 1 tablet (40 mg total) by mouth  daily. 30 tablet 2   buprenorphine (BUTRANS) 15 MCG/HR Place 1 patch onto the skin once a week.     diazepam (VALIUM) 5 MG tablet Take 5 mg by mouth daily as needed.     Docusate Calcium (STOOL SOFTENER PO) Take 1 tablet by mouth daily as needed.     hydrALAZINE (APRESOLINE) 50 MG tablet Take 1 tablet (50 mg total) by mouth 3 (three) times daily. 270 tablet 3   metFORMIN (GLUCOPHAGE) 500 MG tablet Take 500 mg by mouth daily.     Multiple Vitamins-Minerals (MULTIVITAMIN WITH MINERALS) tablet Take 1 tablet by mouth daily.     oxyCODONE-acetaminophen (PERCOCET) 10-325 MG tablet Take 1 tablet by mouth every 6 (six) hours as needed for pain.     rivaroxaban (XARELTO)  20 MG TABS tablet Take 1 tablet (20 mg total) by mouth daily with supper. 90 tablet 3   spironolactone (ALDACTONE) 25 MG tablet Take 1 tablet (25 mg total) by mouth daily. 30 tablet 2   terbinafine (LAMISIL) 250 MG tablet Take 250 mg by mouth daily.     testosterone cypionate (DEPOTESTOSTERONE CYPIONATE) 200 MG/ML injection Inject 200 mg into the muscle every 14 (fourteen) days.     valsartan-hydrochlorothiazide (DIOVAN-HCT) 320-25 MG tablet Take 1 tablet by mouth daily. 90 tablet 3   No current facility-administered medications for this visit.     HYPERTENSION CONTROL Vitals:   09/28/22 1116 09/28/22 1120  BP: (!) 171/98 (!) 162/82    The patient's blood pressure is elevated above target today.  In order to address the patient's elevated BP: A current anti-hypertensive medication was adjusted today.; Blood pressure will be monitored at home to determine if medication changes need to be made.     Assessment & Plan    Essential hypertension Assessment: BP is uncontrolled in office BP 162/82 mmHg;  above the goal (<130/80). Admits to missing mid-day hydralazine most days Tolerates current medications well without any side effects Denies SOB, palpitation, chest pain, headaches,or swelling Patient needs to work on lifestyle modifications - discussed tobacco use, weight, lack of exercise.   Concerned about seeing too many different providers, wants to keep healthcare simplified  Plan:  Switch hydralazine to 100 mg twice daily Move spironolactone to evenings for better medication balance Continue taking valsartan hctz in the mornings.   Patient to keep record of BP readings with heart rate and report to [place] at the next visit Patient to follow up with Dr. Servando Salina in 2 months  Labs ordered today:  none To avoid multiple providers changing meds, advised patient to speak with PCP next week - if PCP can manage BP issues we will defer to him, if he would prefer we monitor, we can do so.     Phillips Hay PharmD CPP [facility]  [address] [place], [place] [address] [phone]

## 2022-10-01 DIAGNOSIS — F419 Anxiety disorder, unspecified: Secondary | ICD-10-CM | POA: Diagnosis not present

## 2022-10-01 DIAGNOSIS — M25512 Pain in left shoulder: Secondary | ICD-10-CM | POA: Diagnosis not present

## 2022-10-01 DIAGNOSIS — E1169 Type 2 diabetes mellitus with other specified complication: Secondary | ICD-10-CM | POA: Diagnosis not present

## 2022-10-01 DIAGNOSIS — Z79899 Other long term (current) drug therapy: Secondary | ICD-10-CM | POA: Diagnosis not present

## 2022-10-01 DIAGNOSIS — M542 Cervicalgia: Secondary | ICD-10-CM | POA: Diagnosis not present

## 2022-10-01 DIAGNOSIS — G8929 Other chronic pain: Secondary | ICD-10-CM | POA: Diagnosis not present

## 2022-10-01 DIAGNOSIS — R030 Elevated blood-pressure reading, without diagnosis of hypertension: Secondary | ICD-10-CM | POA: Diagnosis not present

## 2022-10-01 DIAGNOSIS — Z6838 Body mass index (BMI) 38.0-38.9, adult: Secondary | ICD-10-CM | POA: Diagnosis not present

## 2022-10-02 LAB — LAB REPORT - SCANNED
EGFR: 78
EGFR: 95

## 2022-10-05 ENCOUNTER — Encounter: Payer: Self-pay | Admitting: Family Medicine

## 2022-10-05 ENCOUNTER — Ambulatory Visit (INDEPENDENT_AMBULATORY_CARE_PROVIDER_SITE_OTHER): Payer: BC Managed Care – PPO | Admitting: Family Medicine

## 2022-10-05 VITALS — BP 142/86 | HR 71 | Temp 98.2°F | Ht 73.0 in | Wt 304.0 lb

## 2022-10-05 DIAGNOSIS — D6862 Lupus anticoagulant syndrome: Secondary | ICD-10-CM | POA: Diagnosis not present

## 2022-10-05 DIAGNOSIS — G629 Polyneuropathy, unspecified: Secondary | ICD-10-CM | POA: Insufficient documentation

## 2022-10-05 MED ORDER — DULOXETINE HCL 30 MG PO CPEP: 30.0000 mg | ORAL_CAPSULE | Freq: Every day | ORAL | 1 refills | Status: DC

## 2022-10-05 NOTE — Patient Instructions (Signed)
I have sent in a prescription for duloxetine.    Please take this once a day for the next month.  I will follow up with you at that time to discuss changes to this dose.    We can discuss your labs at that time as well.   Have a great day,   Dr. Constance Goltz

## 2022-10-05 NOTE — Assessment & Plan Note (Signed)
Follow-up B12 and folate - Medical records request from pain clinic for previous lab workup - Starting duloxetine

## 2022-10-05 NOTE — Progress Notes (Signed)
   Established Patient Office Visit  Subjective   Patient ID: KYIAN OBST, male    DOB: 1971/03/25  Age: 52 y.o. MRN: [id number]  Chief Complaint  Patient presents with   Hypertension    Hypertension-recent saw his cardiologist office.  Made some medication changes including changing hydralazine dose to 100 mg twice daily, changing the timing of his blood pressure medication so that he is taking the spironolactone at night and valsartan/hctz  in the morning. Patient would like his medications to be sent in through me in the future to minimize confusion.    Afib/dvt - has lupus anticoagulant syndrome. Needs lifetime anticoagulation-had a near supply sent in at recent visit.  Neuropathy - tried gabapentin but didn't like it.  Tried pregabalin once but didn't feel like it helped. Then switched doctors. Willing to try duloxetine.    Obesity - discussed referral to nutritionist with patient.  He is working on his diet on his own.  Does not want referral to nutritionist.       ROS    Objective:     BP (!) 142/86   Pulse 71   Temp 98.2 F (36.8 C) (Oral)   Ht  (1.854 m)   Wt (!) 304 lb (137.9 kg)   SpO2 98%   BMI 40.11 kg/m    Physical Exam Gen: alert, oriented Gi: large pannus Psych: pleasant affect   No results found for any visits on 10/05/22.    The ASCVD Risk score (Arnett DK, et al., 2019) failed to calculate for the following reasons:   Cannot find a previous HDL lab   Cannot find a previous total cholesterol lab    Assessment & Plan:   Problem List Items Addressed This Visit       Nervous and Auditory   Peripheral polyneuropathy - Primary    Follow-up B12 and folate - Medical records request from pain clinic for previous lab workup - Starting duloxetine      Relevant Medications   DULoxetine (CYMBALTA) 30 MG capsule   Other Relevant Orders   B12 and Folate Panel   HgB A1c     Hematopoietic and Hemostatic   Lupus anticoagulant disorder     Return in about 4 weeks (around 11/02/2022) for HTN, neuropathy.    Sandre Kitty, MD

## 2022-10-06 DIAGNOSIS — Z79899 Other long term (current) drug therapy: Secondary | ICD-10-CM | POA: Diagnosis not present

## 2022-10-06 LAB — HEMOGLOBIN A1C
Est. average glucose Bld gHb Est-mCnc: 128 mg/dL
Hgb A1c MFr Bld: 6.1 % — ABNORMAL HIGH (ref 4.8–5.6)

## 2022-10-06 LAB — B12 AND FOLATE PANEL
Folate: 3.7 ng/mL (ref >3.0)
Vitamin B-12: 529 pg/mL (ref 232–1245)

## 2022-10-07 ENCOUNTER — Other Ambulatory Visit: Payer: Self-pay | Admitting: Family Medicine

## 2022-10-07 MED ORDER — CARVEDILOL 3.125 MG PO TABS: 3.1250 mg | ORAL_TABLET | Freq: Two times a day (BID) | ORAL | 3 refills | Status: DC

## 2022-10-07 NOTE — Progress Notes (Signed)
Discussed w/ cardiology and pt okay to start carvedilol now that pacemaker is in place.  Discussed with pt over the phone.  Sending in carvedilol prescription.

## 2022-10-14 DIAGNOSIS — Z419 Encounter for procedure for purposes other than remedying health state, unspecified: Secondary | ICD-10-CM | POA: Diagnosis not present

## 2022-11-03 ENCOUNTER — Ambulatory Visit (INDEPENDENT_AMBULATORY_CARE_PROVIDER_SITE_OTHER): Payer: BC Managed Care – PPO | Admitting: Family Medicine

## 2022-11-03 ENCOUNTER — Encounter: Payer: Self-pay | Admitting: Family Medicine

## 2022-11-03 VITALS — BP 135/64 | HR 70 | Ht 73.0 in | Wt 298.4 lb

## 2022-11-03 DIAGNOSIS — G629 Polyneuropathy, unspecified: Secondary | ICD-10-CM

## 2022-11-03 DIAGNOSIS — I4892 Unspecified atrial flutter: Secondary | ICD-10-CM

## 2022-11-03 DIAGNOSIS — G894 Chronic pain syndrome: Secondary | ICD-10-CM | POA: Diagnosis not present

## 2022-11-03 NOTE — Patient Instructions (Signed)
It was nice to see you,   I will request the records from [facility].  At your next visit we can discuss these pain medications further. If I can, I will take over prescribing your pain medication but we will need to decrease the amount you take to a safer amount.  We would also need to stop your diazapam before I take over prescribing the oxycodone.    Continue to take your carvedilol and duloxetine as well as your other medications.    Have a great day   Dr. Constance Goltz

## 2022-11-03 NOTE — Progress Notes (Signed)
   Established Patient Office Visit  Subjective   Patient ID: Christopher Green, male    DOB: 06/10/1971  Age: 52 y.o. MRN: [id number]  Chief Complaint  Patient presents with   Medical Management of Chronic Issues    HPI  Carvedilol -patient has been taking his carvedilol.  Has been compliant with the medication.  No questions or complaints.  Neuropathy-patient states the duloxetine is helping.  He is "able to walk now".  Pain is not completely gone but much improved.  Patient wants to know if I would be willing to prescribe his pain medications that he gets from [place].  States that it is far away and it cost him $200 every time he visits.  We looked over his controlled substances list and I told him he would need to not be on the diazepam and we would have to talk about decreasing his doses of oxycodone as he currently takes 10 mg 6 times a day and gets 180 tablets of 10 mg/month.  Advised him we would discuss it further at next visit.  Patient states he takes mainly his diazepam to help him sleep.      ROS    Objective:     BP 135/64   Pulse 70   Ht 6\' 1"  (1.854 m)   Wt 298 lb 6.4 oz (135.4 kg)   SpO2 96%   BMI 39.37 kg/m    Physical Exam General: Alert, oriented CV: Regular rate and rhythm Pulmonary no respiratory distress    No results found for any visits on 11/03/22.    The ASCVD Risk score (Arnett DK, et al., 2019) failed to calculate for the following reasons:   Cannot find a previous HDL lab   Cannot find a previous total cholesterol lab    Assessment & Plan:   Problem List Items Addressed This Visit       Cardiovascular and Mediastinum   Atrial flutter (HCC) - Primary    Restarted carvedilol after last visit after discussion with his cardiologist.  Pt tolerating medication well.  Continue current dose.         Nervous and Auditory   Peripheral polyneuropathy    Better with duloxetine. Continue current dose.         Other   Chronic pain  syndrome    Sees [facility] mgmt.  Is on high amounts of opioids and also prescribed benzodiazepines.  Pt would prefer this to be managed here due to costs.  Advised him if we were to take over management we would need to reduce his doses of opioids and eliminate his benzodiazepine use, which he uses for sleep mainly.  Advised pt to schedule future appt to discuss this.  Will reach out to [facility] for records request.        Return in about 4 weeks (around 12/01/2022) for pain mgmt.    Sandre Kitty, MD

## 2022-11-11 DIAGNOSIS — G894 Chronic pain syndrome: Secondary | ICD-10-CM | POA: Insufficient documentation

## 2022-11-11 NOTE — Assessment & Plan Note (Signed)
Sees [facility] for chronic pain mgmt.  Is on high amounts of opioids and also prescribed benzodiazepines.  Pt would prefer this to be managed here due to costs.  Advised him if we were to take over management we would need to reduce his doses of opioids and eliminate his benzodiazepine use, which he uses for sleep mainly.  Advised pt to schedule future appt to discuss this.  Will reach out to [facility] for records request.

## 2022-11-11 NOTE — Assessment & Plan Note (Signed)
Restarted carvedilol after last visit after discussion with his cardiologist.  Pt tolerating medication well.  Continue current dose.

## 2022-11-11 NOTE — Assessment & Plan Note (Signed)
Better with duloxetine. Continue current dose.

## 2022-11-14 DIAGNOSIS — Z419 Encounter for procedure for purposes other than remedying health state, unspecified: Secondary | ICD-10-CM | POA: Diagnosis not present

## 2022-11-16 ENCOUNTER — Ambulatory Visit (INDEPENDENT_AMBULATORY_CARE_PROVIDER_SITE_OTHER): Payer: BC Managed Care – PPO

## 2022-11-16 DIAGNOSIS — I441 Atrioventricular block, second degree: Secondary | ICD-10-CM

## 2022-11-18 LAB — CUP PACEART REMOTE DEVICE CHECK
Battery Remaining Longevity: 68 mo
Battery Remaining Percentage: 95.5 %
Battery Voltage: 2.98 V
Brady Statistic AP VP Percent: 40 %
Brady Statistic AP VS Percent: 22 %
Brady Statistic AS VP Percent: 19 %
Brady Statistic AS VS Percent: 2.8 %
Brady Statistic RA Percent Paced: 12 %
Brady Statistic RV Percent Paced: 71 %
Date Time Interrogation Session: 20240604040015
Implantable Lead Connection Status: 753985
Implantable Lead Connection Status: 753985
Implantable Lead Implant Date: 20240304
Implantable Lead Implant Date: 20240304
Implantable Lead Location: 753859
Implantable Lead Location: 753860
Implantable Pulse Generator Implant Date: 20240304
Lead Channel Impedance Value: 450 Ohm
Lead Channel Impedance Value: 480 Ohm
Lead Channel Pacing Threshold Amplitude: 0.75 V
Lead Channel Pacing Threshold Pulse Width: 0.5 ms
Lead Channel Sensing Intrinsic Amplitude: 0.3 mV
Lead Channel Sensing Intrinsic Amplitude: 12 mV
Lead Channel Setting Pacing Amplitude: 3.5 V
Lead Channel Setting Pacing Amplitude: 3.5 V
Lead Channel Setting Pacing Pulse Width: 0.5 ms
Lead Channel Setting Sensing Sensitivity: 2 mV
Pulse Gen Model: 2272
Pulse Gen Serial Number: 8153879

## 2022-11-24 ENCOUNTER — Encounter: Payer: Self-pay | Admitting: Internal Medicine

## 2022-11-24 ENCOUNTER — Ambulatory Visit: Payer: BC Managed Care – PPO | Attending: Internal Medicine | Admitting: Internal Medicine

## 2022-11-24 VITALS — BP 150/84 | HR 70 | Ht 73.0 in | Wt 308.0 lb

## 2022-11-24 DIAGNOSIS — Z950 Presence of cardiac pacemaker: Secondary | ICD-10-CM

## 2022-11-24 DIAGNOSIS — I441 Atrioventricular block, second degree: Secondary | ICD-10-CM | POA: Diagnosis not present

## 2022-11-24 DIAGNOSIS — I10 Essential (primary) hypertension: Secondary | ICD-10-CM | POA: Diagnosis not present

## 2022-11-24 LAB — CUP PACEART INCLINIC DEVICE CHECK
Battery Remaining Longevity: 102 mo
Battery Voltage: 2.98 V
Brady Statistic RA Percent Paced: 12 %
Brady Statistic RV Percent Paced: 70 %
Date Time Interrogation Session: 20240611164825
Implantable Lead Connection Status: 753985
Implantable Lead Connection Status: 753985
Implantable Lead Implant Date: 20240304
Implantable Lead Implant Date: 20240304
Implantable Lead Location: 753859
Implantable Lead Location: 753860
Implantable Pulse Generator Implant Date: 20240304
Lead Channel Impedance Value: 437.5 Ohm
Lead Channel Impedance Value: 450 Ohm
Lead Channel Pacing Threshold Amplitude: 1 V
Lead Channel Pacing Threshold Amplitude: 1 V
Lead Channel Pacing Threshold Pulse Width: 0.5 ms
Lead Channel Pacing Threshold Pulse Width: 0.5 ms
Lead Channel Sensing Intrinsic Amplitude: 0.3 mV
Lead Channel Sensing Intrinsic Amplitude: 12 mV
Lead Channel Setting Pacing Amplitude: 2.5 V
Lead Channel Setting Pacing Amplitude: 3.5 V
Lead Channel Setting Pacing Pulse Width: 0.5 ms
Lead Channel Setting Sensing Sensitivity: 2 mV
Pulse Gen Model: 2272
Pulse Gen Serial Number: 8153879

## 2022-11-24 NOTE — Progress Notes (Signed)
HPI Christopher Green returns for ongoing evaluation of symptomatic heart block, s/p PPM insertion. He is an obese 52 yo man with ah /o DVT and PE who was admitted with sob and found to also have high grade heart block. He is s/p PPM insertion. The patient has not had syncope but denies dietary indiscretion. His sob has improved.  No Known Allergies   Current Outpatient Medications  Medication Sig Dispense Refill   atorvastatin (LIPITOR) 40 MG tablet Take 1 tablet (40 mg total) by mouth daily. 90 tablet 3   buprenorphine (BUTRANS) 15 MCG/HR Place 1 patch onto the skin once a week.     carvedilol (COREG) 3.125 MG tablet Take 1 tablet (3.125 mg total) by mouth 2 (two) times daily with a meal. 60 tablet 3   diazepam (VALIUM) 5 MG tablet Take 5 mg by mouth daily as needed.     Docusate Calcium (STOOL SOFTENER PO) Take 1 tablet by mouth daily as needed.     DULoxetine (CYMBALTA) 30 MG capsule Take 1 capsule (30 mg total) by mouth daily. 30 capsule 1   hydrALAZINE (APRESOLINE) 100 MG tablet Take 1 tablet (100 mg total) by mouth 2 (two) times daily. 180 tablet 3   metFORMIN (GLUCOPHAGE) 500 MG tablet Take 500 mg by mouth daily.     Multiple Vitamins-Minerals (MULTIVITAMIN WITH MINERALS) tablet Take 1 tablet by mouth daily.     oxyCODONE-acetaminophen (PERCOCET) 10-325 MG tablet Take 1 tablet by mouth every 6 (six) hours as needed for pain.     rivaroxaban (XARELTO) 20 MG TABS tablet Take 1 tablet (20 mg total) by mouth daily with supper. 90 tablet 3   spironolactone (ALDACTONE) 25 MG tablet Take 1 tablet (25 mg total) by mouth daily. 90 tablet 3   terbinafine (LAMISIL) 250 MG tablet Take 250 mg by mouth daily.     testosterone cypionate (DEPOTESTOSTERONE CYPIONATE) 200 MG/ML injection Inject 200 mg into the muscle every 14 (fourteen) days.     valsartan-hydrochlorothiazide (DIOVAN-HCT) 320-25 MG tablet Take 1 tablet by mouth daily. 90 tablet 3   No current facility-administered medications for this  visit.     Past Medical History:  Diagnosis Date   Hypertension     ROS:   All systems reviewed and negative except as noted in the HPI.   Past Surgical History:  Procedure Laterality Date   CARDIAC CATHETERIZATION  2019   clean cors   PACEMAKER IMPLANT N/A 08/17/2022   Procedure: PACEMAKER IMPLANT;  Surgeon: Marinus Maw, MD;  Location: [facility];  Service: Cardiovascular;  Laterality: N/A;     Family History  Problem Relation Age of Onset   Heart attack Father      Social History   Socioeconomic History   Marital status: Married    Spouse name: Not on file   Number of children: Not on file   Years of education: Not on file   Highest education level: Not on file  Occupational History   Not on file  Tobacco Use   Smoking status: Every Day    Types: Cigarettes   Smokeless tobacco: Never  Substance and Sexual Activity   Alcohol use: Yes    Comment: daily   Drug use: Never   Sexual activity: Not on file  Other Topics Concern   Not on file  Social History Narrative   Not on file   Social Determinants of Health   Financial Resource Strain: Not on file  Food Insecurity: No Food Insecurity (05/25/2022)   Hunger Vital Sign    Worried About Running Out of Food in the Last Year: Never true    Ran Out of Food in the Last Year: Never true  Transportation Needs: No Transportation Needs (05/25/2022)   PRAPARE - Administrator, Civil Service (Medical): No    Lack of Transportation (Non-Medical): No  Physical Activity: Not on file  Stress: Not on file  Social Connections: Not on file  Intimate Partner Violence: Not At Risk (05/25/2022)   Humiliation, Afraid, Rape, and Kick questionnaire    Fear of Current or Ex-Partner: No    Emotionally Abused: No    Physically Abused: No    Sexually Abused: No     BP (!) 150/84   Ht 6\' 1"  (1.854 m)   Wt (!) 308 lb (139.7 kg)   BMI 40.64 kg/m   Physical Exam:  Morbidly obese appearing  NAD HEENT: Unremarkable Neck:  No JVD, no thyromegally Lymphatics:  No adenopathy Back:  No CVA tenderness Lungs:  Clear with no wheezes HEART:  Regular rate rhythm, no murmurs, no rubs, no clicks Abd:  soft, positive bowel sounds, no organomegally, no rebound, no guarding Ext:  2 plus pulses, no edema, no cyanosis, no clubbing Skin:  No rashes no nodules Neuro:  CN II through XII intact, motor grossly intact  EKG - atrial flutter with a controlled VR  DEVICE  Normal device function.  See PaceArt for details.   Assess/Plan:  Advanced heart block - he is s/p insertion of a DDD PPM.  Obesity - he will be encouraged to lose wight.  DM - he is on metformin and I encouraged him to seek out his primary MD to consider a drug that helps DM and weight loss like Ozempic. HTN -his bp is controlled. We will follow.    Sharlot Gowda [name],MD

## 2022-11-24 NOTE — Patient Instructions (Addendum)
Medication Instructions:  Your physician recommends that you continue on your current medications as directed. Please refer to the Current Medication list given to you today.  *If you need a refill on your cardiac medications before your next appointment, please call your pharmacy*  Lab Work: None ordered.  If you have labs (blood work) drawn today and your tests are completely normal, you will receive your results only by: MyChart Message (if you have MyChart) OR A paper copy in the mail If you have any lab test that is abnormal or we need to change your treatment, we will call you to review the results.  Testing/Procedures: None ordered.  Follow-Up: At [facility], you and your health needs are our priority.  As part of our continuing mission to provide you with exceptional heart care, we have created designated Provider Care Teams.  These Care Teams include your primary Cardiologist (physician) and Advanced Practice Providers (APPs -  Physician Assistants and Nurse Practitioners) who all work together to provide you with the care you need, when you need it.  Your next appointment:   Please schedule a 6 month follow up with Dr. [name]   The format for your next appointment:   In Person  Provider:   Lewayne Bunting, MD{or one of the following Advanced Practice Providers on your designated Care Team:   Francis Dowse, [place] Casimiro Needle "Mardelle Matte" Lanna Poche, [place]  Remote monitoring is used to monitor your Pacemaker from home. This monitoring reduces the number of office visits required to check your device to one time per year. It allows [place] to keep an eye on the functioning of your device to ensure it is working properly. You are scheduled for a device check from home on 9/3. You may send your transmission at any time that day. If you have a wireless device, the transmission will be sent automatically. After your physician reviews your transmission, you will receive a postcard with your next  transmission date.

## 2022-11-30 ENCOUNTER — Other Ambulatory Visit: Payer: Self-pay | Admitting: Internal Medicine

## 2022-12-01 ENCOUNTER — Ambulatory Visit (INDEPENDENT_AMBULATORY_CARE_PROVIDER_SITE_OTHER): Payer: BC Managed Care – PPO | Admitting: Family Medicine

## 2022-12-01 ENCOUNTER — Telehealth: Payer: Self-pay

## 2022-12-01 ENCOUNTER — Encounter: Payer: Self-pay | Admitting: Family Medicine

## 2022-12-01 VITALS — BP 157/80 | HR 71 | Ht 73.0 in | Wt 305.8 lb

## 2022-12-01 DIAGNOSIS — E291 Testicular hypofunction: Secondary | ICD-10-CM | POA: Diagnosis not present

## 2022-12-01 DIAGNOSIS — E1169 Type 2 diabetes mellitus with other specified complication: Secondary | ICD-10-CM

## 2022-12-01 DIAGNOSIS — G894 Chronic pain syndrome: Secondary | ICD-10-CM | POA: Diagnosis not present

## 2022-12-01 MED ORDER — TESTOSTERONE CYPIONATE 200 MG/ML IM SOLN: 200.0000 mg | INTRAMUSCULAR | 1 refills | Status: DC

## 2022-12-01 MED ORDER — OZEMPIC (0.25 OR 0.5 MG/DOSE) 2 MG/3ML ~~LOC~~ SOPN: PEN_INJECTOR | SUBCUTANEOUS | 1 refills | Status: AC

## 2022-12-01 MED ORDER — OXYCODONE-ACETAMINOPHEN 10-325 MG PO TABS: 1.0000 | ORAL_TABLET | Freq: Four times a day (QID) | ORAL | 0 refills | Status: DC | PRN

## 2022-12-01 MED ORDER — SILDENAFIL CITRATE 100 MG PO TABS: 50.0000 mg | ORAL_TABLET | Freq: Every day | ORAL | 11 refills | Status: AC | PRN

## 2022-12-01 NOTE — Telephone Encounter (Signed)
Pt is calling for an update on medication on the frequency or the quantity.

## 2022-12-01 NOTE — Progress Notes (Unsigned)
   Established Patient Office Visit  Subjective   Patient ID: Christopher Green, male    DOB: [birth year]  Age: 52 y.o. MRN: [id number]  Chief Complaint  Patient presents with   Medical Management of Chronic Issues    HPI  Diabetes - pt states he was told he has diabetes in the past.  He does not know what his previous A1c values were but states they were in the 'double digits' at one time.    Chronic pain - pt has not taken his pain medication in several days because he wanted to wait until this appointment before refilling.  He takes 10-20 mg of oxycodone usually 3 times a day.  Has talked to a neurosurgeon at "[facility]".  Has not taken any of his diazepam in several weeks.  We discussed decreasing his total monthly oxycodone to 150 tabs from 180.    Testosterone - pt takes testosterone replacement once a week every Friday.  We discussed getting routine lab work prior to refilling his prescription.     {History (Optional):23778}  ROS    Objective:     BP (!) 157/80   Pulse 71   Ht 6\' 1"  (1.854 m)   Wt (!) 305 lb 12.8 oz (138.7 kg)   SpO2 97%   BMI 40.35 kg/m  {Vitals History (Optional):23777}  Physical Exam Gen: alert, oriented Cv: rrr Pulm: lctab    {Labs (Optional):23779}  The ASCVD Risk score (Arnett DK, et al., 2019) failed to calculate for the following reasons:   Cannot find a previous HDL lab   Cannot find a previous total cholesterol lab    Assessment & Plan:   Problem List Items Addressed This Visit       Endocrine   Diabetes mellitus (HCC)    Unable to locate previous A1c values from [facility]. Pt states he has had A1c > 6.5 in the past.  Most recently A1c 6.4 with treatment.   Will start semaglutide weekly injection at 0.25mg  and increase to 0.5mg  after 4 weeks.        Relevant Medications   Semaglutide,0.25 or 0.5MG /DOS, (OZEMPIC, 0.25 OR 0.5 MG/DOSE,) 2 MG/3ML SOPN   Hypogonadism in male - Primary    Will get testosterone  level, estrogen and psa levels.  Will adjust testosterone dosing based on patient's levels.        Relevant Orders   Testosterone (Completed)   PSA (Completed)   Estrogens, Total (Completed)     Other   Chronic pain syndrome    Will take over patient's opioid medication management.  He has agreed to stop his diazepam.  He has agreed to decrease to 150 tablets monthly.         Return in about 2 months (around 01/31/2023) for DM, HTN.    Sandre Kitty, MD

## 2022-12-01 NOTE — Patient Instructions (Addendum)
It was nice to see you today,  We do the following things: - We started Ozempic.  Take this once weekly.  You will inject it into the fat of your abdomen at a starting dose of 0.25 mg.  Do this for 4 weeks and then increase it to 0.5 mg if you are tolerating that.   - I discontinued your Valium.  I started a prescription for 150 tablets of the oxycodone.  - I sent in prescription for sildenafil and testosterone.  I ordered some lab work regarding your testosterone levels.  I will let you know the results when I get them.  I would like to see you back in 2 months to recheck your A1c and discuss your other chronic medical issues  Have a great day,  Frederic Jericho, MD

## 2022-12-02 LAB — ESTROGENS, TOTAL

## 2022-12-02 NOTE — Telephone Encounter (Signed)
Pt called to inquire about this again and I spoke with provider and she said that he is going to contact pharmacy to get this fixed.  Routing to provider as an Financial planner.

## 2022-12-03 ENCOUNTER — Other Ambulatory Visit: Payer: Self-pay | Admitting: Family Medicine

## 2022-12-03 LAB — PSA: Prostate Specific Ag, Serum: 0.7 ng/mL (ref 0.0–4.0)

## 2022-12-03 LAB — TESTOSTERONE: Testosterone: >1500 ng/dL — ABNORMAL HIGH (ref 264–916)

## 2022-12-03 MED ORDER — OXYCODONE-ACETAMINOPHEN 10-325 MG PO TABS: ORAL_TABLET | ORAL | 0 refills | Status: DC

## 2022-12-03 NOTE — Progress Notes (Signed)
Sending in rx to [facility].

## 2022-12-04 DIAGNOSIS — E291 Testicular hypofunction: Secondary | ICD-10-CM | POA: Insufficient documentation

## 2022-12-04 NOTE — Assessment & Plan Note (Signed)
Will take over patient's opioid medication management.  He has agreed to stop his diazepam.  He has agreed to decrease to 150 tablets monthly.

## 2022-12-04 NOTE — Assessment & Plan Note (Signed)
Will get testosterone level, estrogen and psa levels.  Will adjust testosterone dosing based on patient's levels.

## 2022-12-04 NOTE — Assessment & Plan Note (Signed)
Unable to locate previous A1c values from [facility]. Pt states he has had A1c > 6.5 in the past.  Most recently A1c 6.4 with treatment.   Will start semaglutide weekly injection at 0.25mg  and increase to 0.5mg  after 4 weeks.

## 2022-12-08 ENCOUNTER — Encounter: Payer: Self-pay | Admitting: Family Medicine

## 2022-12-08 ENCOUNTER — Ambulatory Visit (INDEPENDENT_AMBULATORY_CARE_PROVIDER_SITE_OTHER): Payer: BC Managed Care – PPO | Admitting: Family Medicine

## 2022-12-08 ENCOUNTER — Ambulatory Visit
Admission: RE | Admit: 2022-12-08 | Discharge: 2022-12-08 | Disposition: A | Discharge status: Home / Self Care | Payer: BC Managed Care – PPO | Source: Ambulatory Visit | Attending: Family Medicine | Admitting: Family Medicine

## 2022-12-08 VITALS — BP 129/73 | HR 82 | Temp 97.4°F | Ht 73.0 in | Wt 302.0 lb

## 2022-12-08 DIAGNOSIS — R051 Acute cough: Secondary | ICD-10-CM

## 2022-12-08 DIAGNOSIS — S0990XA Unspecified injury of head, initial encounter: Secondary | ICD-10-CM

## 2022-12-08 DIAGNOSIS — R55 Syncope and collapse: Secondary | ICD-10-CM | POA: Insufficient documentation

## 2022-12-08 DIAGNOSIS — S0232XA Fracture of orbital floor, left side, initial encounter for closed fracture: Secondary | ICD-10-CM | POA: Diagnosis not present

## 2022-12-08 DIAGNOSIS — S02832A Fracture of medial orbital wall, left side, initial encounter for closed fracture: Secondary | ICD-10-CM | POA: Diagnosis not present

## 2022-12-08 DIAGNOSIS — R059 Cough, unspecified: Secondary | ICD-10-CM | POA: Diagnosis not present

## 2022-12-08 DIAGNOSIS — S0285XA Fracture of orbit, unspecified, initial encounter for closed fracture: Secondary | ICD-10-CM | POA: Diagnosis not present

## 2022-12-08 DIAGNOSIS — E291 Testicular hypofunction: Secondary | ICD-10-CM

## 2022-12-08 MED ORDER — DULOXETINE HCL 30 MG PO CPEP: 30.0000 mg | ORAL_CAPSULE | Freq: Every day | ORAL | 1 refills | Status: DC

## 2022-12-08 NOTE — Assessment & Plan Note (Signed)
Discussed with patient the prescribed dose.  Patient agreed to take the prescribed dose and not more than that, which she has been doing recently.  Gets his injections on Sunday.  Will need to follow-up in 2 weeks with repeat testosterone lab on Wednesday or Thursday (midway through his injections.)

## 2022-12-08 NOTE — Assessment & Plan Note (Addendum)
Due to cough.  Has happened in the past.  Cough appears to be bronchospasm perhaps from undiagnosed asthma.  Patient does have a history of allergies.  Would benefit from spirometry testing given his smoking status and recurrent episodes of coughing.  After patient has had his concerns regarding head injury and possible orbital fracture addressed we can discuss pulmonary function testing at future visit and trialing an inhaler if appropriate.  Will get chest x-ray.  Patient had CT angio a year ago which did not make mention of emphysema or other signs of COPD.

## 2022-12-08 NOTE — Patient Instructions (Addendum)
It was nice to see you today,  We addressed the following topics today: - I ordered a CT of your head to evaluate for head bleed and orbital fracture.   - I also ordered a chest x ray.   - I reordered your duloxetine.   - only use 1ml (200mg ) of your testosterone once per week.  Your testosterone levels were too high the last time we checked.    Please go to [facility] on [address] before 230pm to get your head imaging and chest x ray.  I will let you know when I get the results.    Have a great day,  Frederic Jericho, MD

## 2022-12-08 NOTE — Addendum Note (Signed)
Addended by: Sandre Kitty on: 12/08/2022 04:23 PM   Modules accepted: Orders

## 2022-12-08 NOTE — Progress Notes (Signed)
Remote pacemaker transmission.   

## 2022-12-08 NOTE — Progress Notes (Signed)
Acute Office Visit  Subjective:     Patient ID: Christopher Green, male    DOB: 04/14/1971, 52 y.o.   MRN: [id number]  Chief Complaint  Patient presents with   Follow-up    cough    HPI Patient is in today for cough.  Patient states that on Saturday he had an abrupt coughing episode that caused him to pass out.  He states he hit his head on a coffee table.  He then had a similar episode yesterday (Monday) in which he again had a coughing episode that caused him to pass out and he hit his face on the ground.  Currently has significant bruising around the left eye.  Denies double vision.  Feels like maybe the left is slightly more blurry but cannot be sure.  Denies headache.  Has had these episodes before, most recently about a year ago.  He takes allergy medication for seasonal allergies.  Denies any history of COPD or asthma.  No pain with movement of the eyes.  Patient states he blew his nose after the injury and his left eye "swelled up".  We discussed the patient's lab results from previous visit specifically his testosterone levels.  Patient states he was taking 2 mL of the medication twice a week.  We discussed that it was important to take it as prescribed which would be 1 mL once every 14 days.  Patient understands this.  Has not taken it since he saw the results.  He believes it was Sunday, 2 days ago, the last time he took the testosterone.  ROS      Objective:    BP 129/73   Pulse 82   Temp (!) 97.4 F (36.3 C) (Oral)   Ht 6\' 1"  (1.854 m)   Wt (!) 302 lb (137 kg)   SpO2 96%   BMI 39.84 kg/m    Physical Exam General: Alert, oriented HEENT: Significant bruising, swelling around the left eye both superior and inferior to the orbit.  Significantly tender to palpation.  No hyphema. PERRLA.  EOMI.  No bruising of the scalp or forehead. Pulmonary: Lungs clear bilaterally  No results found for any visits on 12/08/22.      Assessment & Plan:   Traumatic injury of head,  initial encounter Assessment & Plan: 2 falls with head injury in the past 4 days.  Patient on Xarelto.  Exam showed swelling around the left orbit.  There is concern for intracranial bleeding due to his multiple falls and use of anticoagulation.  There is also concern for orbital fracture due to the swelling and tenderness around his eye. - Advised patient to not take Xarelto until we are able to rule out bleeding - Stat head CT.  [facility].  Will follow-up with patient and if needed we will send to the ED   Orders: -     CT HEAD WO CONTRAST ( ); Future  Acute cough -     DG Chest 2 View; Future  Situational syncope Assessment & Plan: Due to cough.  Has happened in the past.  Cough appears to be bronchospasm perhaps from undiagnosed asthma.  Patient does have a history of allergies.  Would benefit from spirometry testing given his smoking status and recurrent episodes of coughing.  After patient has had his concerns regarding head injury and possible orbital fracture addressed we can discuss pulmonary function testing at future visit and trialing an inhaler if appropriate.  Will get chest x-ray.  Patient  had CT angio a year ago which did not make mention of emphysema or other signs of COPD.   Hypogonadism in male Assessment & Plan: Discussed with patient the prescribed dose.  Patient agreed to take the prescribed dose and not more than that, which she has been doing recently.  Gets his injections on Sunday.  Will need to follow-up in 2 weeks with repeat testosterone lab on Wednesday or Thursday (midway through his injections.)   Other orders -     DULoxetine HCl; Take 1 capsule (30 mg total) by mouth daily.  Dispense: 90 capsule; Refill: 1     Return in about 3 days (around 12/11/2022) for head injury.  Sandre Kitty, MD

## 2022-12-08 NOTE — Assessment & Plan Note (Signed)
2 falls with head injury in the past 4 days.  Patient on Xarelto.  Exam showed swelling around the left orbit.  There is concern for intracranial bleeding due to his multiple falls and use of anticoagulation.  There is also concern for orbital fracture due to the swelling and tenderness around his eye. - Advised patient to not take Xarelto until we are able to rule out bleeding - Stat head CT.  [facility].  Will follow-up with patient and if needed we will send to the ED

## 2022-12-09 ENCOUNTER — Ambulatory Visit: Payer: BC Managed Care – PPO | Attending: Cardiology | Admitting: Cardiology

## 2022-12-09 DIAGNOSIS — D3132 Benign neoplasm of left choroid: Secondary | ICD-10-CM | POA: Diagnosis not present

## 2022-12-09 NOTE — Telephone Encounter (Signed)
This was completed

## 2022-12-14 DIAGNOSIS — Z419 Encounter for procedure for purposes other than remedying health state, unspecified: Secondary | ICD-10-CM | POA: Diagnosis not present

## 2022-12-16 ENCOUNTER — Encounter: Payer: Self-pay | Admitting: Family Medicine

## 2022-12-16 ENCOUNTER — Ambulatory Visit (INDEPENDENT_AMBULATORY_CARE_PROVIDER_SITE_OTHER): Payer: BC Managed Care – PPO | Admitting: Family Medicine

## 2022-12-16 VITALS — BP 121/73 | HR 75 | Resp 18 | Ht 73.0 in | Wt 292.0 lb

## 2022-12-16 DIAGNOSIS — J019 Acute sinusitis, unspecified: Secondary | ICD-10-CM

## 2022-12-16 DIAGNOSIS — B9689 Other specified bacterial agents as the cause of diseases classified elsewhere: Secondary | ICD-10-CM | POA: Diagnosis not present

## 2022-12-16 DIAGNOSIS — S0285XD Fracture of orbit, unspecified, subsequent encounter for fracture with routine healing: Secondary | ICD-10-CM | POA: Diagnosis not present

## 2022-12-16 DIAGNOSIS — J3089 Other allergic rhinitis: Secondary | ICD-10-CM

## 2022-12-16 MED ORDER — CETIRIZINE HCL 10 MG PO TABS
10.0000 mg | ORAL_TABLET | Freq: Every day | ORAL | 11 refills | Status: DC
Start: 2022-12-16 — End: 2023-12-20

## 2022-12-16 MED ORDER — AMOXICILLIN-POT CLAVULANATE 875-125 MG PO TABS
1.0000 | ORAL_TABLET | Freq: Two times a day (BID) | ORAL | 0 refills | Status: AC
Start: 2022-12-16 — End: 2022-12-23

## 2022-12-16 NOTE — Progress Notes (Signed)
Established Patient Office Visit  Subjective   Patient ID: Christopher Green, male    DOB: [birth year]  Age: 52 y.o. MRN: [id number]  Chief Complaint  Patient presents with   Head Injury    HPI Christopher Green is a 52 y.o. male presenting today for follow up of head injury.  He initially had an appointment on 12/08/2022 with Dr. Constance Goltz to discuss 2 episodes in which he started coughing and then passed out, during which he did hit his head.  At the time, significant bruising around left eye but no double vision or headache.  At that time, there was concern for intracranial bleeding due to his use of anticoagulation on Xarelto as well as possible orbital blowout fracture.  Stat CT head revealed blowout fractures in medial wall and floor of left orbit.  Dr. Constance Goltz discussed with Dr. Fabian Sharp, the on-call ophthalmologist at [facility].  The patient was able to see Dr. Dione Booze on 12/08/2022, he states that Dr. Dione Booze told him that he would not need any follow-up. He does continue to have ongoing sinus pressure, nasal congestion, ear pain consistent with sinus infections that he has had in the past.  He gets sinus infections several times each year.  He has taken an allergy medicine in the past and is taking a generic version of one now, he is not sure what its name is.  ROS Negative unless otherwise noted in HPI   Objective:     BP 121/73 (BP Location: Left Arm, Patient Position: Sitting, Cuff Size: Large)   Pulse 75   Resp 18   Ht 6\' 1"  (1.854 m)   Wt 292 lb (132.5 kg)   SpO2 97%   BMI 38.52 kg/m   Physical Exam Constitutional:      General: He is not in acute distress.    Appearance: Normal appearance.  HENT:     Head: Normocephalic and atraumatic.     Right Ear: Tympanic membrane, ear canal and external ear normal.     Left Ear: Tympanic membrane, ear canal and external ear normal.     Nose: Congestion present.  Eyes:     Extraocular Movements: Extraocular movements intact.      Conjunctiva/sclera: Conjunctivae normal.     Pupils: Pupils are equal, round, and reactive to light.     Comments: Bruising around left eye  Cardiovascular:     Rate and Rhythm: Normal rate and regular rhythm.     Pulses: Normal pulses.     Heart sounds: Normal heart sounds. No murmur heard.    No friction rub. No gallop.  Pulmonary:     Effort: Pulmonary effort is normal. No respiratory distress.     Breath sounds: Wheezing (RLL) present. No rhonchi or rales.  Skin:    General: Skin is warm and dry.  Neurological:     Mental Status: He is alert and oriented to person, place, and time.  Psychiatric:        Mood and Affect: Mood normal.     Assessment & Plan:  Acute bacterial sinusitis -     Amoxicillin-Pot Clavulanate; Take 1 tablet by mouth 2 (two) times daily for 7 days.  Dispense: 14 tablet; Refill: 0  Environmental and seasonal allergies -     Cetirizine HCl; Take 1 tablet (10 mg total) by mouth daily.  Dispense: 30 tablet; Refill: 11  Closed fracture of left orbit with routine healing, subsequent encounter  Orbital fracture Requesting records from Dr.  [employer].  Eye is healing well, denies any changes in vision, photophobia.  Pain is currently manageable and improving.  Acute bacterial sinusitus/allergies Patient has not had symptoms of rhinosinusitis including sinus pressure, nasal congestion, ear pain, cough for more than 2 weeks now.  Starting 7-day course of Augmentin to treat current sinus infection.  We also discussed that given his history of situational syncope triggered by cough, it is important to control his allergies as well as address any potential pulmonary concerns to prevent future sinus infections or bronchospasm that will cause him to lose consciousness again.  Sending prescription for him to start taking cetirizine as daily allergy medication.  He is not interested in a referral to ENT at this time to address other potential causes of chronic sinusitis.  We  also discussed that in the future, it would be beneficial to have pulmonary function testing either at our office or pulmonology to assess for asthma or COPD.  In the future, he may even benefit from montelukast 10 mg daily for allergies and potential asthma.  Will discuss these options further with PCP at upcoming appointment on 02/01/2023.  Return as scheduled 02/01/2023 for follow up of HTN, DM.    Melida Quitter, PA

## 2022-12-16 NOTE — Patient Instructions (Addendum)
START cetirizine daily for allergies. Taking them daily will keep allergies under better control and hopefully prevent future sinus infections.  START Augmentin antibiotic for acute sinus infection.  At your next appointment, you can discuss what next steps will be with Dr. Constance Goltz.

## 2022-12-21 ENCOUNTER — Telehealth: Payer: Self-pay | Admitting: *Deleted

## 2022-12-21 NOTE — Telephone Encounter (Signed)
Pt informed of below.  

## 2022-12-21 NOTE — Telephone Encounter (Signed)
Please have him call the ophthalmologist that he saw, Dr. Dione Booze, to obtain clearance and the letter as this is not something that I feel comfortable doing in the primary care setting with the type of injury he had.

## 2022-12-21 NOTE — Telephone Encounter (Signed)
Pt came in office today looking to get a letter stating that he is able to go back to work, drive and operate heavy equipment. Please advise.

## 2022-12-28 ENCOUNTER — Telehealth: Payer: Self-pay | Admitting: *Deleted

## 2022-12-28 NOTE — Telephone Encounter (Signed)
Pt came in office today to inquire about his appointment, informed him that it is next month. He would like a refill on below.     Prescription Request  12/28/2022  LOV: 12/16/22 ROV: 02/01/23  What is the name of the medication or equipment? oxyCODONE-acetaminophen (PERCOCET) 10-325 MG tablet   Have you contacted your pharmacy to request a refill? No   Which pharmacy would you like this sent to?   [id number] - [place], [place] - [address] [place] [address] Phone: [phone] Fax: [phone]    Patient notified that their request is being sent to the clinical staff for review and that they should receive a response within 2 business days.   Please advise at Mobile [phone] (mobile)

## 2022-12-30 ENCOUNTER — Other Ambulatory Visit: Payer: Self-pay | Admitting: Family Medicine

## 2022-12-30 MED ORDER — OXYCODONE-ACETAMINOPHEN 10-325 MG PO TABS: ORAL_TABLET | ORAL | 0 refills | Status: DC

## 2022-12-30 NOTE — Telephone Encounter (Signed)
Pt informed of below.      Sandre Kitty, MD  You9 minutes ago (11:18 AM)    Refill sent in.  He won't be able to pick it up until 7/20 since his last fill was 6/20

## 2023-01-04 NOTE — Telephone Encounter (Signed)
NOTE NOT NEEDED ?

## 2023-01-06 ENCOUNTER — Ambulatory Visit (INDEPENDENT_AMBULATORY_CARE_PROVIDER_SITE_OTHER): Payer: BC Managed Care – PPO | Admitting: Family Medicine

## 2023-01-06 ENCOUNTER — Encounter: Payer: Self-pay | Admitting: Family Medicine

## 2023-01-06 ENCOUNTER — Telehealth: Payer: Self-pay | Admitting: *Deleted

## 2023-01-06 VITALS — BP 165/64 | HR 69 | Ht 73.0 in | Wt 303.1 lb

## 2023-01-06 DIAGNOSIS — M5442 Lumbago with sciatica, left side: Secondary | ICD-10-CM

## 2023-01-06 DIAGNOSIS — G8929 Other chronic pain: Secondary | ICD-10-CM

## 2023-01-06 DIAGNOSIS — R55 Syncope and collapse: Secondary | ICD-10-CM | POA: Diagnosis not present

## 2023-01-06 DIAGNOSIS — M5441 Lumbago with sciatica, right side: Secondary | ICD-10-CM

## 2023-01-06 DIAGNOSIS — E1169 Type 2 diabetes mellitus with other specified complication: Secondary | ICD-10-CM | POA: Diagnosis not present

## 2023-01-06 MED ORDER — BENZONATATE 100 MG PO CAPS: 100.0000 mg | ORAL_CAPSULE | Freq: Two times a day (BID) | ORAL | 0 refills | Status: DC | PRN

## 2023-01-06 MED ORDER — BENZOCAINE-MENTHOL 6-10 MG MT LOZG: 1.0000 | LOZENGE | OROMUCOSAL | 0 refills | Status: DC | PRN

## 2023-01-06 MED ORDER — BUDESONIDE-FORMOTEROL FUMARATE 80-4.5 MCG/ACT IN AERO
2.0000 | INHALATION_SPRAY | Freq: Two times a day (BID) | RESPIRATORY_TRACT | 3 refills | Status: DC
Start: 2023-01-06 — End: 2023-02-02

## 2023-01-06 NOTE — Telephone Encounter (Signed)
Pt in office today stating he has not gotten his ozempic yet, contacted pharmacy and they have it on hold stating it needs a PA.  Tried to do this through cover my meds and stated that [employer] could not answer clinical questions to call the number on back of insurance card. Contacted insurance and they said that the pt has 2 accounts and the one I gave him was not the same as what they had.  I contacted pharmacy and informed them of this and they will reach out to the insurance to see what is going on and will update once they find out.

## 2023-01-06 NOTE — Progress Notes (Signed)
Acute Office Visit  Subjective:     Patient ID: Christopher Green, male    DOB: December 17, 1970, 52 y.o.   MRN: [id number]  Chief Complaint  Patient presents with   Sore Throat    HPI Patient is in today for neurosurgery referral?   No more fainting episodes.    Patient finishes antibiotics about a week ago.  States that in the past 3 days he started getting a sore throat with ringing in his ears and runny nose.  He took 2 COVID test which were negative.  Nobody else near him has been sick.  Has also had coughing due to this.  But has not had any more episodes of fainting when he coughs.  Back pain-patient states he "only talked on the phone" with the previous neuro surgery clinic, [facility].  States that surgery was for his low back pain with radiculopathy bilaterally.  States the surgery is to correct his spinal canal stenosis.  Patient has no urinary or bowel incontinence.  Has daily back and leg pain with paresthesias in the lower extremities bilaterally.  Has pain with sitting standing and laying down.  Patient states that when he went to pick up his Ozempic they stated approval was pending and he has not heard back from anybody yet.   ROS      Objective:    BP (!) 165/64   Pulse 69   Ht 6\' 1"  (1.854 m)   Wt (!) 303 lb 1.9 oz (137.5 kg)   SpO2 96%   BMI 39.99 kg/m    Physical Exam General: Alert, oriented HEENT: No significant facial swelling.  Bruising has improved since last appointment Pulmonary: No wheezes, no inspiratory crackles.  No respiratory distress. GI: Large pannus Extremities: Mild nonpitting edema bilaterally  No results found for any visits on 01/06/23.      Assessment & Plan:   Chronic bilateral low back pain with bilateral sciatica Assessment & Plan: Patient wants neurosurgery referral to someplace in [place].  Still having significant pain with radiation down the legs bilaterally.  Denies incontinence.  Pain occurs in all  positions.  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  Severe bilateral neuroforaminal narrowing and moderate to severe central canal stenosis. - Will send a referral to neurosurgery - Continue current pain medication regimen. - Advised patient that at his current BMI he may not be a candidate for elective surgery until this improves.  Encourage patient to start his Ozempic.   Orders: -     Ambulatory referral to Neurosurgery  Situational syncope Assessment & Plan: Has not had any more syncopal episodes since last time I saw him.  He is still having periodic coughing.  Takes his Symbicort as needed.  Recently has runny nose, ringing in his ears and more frequent cough.  He took 2 recent COVID test which were negative.  Likely has some kind of viral URI.  Had just finished antibiotics 1 week ago. - Advised patient to use Symbicort scheduled twice a day to help prevent coughing episodes - Also sent in Tessalon Perles to help prevent coughing   Type 2 diabetes mellitus with other specified complication, without long-term current use of insulin (HCC) Assessment & Plan: Will reach out to pharmacy to see why patient was unable to pick up this medication.  Has previous diagnosis of DM2.  Do not have records of his original A1c's prior to treatment.  Does have a fasting glucose from 05/28/2022 that was 128. - Continue metformin.  Continue Ozempic.   Other orders -     Benzonatate; Take 1 capsule (100 mg total) by mouth 2 (two) times daily as needed for cough.  Dispense: 20 capsule; Refill: 0 -     Budesonide-Formoterol Fumarate; Inhale 2 puffs into the lungs 2 (two) times daily.  Dispense: 1 each; Refill: 3 -     Benzocaine-Menthol; Take 1 lozenge by mouth as needed for sore throat.  Dispense: 100 tablet; Refill: 0     Return in about 2 months (around 03/09/2023) for DM.  Sandre Kitty, MD

## 2023-01-06 NOTE — Patient Instructions (Signed)
It was nice to see you today,  We addressed the following topics today: - I would like you to use your symbicort twice a day to see if that helps your cough.  - you can also use a cough medicine called tesselon perles to help reduce cough - I sent in a neurosurgery referral.  Someone should call you - I will work on getting your ozempic filled.    Have a great day,  Frederic Jericho, MD

## 2023-01-10 DIAGNOSIS — G8929 Other chronic pain: Secondary | ICD-10-CM | POA: Insufficient documentation

## 2023-01-10 NOTE — Assessment & Plan Note (Addendum)
Patient wants neurosurgery referral to someplace in [place].  Still having significant pain with radiation down the legs bilaterally.  Denies incontinence.  Pain occurs in all positions.  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  Severe bilateral neuroforaminal narrowing and moderate to severe central canal stenosis. - Will send a referral to neurosurgery - Continue current pain medication regimen. - Advised patient that at his current BMI he may not be a candidate for elective surgery until this improves.  Encourage patient to start his Ozempic.

## 2023-01-10 NOTE — Assessment & Plan Note (Signed)
Will reach out to pharmacy to see why patient was unable to pick up this medication.  Has previous diagnosis of DM2.  Do not have records of his original A1c's prior to treatment.  Does have a fasting glucose from 05/28/2022 that was 128. - Continue metformin.  Continue Ozempic.

## 2023-01-10 NOTE — Assessment & Plan Note (Signed)
Has not had any more syncopal episodes since last time I saw him.  He is still having periodic coughing.  Takes his Symbicort as needed.  Recently has runny nose, ringing in his ears and more frequent cough.  He took 2 recent COVID test which were negative.  Likely has some kind of viral URI.  Had just finished antibiotics 1 week ago. - Advised patient to use Symbicort scheduled twice a day to help prevent coughing episodes - Also sent in Tessalon Perles to help prevent coughing

## 2023-01-14 DIAGNOSIS — Z419 Encounter for procedure for purposes other than remedying health state, unspecified: Secondary | ICD-10-CM | POA: Diagnosis not present

## 2023-01-25 ENCOUNTER — Telehealth: Payer: Self-pay

## 2023-01-25 NOTE — Telephone Encounter (Signed)
Pt is calling requesting a Rx for Gabapentin for his feet discomfort.

## 2023-01-26 ENCOUNTER — Other Ambulatory Visit: Payer: Self-pay | Admitting: Family Medicine

## 2023-01-26 MED ORDER — GABAPENTIN 300 MG PO CAPS
300.0000 mg | ORAL_CAPSULE | Freq: Two times a day (BID) | ORAL | 1 refills | Status: DC
Start: 2023-01-26 — End: 2023-05-25

## 2023-01-26 NOTE — Telephone Encounter (Signed)
Pt calling to inquire about this medication request.

## 2023-01-26 NOTE — Telephone Encounter (Signed)
Patient has not been taking gabapentin for his neuropathy for at least 2 years.  He is currently prescribed duloxetine for his neuropathy.  Does the patient want a refill of duloxetine or does he want a new prescription for gabapentin?

## 2023-01-26 NOTE — Telephone Encounter (Signed)
[facility] [place].

## 2023-01-28 NOTE — Progress Notes (Unsigned)
Referring Physician:  Sandre Kitty, MD [address] [place],  [place] [address]  Primary Physician:  Christopher Kitty, MD  History of Present Illness: 02/02/2023 [name] has a history of HTN, atrial flutter, venous insufficiency, DM, peripheral polyneuropathy, hyperlipidemia, obesity, and chronic pain.   He has a pacemaker.   Has seen [facility] in the past and they discussed surgery for spinal stenosis.   He has constant LBP with bilateral posterior leg pain to his feet x years. Feels like his feet are "being hit with hammers." Pain is worse with walking and standing in one position. He does okay with sitting and laying flat. He has numbness, tingling, and weakness in his legs.   He has some relief with neurontin.   Bowel/Bladder Dysfunction: none  Conservative measures:  Physical therapy: None Multimodal medical therapy including regular antiinflammatories: Oxycodone, Gabapentin, Tylenol, Ibuprofen, cymbalta  Injections:  Has done ESIs in past with relief  Past Surgery: no  Christopher Green has no symptoms of cervical myelopathy. He has bilateral carpal tunnel, had surgery on the left.   The symptoms are causing a significant impact on the patient's life.   Review of Systems:  A 10 point review of systems is negative, except for the pertinent positives and negatives detailed in the HPI.  Past Medical History: Past Medical History:  Diagnosis Date   Hypertension     Past Surgical History: Past Surgical History:  Procedure Laterality Date   CARDIAC CATHETERIZATION  2019   clean cors   PACEMAKER IMPLANT N/A 08/17/2022   Procedure: PACEMAKER IMPLANT;  Surgeon: Marinus Maw, MD;  Location: [facility];  Service: Cardiovascular;  Laterality: N/A;    Allergies: Allergies as of 02/02/2023   (No Known Allergies)    Medications: Outpatient Encounter Medications as of 02/02/2023  Medication Sig   atorvastatin (LIPITOR) 40 MG  tablet Take 1 tablet (40 mg total) by mouth daily.   carvedilol (COREG) 3.125 MG tablet Take 1 tablet (3.125 mg total) by mouth 2 (two) times daily with a meal.   cetirizine (ZYRTEC) 10 MG tablet Take 1 tablet (10 mg total) by mouth daily.   DULoxetine (CYMBALTA) 30 MG capsule Take 1 capsule (30 mg total) by mouth 2 (two) times daily.   gabapentin (NEURONTIN) 300 MG capsule Take 1 capsule (300 mg total) by mouth 2 (two) times daily.   hydrALAZINE (APRESOLINE) 100 MG tablet Take 1 tablet (100 mg total) by mouth 2 (two) times daily.   metFORMIN (GLUCOPHAGE) 500 MG tablet Take 500 mg by mouth daily.   Multiple Vitamins-Minerals (MULTIVITAMIN WITH MINERALS) tablet Take 1 tablet by mouth daily.   oxyCODONE-acetaminophen (PERCOCET) 10-325 MG tablet Take one tablet every 4 to 6 hours as needed for pain   OZEMPIC, 0.25 OR 0.5 MG/DOSE, 2 MG/3ML SOPN Inject into the skin.   rivaroxaban (XARELTO) 20 MG TABS tablet Take 1 tablet (20 mg total) by mouth daily with supper.   sildenafil (VIAGRA) 100 MG tablet Take 0.5-1 tablets (50-100 mg total) by mouth daily as needed for erectile dysfunction.   spironolactone (ALDACTONE) 25 MG tablet Take 1 tablet (25 mg total) by mouth daily.   testosterone cypionate (DEPOTESTOSTERONE CYPIONATE) 200 MG/ML injection Inject 1 mL (200 mg total) into the muscle every 14 (fourteen) days.   valsartan-hydrochlorothiazide (DIOVAN-HCT) 320-25 MG tablet Take 1 tablet by mouth daily.   [DISCONTINUED] benzocaine-menthol (CHLORAEPTIC) 6-10 MG lozenge Take 1 lozenge by mouth as needed for  sore throat.   [DISCONTINUED] benzonatate (TESSALON) 100 MG capsule Take 1 capsule (100 mg total) by mouth 2 (two) times daily as needed for cough.   [DISCONTINUED] budesonide-formoterol (SYMBICORT) 80-4.5 MCG/ACT inhaler Inhale 2 puffs into the lungs 2 (two) times daily.   [DISCONTINUED] Docusate Calcium (STOOL SOFTENER PO) Take 1 tablet by mouth daily as needed.   [DISCONTINUED] DULoxetine (CYMBALTA) 30  MG capsule Take 1 capsule (30 mg total) by mouth daily.   [DISCONTINUED] terbinafine (LAMISIL) 250 MG tablet Take 250 mg by mouth daily.   No facility-administered encounter medications on file as of 02/02/2023.    Social History: Social History   Tobacco Use   Smoking status: Every Day    Types: Cigarettes    Passive exposure: Current   Smokeless tobacco: Never  Substance Use Topics   Alcohol use: Yes    Comment: daily   Drug use: Never    Family Medical History: Family History  Problem Relation Age of Onset   Heart attack Father     Physical Examination: Vitals:   02/02/23 0909  BP: (!) 148/92    General: Patient is well developed, well nourished, calm, collected, and in no apparent distress. Attention to examination is appropriate.  Respiratory: Patient is breathing without any difficulty.   NEUROLOGICAL:     Awake, alert, oriented to person, place, and time.  Speech is clear and fluent. Fund of knowledge is appropriate.   Cranial Nerves: Pupils equal round and reactive to light.  Facial tone is symmetric.    Mild lower posterior lumbar tenderness.   No abnormal lesions on exposed skin.   Strength: Side Biceps Triceps Deltoid Interossei Grip Wrist Ext. Wrist Flex.  R 5 5 5 5 5 5 5   L 5 5 5 5 5 5 5    Side Iliopsoas Quads Hamstring PF DF EHL  R 5 5 5 5 5 5   L 5 5 5 5 5 5    Reflexes are 2+ and symmetric at the biceps, brachioradialis, patella and achilles.   Hoffman's is absent.  Clonus is not present.   Bilateral upper and lower extremity sensation is intact to light touch.     Gait is abnormal- he limps.   Medical Decision Making  Imaging: MRI lumbar spine dated 12/19/19:  FINDINGS: Segmentation: There are 5 non-rib bearing lumbar type vertebral bodies with the last intervertebral disc space labeled as L5-S1.   Alignment:  Normal   Vertebrae: The vertebral body heights are well maintained. No fracture, marrow edema,or pathologic marrow  infiltration.   Conus medullaris and cauda equina: Conus extends to the L1 level. Conus and cauda equina appear normal.   Paraspinal and other soft tissues: The paraspinal soft tissues and visualized retroperitoneal structures are unremarkable. The sacroiliac joints are intact.   Disc levels:   T12-L1:  No significant canal or neural foraminal narrowing.   L1-L2: There is a broad-based disc bulge with ligamentum flavum hypertrophy which causes mild bilateral neural foraminal narrowing.   L2-L3: There is a broad-based disc bulge with ligamentum flavum hypertrophy and facet arthrosis which causes mild bilateral neural foraminal narrowing and mild central canal stenosis.   L3-L4: There is a broad-based disc bulge with a small central disc protrusion facet arthrosis and ligamentum flavum hypertrophy. The disc protrusion contacts the bilateral descending L4 nerve roots without impingement. There is mild-to-moderate bilateral neural foraminal narrowing and mild central canal stenosis.   L4-L5: There is a broad-based disc bulge with a central disc protrusion which contacts  the bilateral descending L5 nerve roots. Facet arthrosis and ligamentum flavum hypertrophy are present which causes severe bilateral neural foraminal narrowing. The central thecal sac measures 5 mm in AP diameter.   L5-S1: There is a broad-based disc bulge with facet arthrosis and ligamentum flavum hypertrophy which causes moderate to severe left and moderate right neural foraminal narrowing.   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 There is severe bilateral neural foraminal narrowing and moderate to severe central canal stenosis.     Electronically Signed   By: Jonna Clark M.D.   On: 12/19/2019 14:46  I have personally reviewed the images and agree with the above interpretation.  Assessment and  Plan: Mr. Craddick is a pleasant 52 y.o. male has constant LBP with bilateral posterior leg pain to his feet x years. Feels like his feet are "being hit with hammers." Pain is worse with walking and standing in one position. He has numbness, tingling, and weakness in his legs.   MRI from 2021 showed lumbar spondylosis with multilevel foraminal stenosis. He has mild/moderate central stenosis L4-L5 with severe bilateral foraminal stenosis.   Treatment options discussed with patient and following plan made:   - MRI of lumbar spine ordered to further evaluate lumbar radiculpathy. He has pacemaker and card says it is MRI conditional. Card scanned into chart. Will have radiology make sure it is compatible for MRI. He requests WIDE BORE MRI.  - If unable to have MRI, then will change to CT of lumbar spine.  - Depending on results, may revisit PT and/or injections.  - Will schedule follow up visit to review MRI results once I get them back.   I spent a total of 35 minutes in face-to-face and non-face-to-face activities related to this patient's care today including review of outside records, review of imaging, review of symptoms, physical exam, discussion of differential diagnosis, discussion of treatment options, and documentation.   Thank you for involving me in the care of this patient.   Drake Leach PA-C Dept. of Neurosurgery

## 2023-02-01 ENCOUNTER — Ambulatory Visit (INDEPENDENT_AMBULATORY_CARE_PROVIDER_SITE_OTHER): Payer: BC Managed Care – PPO | Admitting: Family Medicine

## 2023-02-01 ENCOUNTER — Encounter: Payer: Self-pay | Admitting: Family Medicine

## 2023-02-01 VITALS — BP 137/78 | HR 72 | Ht 73.0 in | Wt 299.1 lb

## 2023-02-01 DIAGNOSIS — G629 Polyneuropathy, unspecified: Secondary | ICD-10-CM | POA: Diagnosis not present

## 2023-02-01 DIAGNOSIS — E1169 Type 2 diabetes mellitus with other specified complication: Secondary | ICD-10-CM | POA: Diagnosis not present

## 2023-02-01 DIAGNOSIS — E291 Testicular hypofunction: Secondary | ICD-10-CM | POA: Diagnosis not present

## 2023-02-01 DIAGNOSIS — R55 Syncope and collapse: Secondary | ICD-10-CM | POA: Diagnosis not present

## 2023-02-01 MED ORDER — DULOXETINE HCL 30 MG PO CPEP: 30.0000 mg | ORAL_CAPSULE | Freq: Two times a day (BID) | ORAL | 1 refills | Status: DC

## 2023-02-01 NOTE — Progress Notes (Unsigned)
   Established Patient Office Visit  Subjective   Patient ID: Christopher Green, male    DOB: 10-06-1970  Age: 52 y.o. MRN: [id number]  No chief complaint on file.   HPI  Leg pain - added gabapentin  Dm2 - ozempic? 0.5 after next dose.    Testosterone - recheck? When was the last time he dosed it?   HTN  Neuro appt tomorrow - past out 5 times Saturday  Passing out - usually out in the sun.  Getting hot and sweaty.  One day, several episodes.    The ASCVD Risk score (Arnett DK, et al., 2019) failed to calculate for the following reasons:   Cannot find a previous HDL lab   Cannot find a previous total cholesterol lab  Health Maintenance Due  Topic Date Due   COVID-19 Vaccine (1) Never done   FOOT EXAM  Never done   OPHTHALMOLOGY EXAM  Never done   Diabetic kidney evaluation - Urine ACR  Never done   Hepatitis C Screening  Never done   DTaP/Tdap/Td (1 - Tdap) Never done   Zoster Vaccines- Shingrix (1 of 2) Never done   Colonoscopy  Never done   INFLUENZA VACCINE  01/14/2023      Objective:     There were no vitals taken for this visit. {Vitals History (Optional):23777}  Physical Exam   No results found for any visits on 02/01/23.      Assessment & Plan:   There are no diagnoses linked to this encounter.   No follow-ups on file.    Sandre Kitty, MD

## 2023-02-01 NOTE — Patient Instructions (Signed)
It was nice to see you today,  We addressed the following topics today: -I have sent in your duloxetine.  Take this once in the morning and once in the afternoon. - I will check your testosterone level, and let you know what to do after records. - I will send in a prescription for an increased dose of your Ozempic for 1 month from now. You take your Ozempic next week increase it to 0.5 mg - Follow-up with me in 1 month - Do not do anything that could trigger a syncopal episode such as being in the heat, overexerting yourself, try to avoid coughing.   Have a great day,  Frederic Jericho, MD

## 2023-02-02 ENCOUNTER — Ambulatory Visit (INDEPENDENT_AMBULATORY_CARE_PROVIDER_SITE_OTHER): Payer: BC Managed Care – PPO | Admitting: Orthopedic Surgery

## 2023-02-02 ENCOUNTER — Other Ambulatory Visit: Payer: Self-pay | Admitting: Family Medicine

## 2023-02-02 ENCOUNTER — Telehealth: Payer: Self-pay | Admitting: *Deleted

## 2023-02-02 ENCOUNTER — Encounter: Payer: Self-pay | Admitting: Orthopedic Surgery

## 2023-02-02 VITALS — BP 132/84 | Ht 73.0 in | Wt 275.0 lb

## 2023-02-02 DIAGNOSIS — M4726 Other spondylosis with radiculopathy, lumbar region: Secondary | ICD-10-CM | POA: Diagnosis not present

## 2023-02-02 DIAGNOSIS — M48061 Spinal stenosis, lumbar region without neurogenic claudication: Secondary | ICD-10-CM

## 2023-02-02 DIAGNOSIS — M47816 Spondylosis without myelopathy or radiculopathy, lumbar region: Secondary | ICD-10-CM

## 2023-02-02 DIAGNOSIS — M5416 Radiculopathy, lumbar region: Secondary | ICD-10-CM

## 2023-02-02 LAB — CBC
Hematocrit: 41.1 % (ref 37.5–51.0)
Hemoglobin: 13.9 g/dL (ref 13.0–17.7)
MCH: 32 pg (ref 26.6–33.0)
MCHC: 33.8 g/dL (ref 31.5–35.7)
MCV: 95 fL (ref 79–97)
Platelets: 241 10*3/uL (ref 150–450)
RBC: 4.35 x10E6/uL (ref 4.14–5.80)
RDW: 13.2 % (ref 11.6–15.4)
WBC: 9.1 10*3/uL (ref 3.4–10.8)

## 2023-02-02 LAB — HEMOGLOBIN A1C
Est. average glucose Bld gHb Est-mCnc: 171 mg/dL
Hgb A1c MFr Bld: 7.6 % — ABNORMAL HIGH (ref 4.8–5.6)

## 2023-02-02 LAB — TESTOSTERONE: Testosterone: 91 ng/dL — ABNORMAL LOW (ref 264–916)

## 2023-02-02 MED ORDER — OXYCODONE-ACETAMINOPHEN 10-325 MG PO TABS: ORAL_TABLET | ORAL | 0 refills | Status: DC

## 2023-02-02 NOTE — Telephone Encounter (Signed)
Prescription Request  02/02/2023  LOV: 02/01/23  What is the name of the medication or equipment?   oxyCODONE-acetaminophen (PERCOCET) 10-325 MG tablet   Have you contacted your pharmacy to request a refill? No   Which pharmacy would you like this sent to?   [id number] - [place], [place] - [address] [place] [address] Phone: [phone] Fax: [phone]    Patient notified that their request is being sent to the clinical staff for review and that they should receive a response within 2 business days.

## 2023-02-02 NOTE — Patient Instructions (Signed)
It was so nice to see you today. Thank you so much for coming in.    Your imaging from 2021 shows wear and tear in your back.   I want to get an updated MRI of your bacl to look into things further. They will need to be sure this is okay with your pacemaker. I did request a WIDE BORE MRI. If this cannot be done, we can do a CT scan.   After you have the MRI, it takes 5-7 days for me to get the results back. Once I have them, we will call you to schedule a follow up visit with me to review them.   Please do not hesitate to call if you have any questions or concerns. You can also message me in MyChart.   Drake Leach PA-C [phone]

## 2023-02-02 NOTE — Telephone Encounter (Signed)
Pt informed of below.  

## 2023-02-02 NOTE — Telephone Encounter (Signed)
Refill sent in

## 2023-02-03 NOTE — Assessment & Plan Note (Signed)
Patient was finally able to get approved for Ozempic (we did not have a A1c above 6.5 on file since this was diagnosed years ago at a different location).  Coincidentally, his A1c is now above 6.5 for the first time today (7.1).  Advised patient to increase his dose from 0.25-0.5 at his next injection and then 1 mg/week 1 month after that if he tolerates it.  Also continue metformin.

## 2023-02-03 NOTE — Assessment & Plan Note (Signed)
Patient has a habit of making large self adjustments to his medications and reaction to symptoms or lab test.  Patient originally increased his testosterone dose to supratherapeutic levels on his own.  When his testosterone came back greater than 1500 we had discussed going back to his appropriate dose and rechecking it.  Patient instead stopped taking his testosterone altogether.  Will recheck it again today.  Expect it to be low.  He may be more appropriate for topical testosterone therapy despite its more variable absorption since it is less susceptible to self adjustments and could avoid these large swings in testosterone levels

## 2023-02-03 NOTE — Assessment & Plan Note (Signed)
Increasing dose of duloxetine from once daily to twice daily.  Recently added gabapentin as well.  Patient has neurosurgery appointment upcoming later this week.  Will likely need surgical treatment if he is an appropriate candidate.  Continue with Ozempic to help reduce weight to improve symptoms and make him more appropriate for possible surgery.

## 2023-02-03 NOTE — Assessment & Plan Note (Signed)
Peers to be triggered by heat/exertion.  Also triggered by coughing.  Appears to have been spells where he goes long periods of time without syncopal episodes and then has multiple in the course of 1 or 2 days.  Has a recently implanted pacemaker that is being adjusted and interrogated on a routine basis.  Has another appointment for adjusting pacemaker in 2 weeks.  Counseled patient on avoiding triggers.  Encouraged him to use inhalers were prescribed to reduce coughing frequency, to avoid going out in excessive heat or overexerting himself.

## 2023-02-08 ENCOUNTER — Telehealth: Payer: Self-pay | Admitting: Internal Medicine

## 2023-02-08 NOTE — Telephone Encounter (Signed)
Disregard note below as it was on wrong patient.

## 2023-02-08 NOTE — Progress Notes (Unsigned)
Cardiology Office Note    Date:  02/09/2023  ID:  Christopher Green, Christopher Green [birth year], MRN [id number] PCP:  Sandre Kitty, MD  Cardiologist:  Thomasene Ripple, DO  Electrophysiologist:  None   Chief Complaint: multiple episodes of passing out  History of Present Illness: .    Christopher Green is a 52 y.o. 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  He was remotely followed by Dr. Judithe Modest. He had an abnormal stress test/elevated troponin in setting of high BP in 2019 prompting cath showing minimal CAD (scattered 20% or less). Our team met him in 05/2022 when he was admitted with dyspnea, found to have PE and LLE DVT. 2D echo showed EF 65 to 70%, severe asymmetric LVH of the septal segment, normal RV, small pericardial effusion without significant tamponade with LVH felt likely related to HTN. Dr. Servando Salina did not feel he required cMRI. He had run out of his medications prior to that admission. He was found to have HR in the upper 30s-40s with varying degrees of heart block (Mobitz 1, Mobitz 2, brief CHB, ectopic atrial rhythm with possible 2:1 AVB). Watchful waiting was recommended by EP. He was seen by Dr. Ladona Ridgel as outpatient and ultimately underwent STJ PPM 08/2022. Initial device interrogation reported 85% Afib burden. Dr. Ladona Ridgel also noted atrial flutter in EKG interpretation during 11/2022 OV. Do not see further commentary. Of note he saw heme-onc who found positive lupus anticoagulant, elevated factor VIII and recommended lifelong anticoagulation for antiphospholipid antibody syndrome.  He is seen today for numerous recurrent episodes of fall/syncope. The first episode happened 12/07/22. He suffered facial injuries and  primary care obtained head CT 12/08/22 showing "Recent blowout fractures are seen in the medial wall and floor of left orbit. High density fluid seen in left maxillary antrum may suggest blood in the sinus due to recent injury. Left periorbital hematoma is noted in subcutaneous plane. Optic globes and retrobulbar soft tissues are unremarkable." This was initially felt situational due to cough. Primary care had reviewed case with ophthalmology and he did not require eye intervention. The patient was also treated for sinus infection at that time. He has also been followed for sciatica with chronic low back pain. He was seen by PCP in follow-up in July without further episodes.   He presents today for multiple additional episodes of syncope. It started back up within the last 2 weeks. Otherwise the details are somewhat vague and varied. He reports it's happened too many times to recall exactly what happened each time. Some happen when he is sitting, others happen when he is up walking around. He does not get any warning. He's not sure if he fully passes out. Other times he feels like he just "falls" and then realizes he's on the ground. He does not think he is out for more than a moment or so. Some episodes happen outside getting out of his truck, other episodes have happened in his home in the living room. He denies any seizure activity or b/b incontinence. He reports he passed at least once if not twice on Thursday, Friday, Saturday, and Sunday. (Today is Tuesday.) He believes he hit his head on Saturday. He reports his blood pressure was in the 70s all day that  day. On another occasion he observed a BP of 51 systolic. When he checked his BP on Sunday after passing out it was 175/58. He stopped taking his valsartan-hydrochlorothiazide on Sunday. He has not had another episode since then. His device was interrogated per phone note yesterday. Per EP device team "Presenting rhythm appear AF w/ controlled ventricular  rates. Undersensing noted on RA lead, not a new finding. No data collected on the transmission that would correlate with syncope." EKG today shows continued atrial flutter with controlled ventricular response, similar to 11/2022. Of note he also started Ozempic last month. His weight has been extremely variable, was 287lb in 09/2022, 308lb in 11/2022, 292lb in 12/2022, and 298lb today. No CP, SOB, or significant LE edema on exam today.  Orthostatics: Lying 152/86, HR 72bpm Sitting 126/78, HR 69bpm Standing 116/73, HR 53bpm standing 136/76, HR 75bpm  Labwork independently reviewed: 01/2023 CBC wnl, A1C 7.6 07/2022 K 4.3, Cr 1.0, AST ALT OK, Mg 2.0 05/2022 TSH ok  ROS: .    Please see the history of present illness All other systems are reviewed and otherwise negative.  Studies Reviewed: Marland Kitchen    EKG:  EKG is ordered today, personally reviewed, demonstrating atrial flutter 70bpm with ventricular pacing  CV Studies: Cardiac studies reviewed are outlined and summarized above. Otherwise please see EMR for full report.  Cath 2019  Angiographic findings   Cardiac Arteries and Lesion Findings  LMCA: Normal appearance with 0% stenosis.  LAD: Abnormal.  Proximal - 20%  Mid - 20% diffuse  Diagonal - 20%  LCx: Abnormal.  Proximal - 20%  RCA: Abnormal.  Proximal - 20%   Current Reported Medications:.    Current Meds  Medication Sig   atorvastatin (LIPITOR) 40 MG tablet Take 1 tablet (40 mg total) by mouth daily.   carvedilol (COREG) 3.125 MG tablet Take 1 tablet (3.125 mg total) by mouth 2 (two) times daily with a meal.   cetirizine (ZYRTEC) 10 MG tablet Take 1 tablet (10 mg total) by mouth daily.   DULoxetine (CYMBALTA) 30 MG capsule Take 1 capsule (30 mg total) by mouth 2 (two) times daily.   gabapentin (NEURONTIN) 300 MG capsule Take 1 capsule (300 mg total) by mouth 2 (two) times daily.   hydrALAZINE (APRESOLINE) 100 MG tablet Take 1 tablet (100 mg total) by mouth 2 (two) times daily.    metFORMIN (GLUCOPHAGE) 500 MG tablet Take 500 mg by mouth daily.   Multiple Vitamins-Minerals (MULTIVITAMIN WITH MINERALS) tablet Take 1 tablet by mouth daily.   oxyCODONE-acetaminophen (PERCOCET) 10-325 MG tablet Take one tablet every 4 to 6 hours as needed for pain   OZEMPIC, 0.25 OR 0.5 MG/DOSE, 2 MG/3ML SOPN Inject into the skin.   rivaroxaban (XARELTO) 20 MG TABS tablet Take 1 tablet (20 mg total) by mouth daily with supper.   sildenafil (VIAGRA) 100 MG tablet Take 0.5-1 tablets (50-100 mg total) by mouth daily as needed for erectile dysfunction.   spironolactone (ALDACTONE) 25 MG tablet Take 1 tablet (25 mg total) by mouth daily.   testosterone cypionate (DEPOTESTOSTERONE CYPIONATE) 200 MG/ML injection Inject 1 mL (200 mg total) into the muscle every 14 (fourteen) days.   valsartan-hydrochlorothiazide (DIOVAN-HCT) 320-25 MG tablet Take 1 tablet by mouth daily.    Physical Exam:    VS:  BP 139/82   Pulse 70   Ht 6\' 1"  (1.854 m)   Wt 298 lb 9.6 oz (135.4 kg)   SpO2 96%   BMI 39.40 kg/m  Wt Readings from Last 3 Encounters:  02/09/23 298 lb 9.6 oz (135.4 kg)  02/02/23 275 lb (124.7 kg)  02/01/23 299 lb 1.9 oz (135.7 kg)    GEN: Well nourished, well developed in no acute distress NECK: No JVD; No carotid bruits CARDIAC: RRR, no murmurs, rubs, gallops RESPIRATORY:  Clear to auscultation without rales, wheezing or rhonchi  ABDOMEN: Soft, non-tender, non-distended EXTREMITIES:  No edema; No acute deformity   Asessement and Plan:.    1. Syncope - patient has difficult time quantifying how many episodes he's had as there have been numerous under a variety of circumstances. Per device team, his pacemaker interrogation did not show any arrhythmias that would produce episode of syncope. I am very concerned about the fact that he has past out/fallen multiple times over the past two weeks ad is on anticoagulation. He is high risk to discontinue this due to h/o unprovoked VTE and  antiphospholipid antibody syndrome. I have recommended he proceed emergently to the emergency room for evaluation and repeat head CT to ensure safe to continue anticoagulation. He is adamant that he does not wish to go. He understands the risk of death. Some of his episodes were associated with what sounds like profoundly low blood pressure of unclear etiology. We sometimes see this with patients losing weight on GLP1 no longer requiring as high of dose of antihypertensive as prior, but his overall weight is actually a few pounds higher than when he was seen in April. He just self discontinued valsartan-hydrochlorothiazide which I agree with. Repeat orthostatics here did demonstrate a decline in SBP upon standing, but he did not become fully hypotensive and was asymptomatic today. Therefore I would favor staying OFF the valsartan-hydrochlorothiazide and seeing how he does. Will check stat labs to infer whether we should hold any additional agents, I.e. spironolactone if AKI present. Will order BMET, CBC, TSH, d-dimer, troponin level. Will request stat head CT. Will request expedited 2D echocardiogram looking for evidence of HCM or pericardial effusion. He understands these things may take several days to accomplish and would be able to be obtained in the hospital expeditiously but he refuses to go. If d-dimer is abnormal and renal function is normal, would need to proceed with CT angio tomorrow (he would not go to hospital today). His pacemaker did not show any arrhythmias which would cause syncope - he's had atrial fib/flutter identified since initial interrogation 08/2022 and I would suggest f/u with EP to discuss strategy for this going forward. We also discussed no driving until cleared by his cardiology team, needs to be free of syncope for at least 6 months.  2. PAT/Paroxysmal atrial flutter superimposed on h/o PPM - as outlined above. I told him we are in a very difficult place where we simply do not know  for sure that it is safe to continue his blood thinner or not, but he is also high risk to discontinue acutely given his clotting disorder. He declines emergency room care. Device team had recommended to reprogram PPM at today's OV ("Program RA sensitivity to auto 0.40mV or try unipolar" per phone note yesterday). Recommend formal EP f/u to address strategy for rhythm management.  3. Mild CAD - check troponin with labs. If elevated would again reiterate recommendation to go to ER but doubt underlying ACS clinically otherwise. Can revisit plan for lipid management in follow-up once acute issue settles down.  4. H/o DVT/PE - recommended previously for long term anticoagulation by hematology. This was briefly interrupted in  June when he was first evaluated for syncope but he reports adherence to medication regimen since then. Very difficult situation as above. Checking d-dimer above.   5. LVH - will reassess by echocardiogram. Query whether this could represent hypertrophic cardiomyopathy with gradient issues in the setting of ARB/hydrochlorothiazide use.    Disposition: F/u with EP APP next available appointment, and gen cards within 4 weeks. Also recommended ASAP f/u PCP in case episodes are neurologic in nature, I.e. seizure activity.  Signed, Laurann Montana, PA-C

## 2023-02-08 NOTE — Telephone Encounter (Signed)
I have called pt and walked him through to send a transmission. Pt would like a call back from nurse

## 2023-02-08 NOTE — Telephone Encounter (Signed)
Spoke to [place] from [place] who advised patients pacemaker does not vibrate. Patient called and advised.   Patient is certain the device vibrated. Since unable to get remote transmission will bring patient into the device clinic for apt.   Pt has apt 02/09/23 @ 2:45 PM. Will check device at apt. Note placed in apt.

## 2023-02-08 NOTE — Telephone Encounter (Signed)
Patient states yesterday he fainted more than once. BP was 70/40. When he recheck his BP it was 175/58. No heart rate. He states he was asymptomatic before he passed out. Today he is asymptomatic. He does not want to go to ED. Discussed ED precautions. As appointment  with Ronie Spies tomorrow.   Patient needs help to send manual transmission.

## 2023-02-08 NOTE — Telephone Encounter (Signed)
Pt c/o Syncope: STAT if syncope occurred within 30 minutes and pt complains of lightheadedness High Priority if episode of passing out, completely, today or in last 24 hours   Did you pass out today? No  When is the last time you passed out? Yesterday  Has this occurred multiple times? Yes   Did you have any symptoms prior to passing out? BP is low 70/40, but  Yesterday after he passed out BP was 175/58  Patient stated he has passed out everyday last week. Patient states one day when he passed out his BP was low (70/40), but yesterday when he passed out his BP was 175/58. Patient is very concerned and has no idea what could be causing this. Please advise.

## 2023-02-08 NOTE — Telephone Encounter (Signed)
Remote transmission received. Presenting rhythm appear AF w/ controlled ventricular rates. Undersensing noted on RA lead, not a new finding. No data collected on the transmission that would correlate with syncope. Pt aware of apt tomorrow.   Spoke w/ Chriss Driver who agrees with findings. Recommends at next apt. Program RA sensitivity to auto 0.23mV or try unipolar.

## 2023-02-09 ENCOUNTER — Ambulatory Visit: Payer: BC Managed Care – PPO | Attending: Physician Assistant | Admitting: Physician Assistant

## 2023-02-09 ENCOUNTER — Ambulatory Visit: Payer: BC Managed Care – PPO | Attending: Internal Medicine

## 2023-02-09 ENCOUNTER — Encounter: Payer: Self-pay | Admitting: Physician Assistant

## 2023-02-09 VITALS — BP 139/82 | HR 70 | Ht 73.0 in | Wt 298.6 lb

## 2023-02-09 DIAGNOSIS — I517 Cardiomegaly: Secondary | ICD-10-CM | POA: Diagnosis not present

## 2023-02-09 DIAGNOSIS — I4819 Other persistent atrial fibrillation: Secondary | ICD-10-CM

## 2023-02-09 DIAGNOSIS — I4892 Unspecified atrial flutter: Secondary | ICD-10-CM

## 2023-02-09 DIAGNOSIS — I2510 Atherosclerotic heart disease of native coronary artery without angina pectoris: Secondary | ICD-10-CM

## 2023-02-09 DIAGNOSIS — R55 Syncope and collapse: Secondary | ICD-10-CM | POA: Diagnosis not present

## 2023-02-09 DIAGNOSIS — D6861 Antiphospholipid syndrome: Secondary | ICD-10-CM

## 2023-02-09 DIAGNOSIS — I442 Atrioventricular block, complete: Secondary | ICD-10-CM

## 2023-02-09 LAB — CUP PACEART INCLINIC DEVICE CHECK
Battery Remaining Longevity: 111 mo
Battery Voltage: 2.98 V
Brady Statistic RA Percent Paced: 18 %
Brady Statistic RV Percent Paced: 59 %
Date Time Interrogation Session: 20240827202847
Implantable Lead Connection Status: 753985
Implantable Lead Connection Status: 753985
Implantable Lead Implant Date: 20240304
Implantable Lead Implant Date: 20240304
Implantable Lead Location: 753859
Implantable Lead Location: 753860
Implantable Pulse Generator Implant Date: 20240304
Lead Channel Impedance Value: 425 Ohm
Lead Channel Impedance Value: 437.5 Ohm
Lead Channel Sensing Intrinsic Amplitude: 12 mV
Lead Channel Sensing Intrinsic Amplitude: 2.1 mV
Lead Channel Setting Pacing Amplitude: 2.5 V
Lead Channel Setting Pacing Pulse Width: 0.5 ms
Lead Channel Setting Sensing Sensitivity: 2 mV
Pulse Gen Model: 2272
Pulse Gen Serial Number: 8153879

## 2023-02-09 LAB — TROPONIN T: Troponin T (Highly Sensitive): 31 ng/L

## 2023-02-09 LAB — CBC WITH DIFFERENTIAL/PLATELET
Basophils Absolute: 0 10*3/uL
Basos: 0 %
EOS (ABSOLUTE): 0.1 10*3/uL
Eos: 1 %
Hematocrit: 41.2 %
Hemoglobin: 14.1 g/dL
Lymphocytes Absolute: 2.1 10*3/uL
Lymphs: 23 %
MCH: 33 pg
MCHC: 34.2 g/dL
MCV: 97 fL
Monocytes Absolute: 0.9 10*3/uL
Monocytes: 10 %
Neutrophils Absolute: 6.1 10*3/uL
Neutrophils: 66 %
Platelets: 270 10*3/uL (ref 150–450)
RBC: 4.27 x10E6/uL
RDW: 14.7 %
WBC: 9.2 10*3/uL (ref 3.4–10.8)

## 2023-02-09 LAB — BASIC METABOLIC PANEL
BUN/Creatinine Ratio: 19
BUN: 21 mg/dL
CO2: 32 mmol/L — ABNORMAL HIGH (ref 20–29)
Calcium: 10.2 mg/dL
Chloride: 92 mmol/L — ABNORMAL LOW (ref 96–106)
Creatinine, Ser: 1.1 mg/dL
Glucose: 170 mg/dL — ABNORMAL HIGH (ref 70–99)
Potassium: 4 mmol/L (ref 3.5–5.2)
Sodium: 134 mmol/L (ref 134–144)

## 2023-02-09 LAB — D-DIMER, QUANTITATIVE: D-DIMER: 0.49 mg{FEU}/L (ref 0.00–0.49)

## 2023-02-09 NOTE — Patient Instructions (Signed)
Medication Instructions:  Your physician has recommended you make the following change in your medication:  1-Discontinue Valsartan HCTZ  *If you need a refill on your cardiac medications before your next appointment, please call your pharmacy*   Lab Work: Your physician recommends that you have STAT lab work today- BMET, CBC, Troponin  Your physician recommends that you have lab work today- D-DIMER, TSH  If you have labs (blood work) drawn today and your tests are completely normal, you will receive your results only by: MyChart Message (if you have MyChart) OR A paper copy in the mail If you have any lab test that is abnormal or we need to change your treatment, we will call you to review the results.   Testing/Procedures: Your physician has requested that you have an echocardiogram as soon as possible. Echocardiography is a painless test that uses sound waves to create images of your heart. It provides your doctor with information about the size and shape of your heart and how well your heart's chambers and valves are working. This procedure takes approximately one hour. There are no restrictions for this procedure. Please do NOT wear cologne, perfume, aftershave, or lotions (deodorant is allowed). Please arrive 15 minutes prior to your appointment time.  Non-Cardiac CT scanning of the head, STAT (CAT scanning), is a noninvasive, special x-ray that produces cross-sectional images of the body using x-rays and a computer. CT scans help physicians diagnose and treat medical conditions. For some CT exams, a contrast material is used to enhance visibility in the area of the body being studied. CT scans provide greater clarity and reveal more details than regular x-ray exams.   Follow-Up: At [facility], you and your health needs are our priority.  As part of our continuing mission to provide you with exceptional heart care, we have created designated Provider Care Teams.  These  Care Teams include your primary Cardiologist (physician) and Advanced Practice Providers (APPs -  Physician Assistants and Nurse Practitioners) who all work together to provide you with the care you need, when you need it.  We recommend signing up for the patient portal called "MyChart".  Sign up information is provided on this After Visit Summary.  MyChart is used to connect with patients for Virtual Visits (Telemedicine).  Patients are able to view lab/test results, encounter notes, upcoming appointments, etc.  Non-urgent messages can be sent to your provider as well.   To learn more about what you can do with MyChart, go to [email or web address].    Your next appointment:   4 week(s)  Provider:   Thomasene Ripple, DO  or Theodore Demark, PA-C, Edd Fabian, FNP, Micah Flesher, PA-C, Marjie Skiff, PA-C, Robet Leu, PA-C, Juanda Crumble, PA-C, Joni Reining, DNP, ANP, Azalee Course, PA-C, Bernadene Person, NP, Perlie Gold, PA-C, or Carlos Levering, NP     Your physician recommends that you schedule a follow-up appointment next available with EP APPs  Other Instructions No driving until you are cleared with your cardiology team.

## 2023-02-09 NOTE — Progress Notes (Signed)
Patient brought in due to RA undersensing.  Device interrogation performed and troubleshooting with assistance from [employer]. [employer] rep).  Atrial undersensing of AFIB at 0.2 (auto) in bipolar configuration, same occurring at 0.5 in unipolar.  Paitent has been in persistent AF since March.  Patient's underlying today shows AFIB, intrinsic r waves 58-72.   Scheduled remote monitoring check's to re-assess every 2 weeks for the next month.  Patient given device clinic number to call if any concerns.   Patient VP approx 59% of the time.     PROGRAMMING CHANGE TODAY: 1.  Changed DDD mode to VVI at 60bpm.

## 2023-02-10 ENCOUNTER — Ambulatory Visit (HOSPITAL_COMMUNITY)
Admission: RE | Admit: 2023-02-10 | Discharge: 2023-02-10 | Disposition: A | Discharge status: Home / Self Care | Payer: BC Managed Care – PPO | Source: Ambulatory Visit | Attending: Physician Assistant | Admitting: Physician Assistant

## 2023-02-10 ENCOUNTER — Telehealth: Payer: Self-pay | Admitting: Physician Assistant

## 2023-02-10 DIAGNOSIS — I4819 Other persistent atrial fibrillation: Secondary | ICD-10-CM | POA: Insufficient documentation

## 2023-02-10 DIAGNOSIS — I4892 Unspecified atrial flutter: Secondary | ICD-10-CM | POA: Insufficient documentation

## 2023-02-10 DIAGNOSIS — I517 Cardiomegaly: Secondary | ICD-10-CM | POA: Insufficient documentation

## 2023-02-10 DIAGNOSIS — S0232XA Fracture of orbital floor, left side, initial encounter for closed fracture: Secondary | ICD-10-CM | POA: Diagnosis not present

## 2023-02-10 DIAGNOSIS — D6861 Antiphospholipid syndrome: Secondary | ICD-10-CM | POA: Insufficient documentation

## 2023-02-10 DIAGNOSIS — R55 Syncope and collapse: Secondary | ICD-10-CM | POA: Diagnosis not present

## 2023-02-10 DIAGNOSIS — I2510 Atherosclerotic heart disease of native coronary artery without angina pectoris: Secondary | ICD-10-CM | POA: Diagnosis not present

## 2023-02-10 LAB — TSH: TSH: 1.03 u[IU]/mL (ref 0.450–4.500)

## 2023-02-10 MED ORDER — SPIRONOLACTONE 25 MG PO TABS: 12.5000 mg | ORAL_TABLET | Freq: Every day | ORAL | Status: DC

## 2023-02-10 NOTE — Telephone Encounter (Signed)
Discussed lab and CT results with patient and Ronie Spies, PA-C's recommendations.  See result notes for details.  Medication list updated.  MyChart message sent to patient as requested.

## 2023-02-12 ENCOUNTER — Ambulatory Visit (HOSPITAL_COMMUNITY): Payer: BC Managed Care – PPO | Attending: Physician Assistant

## 2023-02-12 DIAGNOSIS — I2510 Atherosclerotic heart disease of native coronary artery without angina pectoris: Secondary | ICD-10-CM

## 2023-02-12 DIAGNOSIS — I517 Cardiomegaly: Secondary | ICD-10-CM | POA: Insufficient documentation

## 2023-02-12 DIAGNOSIS — I4819 Other persistent atrial fibrillation: Secondary | ICD-10-CM | POA: Diagnosis not present

## 2023-02-12 DIAGNOSIS — D6861 Antiphospholipid syndrome: Secondary | ICD-10-CM | POA: Diagnosis not present

## 2023-02-12 DIAGNOSIS — I4892 Unspecified atrial flutter: Secondary | ICD-10-CM | POA: Diagnosis not present

## 2023-02-12 DIAGNOSIS — R55 Syncope and collapse: Secondary | ICD-10-CM | POA: Diagnosis not present

## 2023-02-12 LAB — ECHOCARDIOGRAM COMPLETE: S' Lateral: 3.7 cm

## 2023-02-12 MED ORDER — PERFLUTREN LIPID MICROSPHERE
1.0000 mL | INTRAVENOUS | Status: AC | PRN
Start: 2023-02-12 — End: 2023-02-12
  Administered 2023-02-12: 2 mL via INTRAVENOUS

## 2023-02-14 DIAGNOSIS — Z419 Encounter for procedure for purposes other than remedying health state, unspecified: Secondary | ICD-10-CM | POA: Diagnosis not present

## 2023-02-16 ENCOUNTER — Ambulatory Visit (INDEPENDENT_AMBULATORY_CARE_PROVIDER_SITE_OTHER): Payer: BC Managed Care – PPO

## 2023-02-16 DIAGNOSIS — I442 Atrioventricular block, complete: Secondary | ICD-10-CM

## 2023-02-16 DIAGNOSIS — R55 Syncope and collapse: Secondary | ICD-10-CM

## 2023-02-16 LAB — CUP PACEART REMOTE DEVICE CHECK
Battery Remaining Longevity: 119 mo
Battery Remaining Percentage: 95.5 %
Battery Voltage: 2.98 V
Brady Statistic RV Percent Paced: 35 %
Date Time Interrogation Session: 20240902020024
Implantable Lead Connection Status: 753985
Implantable Lead Connection Status: 753985
Implantable Lead Implant Date: 20240304
Implantable Lead Implant Date: 20240304
Implantable Lead Location: 753859
Implantable Lead Location: 753860
Implantable Pulse Generator Implant Date: 20240304
Lead Channel Impedance Value: 430 Ohm
Lead Channel Pacing Threshold Amplitude: 1 V
Lead Channel Pacing Threshold Pulse Width: 0.5 ms
Lead Channel Sensing Intrinsic Amplitude: 12 mV
Lead Channel Setting Pacing Amplitude: 2.5 V
Lead Channel Setting Pacing Pulse Width: 0.5 ms
Lead Channel Setting Sensing Sensitivity: 2 mV
Pulse Gen Model: 2272
Pulse Gen Serial Number: 8153879

## 2023-02-17 ENCOUNTER — Telehealth: Payer: Self-pay | Admitting: *Deleted

## 2023-02-17 DIAGNOSIS — R55 Syncope and collapse: Secondary | ICD-10-CM

## 2023-02-17 DIAGNOSIS — I517 Cardiomegaly: Secondary | ICD-10-CM

## 2023-02-17 NOTE — Telephone Encounter (Signed)
-----   Message from Laurann Montana sent at 02/16/2023  4:50 PM EDT ----- Dr. Ladona Ridgel replied: "Agree with MRI. He has a MRI compatible PPM. GT" See previous result notes about getting this ordered. Thank you!

## 2023-02-17 NOTE — Telephone Encounter (Signed)
Call placed to patient but there was no answer and no voicemail.  Message and instructions sent to patient through my chart message

## 2023-02-24 NOTE — Progress Notes (Signed)
Remote pacemaker transmission.   

## 2023-02-25 DIAGNOSIS — R55 Syncope and collapse: Secondary | ICD-10-CM | POA: Insufficient documentation

## 2023-02-25 NOTE — Progress Notes (Signed)
Cardiology Office Note:    Date:  02/26/2023  ID:  Christopher Green, DOB 02-11-1971, MRN [id number] PCP: Sandre Kitty, MD  [facility] Providers Cardiologist:  Thomasene Ripple, DO Electrophysiologist:  Lewayne Bunting, MD       Patient Profile:      Coronary artery disease, nonobstructive LHC 03/25/2018 ([facility]): Proximal LAD 20, mid LAD 20, Dx 20, proximal LCx 20, proximal RCA 20 High grade heart block s/p pacemaker  Atrial tachycardia Atrial flutter/?atrial fibrillation  Syncope TTE 02/12/2023: EF 60-65, no RWMA, severe LVH, normal RVSF, trivial MR, mild AI, AV sclerosis, RAP 3 Hypertension  Hyperlipidemia  Diabetes mellitus  Tobacco use ETOH abuse  Hx of DVT/pulmonary embolism in 05/2022  Antiphospholipid antibody syndrome - chronic anticoagulation          History of Present Illness:  Discussed the use of AI scribe software for clinical note transcription with the patient, who gave verbal consent to proceed.     Christopher Green is a 52 y.o. male who returns for follow up on syncope. He was last seen by Ronie Spies, PA-C 02/09/23 for syncope. He had an episode in 11/2022 resulting in facial fractures with orbital blowout fx.When he saw Dayna, he noted several episodes of syncope occurring several times a week. He recorded some low BPs (systolic 51 and 70). He stopped his Valsartan/hydrochlorothiazide and had not had any further episodes. Device interrogation demonstrated AFib/Flutter; no cause for syncope. Ortho VS were + in clinic (152/86 lying >> 116/73 standing). HR actually dropped (72>>53). He had not symptoms with this. Rec was to go to the ED for head CT due to multiple falls and hitting his head. The patient declined to go to the ED. DDimer was neg. hsTroponin was 31. Head CT was neg for bleed. TSH was normal.  Echocardiogram demonstrated severe LVH and normal EF.  Cardiac MRI has been ordered and is pending.  Pacer RA lead was noted to be under sensing.  He was seen  by the device clinic and changed DDD mode to VVI at 60.  EP follow-up is planned for next month.   He is here alone today. He notes he has not had further syncope since stopping valsartan/hydrochlorothiazide. The patient reports chronic back pain, which limits his physical activity, and he has neuropathic foot pain, which is constant and severe. The patient also reports exertional shortness of breath, which has been ongoing for an unspecified duration. He has not had orthopnea, edema.  ROS:  See HPI No melena, hematochezia.    Studies Reviewed:        Risk Assessment/Calculations:    CHA2DS2-VASc Score = 5   This indicates a 7.2% annual risk of stroke. The patient's score is based upon: CHF History: 0 HTN History: 1 Diabetes History: 1 Stroke History: 2 Vascular Disease History: 1 Age Score: 0 Gender Score: 0    HYPERTENSION CONTROL Vitals:   02/26/23 0914 02/26/23 0951  BP: (!) 140/78 (!) 150/90    The patient's blood pressure is elevated above target today.  In order to address the patient's elevated BP: Blood pressure will be monitored at home to determine if medication changes need to be made.          Physical Exam:   VS:  BP (!) 150/90   Pulse 64   Ht 6\' 1"  (1.854 m)   Wt 292 lb 12.8 oz (132.8 kg)   SpO2 96%   BMI 38.63 kg/m  Wt Readings from Last 3 Encounters:  02/26/23 292 lb 12.8 oz (132.8 kg)  02/09/23 298 lb 9.6 oz (135.4 kg)  02/02/23 275 lb (124.7 kg)    Constitutional:      Appearance: Healthy appearance. Not in distress.  Neck:     Vascular: No JVR. JVD normal.  Pulmonary:     Breath sounds: Normal breath sounds. No wheezing. No rales.  Cardiovascular:     Normal rate. Irregularly irregular rhythm.     Murmurs: There is no murmur.  Edema:    Peripheral edema absent.  Abdominal:     Palpations: Abdomen is soft.      Assessment and Plan:     Syncope No recurrence since discontinuation of Valsartan/Hydrochlorothiazide. Likely related  to hypotension. -No further testing needed.  Hypertension Blood pressure running high since discontinuation of Valsartan/Hydrochlorothiazide.  However, he has not had any medications yet today. -Continue Carvedilol 3.125mg  twice daily, Hydralazine 100mg  twice daily, Spironolactone 12.5mg  daily. -Monitor blood pressure over the next week and send readings for review via MyChart. -If blood pressure remains above target, increase Carvedilol to 6.25mg  twice daily.  Atrial Fibrillation/Flutter Noted on pacer interrogation.  He has been in persistent atrial fibrillation since March 2024. -Continue Xarelto 20mg  daily and Carvedilol 3.125mg  twice daily. -Follow up with Electrophysiology as planned. -I will review with Dr. Ladona Ridgel see if we should consider cardioversion  Coronary Artery Disease Non-obstructive as per cardiac catheterization in 2019. No symptoms of chest pain to suggest angina. -Continue Lipitor 40mg  daily. -He is not on antiplatelet therapy as he is on Xarelto.  Hyperlipidemia -Continue Lipitor 40mg  daily.  Hypercoagulable State (History of DVT, Pulmonary Embolism, and Antiphospholipid Antibody Syndrome) On long-term anticoagulation with Xarelto 20mg  daily. -Continue current management.  Left Ventricular Hypertrophy Severe LVH noted on echocardiogram. Mild aortic insufficiency. -Cardiac MRI is currently pending to further evaluate.  Tobacco Use -Recommended cessation.  High Grade Heart Block Status post pacemaker. -Follow up with Electrophysiology as planned.         Dispo:  Return in about 3 months (around 05/28/2023) for Routine Follow Up with Dr. Servando Salina, or Tereso Newcomer, PA-C.  Signed, Tereso Newcomer, PA-C

## 2023-02-26 ENCOUNTER — Ambulatory Visit: Payer: BC Managed Care – PPO | Attending: Physician Assistant | Admitting: Physician Assistant

## 2023-02-26 ENCOUNTER — Encounter: Payer: Self-pay | Admitting: Physician Assistant

## 2023-02-26 VITALS — BP 150/90 | HR 64 | Ht 73.0 in | Wt 292.8 lb

## 2023-02-26 DIAGNOSIS — I4819 Other persistent atrial fibrillation: Secondary | ICD-10-CM | POA: Diagnosis not present

## 2023-02-26 DIAGNOSIS — I10 Essential (primary) hypertension: Secondary | ICD-10-CM | POA: Diagnosis not present

## 2023-02-26 DIAGNOSIS — I441 Atrioventricular block, second degree: Secondary | ICD-10-CM

## 2023-02-26 DIAGNOSIS — I2510 Atherosclerotic heart disease of native coronary artery without angina pectoris: Secondary | ICD-10-CM | POA: Diagnosis not present

## 2023-02-26 DIAGNOSIS — R55 Syncope and collapse: Secondary | ICD-10-CM | POA: Diagnosis not present

## 2023-02-26 DIAGNOSIS — Z720 Tobacco use: Secondary | ICD-10-CM

## 2023-02-26 DIAGNOSIS — I517 Cardiomegaly: Secondary | ICD-10-CM | POA: Insufficient documentation

## 2023-02-26 DIAGNOSIS — E785 Hyperlipidemia, unspecified: Secondary | ICD-10-CM

## 2023-02-26 NOTE — Patient Instructions (Signed)
Medication Instructions:  Your physician recommends that you continue on your current medications as directed. Please refer to the Current Medication list given to you today.  *If you need a refill on your cardiac medications before your next appointment, please call your pharmacy*   Lab Work: None ordered  If you have labs (blood work) drawn today and your tests are completely normal, you will receive your results only by: MyChart Message (if you have MyChart) OR A paper copy in the mail If you have any lab test that is abnormal or we need to change your treatment, we will call you to review the results.   Testing/Procedures: None ordered   Follow-Up: At [facility], you and your health needs are our priority.  As part of our continuing mission to provide you with exceptional heart care, we have created designated Provider Care Teams.  These Care Teams include your primary Cardiologist (physician) and Advanced Practice Providers (APPs -  Physician Assistants and Nurse Practitioners) who all work together to provide you with the care you need, when you need it.  We recommend signing up for the patient portal called "MyChart".  Sign up information is provided on this After Visit Summary.  MyChart is used to connect with patients for Virtual Visits (Telemedicine).  Patients are able to view lab/test results, encounter notes, upcoming appointments, etc.  Non-urgent messages can be sent to your provider as well.   To learn more about what you can do with MyChart, go to [email or web address].    Your next appointment:   3 month(s)  Provider:   Thomasene Ripple, DO     Other Instructions Your physician has requested that you regularly monitor and record your blood pressure readings at home. Please use the same machine at the same time of day to check your readings and record them to bring to your follow-up visit.   Please monitor blood pressures and keep a log of your readings for  1 week then send them via mychart.    Make sure to check 2 hours after your medications.    AVOID these things for 30 minutes before checking your blood pressure: No Drinking caffeine. No Drinking alcohol. No Eating. No Smoking. No Exercising.   Five minutes before checking your blood pressure: Pee. Sit in a dining chair. Avoid sitting in a soft couch or armchair. Be quiet. Do not talk

## 2023-03-02 ENCOUNTER — Ambulatory Visit (INDEPENDENT_AMBULATORY_CARE_PROVIDER_SITE_OTHER): Payer: BC Managed Care – PPO

## 2023-03-02 DIAGNOSIS — I4819 Other persistent atrial fibrillation: Secondary | ICD-10-CM

## 2023-03-02 DIAGNOSIS — I442 Atrioventricular block, complete: Secondary | ICD-10-CM

## 2023-03-02 LAB — CUP PACEART REMOTE DEVICE CHECK
Battery Remaining Longevity: 116 mo
Battery Remaining Percentage: 95 %
Battery Voltage: 2.98 V
Brady Statistic RV Percent Paced: 52 %
Date Time Interrogation Session: 20240917040013
Implantable Lead Connection Status: 753985
Implantable Lead Connection Status: 753985
Implantable Lead Implant Date: 20240304
Implantable Lead Implant Date: 20240304
Implantable Lead Location: 753859
Implantable Lead Location: 753860
Implantable Pulse Generator Implant Date: 20240304
Lead Channel Impedance Value: 430 Ohm
Lead Channel Pacing Threshold Amplitude: 1 V
Lead Channel Pacing Threshold Pulse Width: 0.5 ms
Lead Channel Sensing Intrinsic Amplitude: 12 mV
Lead Channel Setting Pacing Amplitude: 2.5 V
Lead Channel Setting Pacing Pulse Width: 0.5 ms
Lead Channel Setting Sensing Sensitivity: 2 mV
Pulse Gen Model: 2272
Pulse Gen Serial Number: 8153879

## 2023-03-05 ENCOUNTER — Other Ambulatory Visit: Payer: Self-pay | Admitting: Family Medicine

## 2023-03-05 ENCOUNTER — Telehealth: Payer: Self-pay

## 2023-03-05 MED ORDER — OXYCODONE-ACETAMINOPHEN 10-325 MG PO TABS: ORAL_TABLET | ORAL | 0 refills | Status: DC

## 2023-03-05 NOTE — Telephone Encounter (Signed)
Prescription Request  03/05/2023  LOV: 02/01/23  What is the name of the medication or equipment?   oxyCODONE-acetaminophen (PERCOCET) 10-325 MG tablet    Have you contacted your pharmacy to request a refill? No   Which pharmacy would you like this sent to?   [id number] - [place], [place] - [address] [place] [address] Phone: [phone] Fax: [phone]    Patient notified that their request is being sent to the clinical staff for review and that they should receive a response within 2 business days.   Please advise at Mobile [phone] (mobile)

## 2023-03-09 ENCOUNTER — Encounter: Payer: Self-pay | Admitting: Family Medicine

## 2023-03-09 ENCOUNTER — Ambulatory Visit (INDEPENDENT_AMBULATORY_CARE_PROVIDER_SITE_OTHER): Payer: BC Managed Care – PPO | Admitting: Family Medicine

## 2023-03-09 VITALS — BP 137/66 | HR 66 | Ht 73.0 in | Wt 299.8 lb

## 2023-03-09 DIAGNOSIS — E291 Testicular hypofunction: Secondary | ICD-10-CM | POA: Diagnosis not present

## 2023-03-09 DIAGNOSIS — M5442 Lumbago with sciatica, left side: Secondary | ICD-10-CM | POA: Diagnosis not present

## 2023-03-09 DIAGNOSIS — M5441 Lumbago with sciatica, right side: Secondary | ICD-10-CM

## 2023-03-09 DIAGNOSIS — R55 Syncope and collapse: Secondary | ICD-10-CM

## 2023-03-09 DIAGNOSIS — E1169 Type 2 diabetes mellitus with other specified complication: Secondary | ICD-10-CM | POA: Diagnosis not present

## 2023-03-09 DIAGNOSIS — G8929 Other chronic pain: Secondary | ICD-10-CM

## 2023-03-09 MED ORDER — SEMAGLUTIDE (1 MG/DOSE) 4 MG/3ML ~~LOC~~ SOPN: 1.0000 mg | PEN_INJECTOR | SUBCUTANEOUS | 3 refills | Status: DC

## 2023-03-09 NOTE — Assessment & Plan Note (Addendum)
Taking 0.5 mg Ozempic.  Discussed increasing it to 1 mg.  Patient okay with increasing it.  Sometimes has early satiety with meals but no vomiting or abdominal pain.  Follow-up in 2 months for recheck of A1c.

## 2023-03-09 NOTE — Assessment & Plan Note (Signed)
Since the last time I saw the patient he has seen his cardiologist.  At his visit with the cardiologist he mentioned having low blood pressures associated with his syncopal episodes.  Was advised to stop his blood pressure medications.  Since that time he has not had any more syncopal episodes.  Home blood pressure readings are reported to be in the 140s systolic.  Blood pressure was less than 140 systolic today.  Denies hypotensive readings on home checks since stopping medication.  Continue to hold blood pressure medications.  Still taking carvedilol.

## 2023-03-09 NOTE — Assessment & Plan Note (Signed)
Has had appointment with neurosurgeon.  Has a lumbar MRI scheduled for late October.  Patient compliant with his pain medication.  Recently refilled his oxycodone.

## 2023-03-09 NOTE — Patient Instructions (Addendum)
It was nice to see you today,  We addressed the following topics today: -We will check your testosterone level today since it has been 1 week since the last time you administered it - I will send in a new Ozempic dose of 1 mg.  You will take this instead of the 0.5 mg.  Continue taking it once a week. - Follow-up with [place] in 2 months to recheck your A1c.   Have a great day,  Frederic Jericho, MD

## 2023-03-09 NOTE — Progress Notes (Signed)
Established Patient Office Visit  Subjective   Patient ID: Christopher Green, male    DOB: July 23, 1970  Age: 52 y.o. MRN: [id number]  Chief Complaint  Patient presents with   Medical Management of Chronic Issues    HPI  Syncope-patient still has not had any episodes of syncope since starting his blood pressure medications.  Blood pressure was better controlled today.  States at home it is typically in the 140s systolic.  No questions or concerns regarding this.  DM2-patient is taking his Ozempic at 0.5 mg dosing.  We discussed increasing it to 1 mg dosing.  Patient okay with this.  Will send in 2 [place] drug.  Patient has an MRI of his heart and back scheduled on 26 October.  Testosterone-patient had his last testosterone injection 1 week ago.  Does it every other week.  He is fine with getting it rechecked today.  We discussed continuing to use it as directed.   The ASCVD Risk score (Arnett DK, et al., 2019) failed to calculate for the following reasons:   Cannot find a previous HDL lab   Cannot find a previous total cholesterol lab  Health Maintenance Due  Topic Date Due   COVID-19 Vaccine (1) Never done   OPHTHALMOLOGY EXAM  Never done   Diabetic kidney evaluation - Urine ACR  Never done   Hepatitis C Screening  Never done   DTaP/Tdap/Td (1 - Tdap) Never done   Colonoscopy  Never done   INFLUENZA VACCINE  Never done      Objective:     BP 137/66   Pulse 66   Ht 6\' 1"  (1.854 m)   Wt 299 lb 12.8 oz (136 kg)   SpO2 95%   BMI 39.55 kg/m    Physical Exam General: Alert, oriented CV: Regular rhythm Pulmonary: Lungs clear bilaterally   No results found for any visits on 03/09/23.      Assessment & Plan:   Hypogonadism in male Assessment & Plan: Last dose was 1 week ago.  Taking it every other week.  Will recheck testosterone level today.  Orders: -     Testosterone  Syncope, unspecified syncope type Assessment & Plan: Since the last time I saw the  patient he has seen his cardiologist.  At his visit with the cardiologist he mentioned having low blood pressures associated with his syncopal episodes.  Was advised to stop his blood pressure medications.  Since that time he has not had any more syncopal episodes.  Home blood pressure readings are reported to be in the 140s systolic.  Blood pressure was less than 140 systolic today.  Denies hypotensive readings on home checks since stopping medication.  Continue to hold blood pressure medications.  Still taking carvedilol.   Chronic bilateral low back pain with bilateral sciatica Assessment & Plan: Has had appointment with neurosurgeon.  Has a lumbar MRI scheduled for late October.  Patient compliant with his pain medication.  Recently refilled his oxycodone.   Type 2 diabetes mellitus with other specified complication, without long-term current use of insulin (HCC) Assessment & Plan: Taking 0.5 mg Ozempic.  Discussed increasing it to 1 mg.  Patient okay with increasing it.  Sometimes has early satiety with meals but no vomiting or abdominal pain.  Follow-up in 2 months for recheck of A1c.   Other orders -     Semaglutide (1 MG/DOSE); Inject 1 mg as directed once a week.  Dispense: 3 mL; Refill: 3  Return in about 2 months (around 05/09/2023) for DM.    Sandre Kitty, MD

## 2023-03-09 NOTE — Assessment & Plan Note (Signed)
Last dose was 1 week ago.  Taking it every other week.  Will recheck testosterone level today.

## 2023-03-10 LAB — TESTOSTERONE: Testosterone: 1363 ng/dL — ABNORMAL HIGH (ref 264–916)

## 2023-03-11 ENCOUNTER — Other Ambulatory Visit: Payer: Self-pay | Admitting: Family Medicine

## 2023-03-16 DIAGNOSIS — Z419 Encounter for procedure for purposes other than remedying health state, unspecified: Secondary | ICD-10-CM | POA: Diagnosis not present

## 2023-03-17 NOTE — Progress Notes (Signed)
Remote pacemaker transmission.   

## 2023-03-22 NOTE — Progress Notes (Deleted)
Cardiology Office Note:  .   Date:  03/22/2023  ID:  Christopher Green, DOB 10-12-1970, MRN [id number] PCP: Sandre Kitty, MD  [facility] Providers Cardiologist:  Thomasene Ripple, DO Electrophysiologist:  Lewayne Bunting, MD {  History of Present Illness: .   Christopher Green is a 52 y.o. 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 Ladona Ridgel 11/24/22, doing well, no changes were made.  He saw D. Dunn, PA 02/09/23 for recurrent syncope, suffered serious facial injuries, suspect to have been situational, perhaps cough at the hospital/ER. Pt described too many episodes to recall, no clear pattern, trigger by the note other then low BPs perhaps.he self stopped valsartan, hydrochlorothiazide and reported no additional events since then. Device interrogation noted no findings to explain syncope, AFib CVR, A lead undersensing "not new for him" Orthostatics + (mild, asymptomatic) Advised to the ER given so many events and being on OAC, though he declined, planned for labs Suspected low BP Labs suggested dehydration, advised to stay off valsartan/hydrochlorothiazide and his spironolactone dose reduced. Discussed f/u w/EP to visit rhythm control options Also discussed concerns of ongoing a/c in the environment of recurrent syncope.   He saw Wende Mott PA 02/26/23, reported no ongoing symptoms or syncope, and was likely low BP, pressure this visit was elevated though had not taken his meds yet.  Planned to monitor at home   Today's visit is scheduled as a 4 week visit  ROS: ***  *** syncope? *** orthostatics *** xarelto, bleeding, labs, dose *** burden, symptoms, >>> if no ongoing syncope or concerns of need for interruption >> DCCV?   *** AAD options *** LVH??  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 Left ventricular ejection fraction, by estimation, is 60 to 65%. The  left ventricle has normal function. The left ventricle has no regional  wall motion abnormalities. There is severe concentric left ventricular  hypertrophy. Left ventricular diastolic   parameters are indeterminate.   2. Right ventricular systolic function is normal. The right ventricular  size is normal.   3. The mitral valve is normal in structure. Trivial mitral valve  regurgitation. No evidence of mitral stenosis.   4. The aortic valve is tricuspid. Aortic valve regurgitation is mild.  Aortic valve sclerosis is present, with no evidence of aortic valve  stenosis.   5. The inferior vena cava is normal in size with greater than 50%  respiratory variability, suggesting right atrial pressure of 3 mmHg.   Risk Assessment/Calculations:    Physical Exam:   VS:  There were no vitals taken for this visit.   Wt Readings from Last 3 Encounters:  03/09/23 299 lb 12.8 oz (136 kg)  02/26/23 292 lb 12.8 oz (132.8 kg)  02/09/23 298 lb 9.6 oz (135.4 kg)    GEN: Well nourished, well developed in no acute distress NECK: No JVD; No carotid bruits CARDIAC: ***RRR, no murmurs, rubs, gallops RESPIRATORY:  *** CTA b/l without rales, wheezing or rhonchi  ABDOMEN: Soft, non-tender,  non-distended EXTREMITIES:  No edema; No deformity   PPM/ICD/ILR site: *** is stable, no thinning, fluctuation, tethering  ASSESSMENT AND PLAN: .    persistent AFib CHA2DS2Vasc is 4, on Xarelto, *** appropriately dosed *** % burden  PPM ***   Syncope ***  HTN Severe conc LVH Relative hypotension w/meds ***  ***  Secondary  hypercoagulable state 2/2 AFib, DVT, Pulmonary Embolism, and Antiphospholipid Antibody Syndrome      {Are you ordering a CV Procedure (e.g. stress test, cath, DCCV, TEE, etc)?   Press F2        :[id number]}     Dispo: ***  Signed, Sheilah Pigeon, PA-C

## 2023-03-24 ENCOUNTER — Ambulatory Visit: Payer: BC Managed Care – PPO | Admitting: Physician Assistant

## 2023-04-01 ENCOUNTER — Other Ambulatory Visit: Payer: Self-pay | Admitting: Family Medicine

## 2023-04-05 ENCOUNTER — Telehealth: Payer: Self-pay | Admitting: *Deleted

## 2023-04-05 ENCOUNTER — Other Ambulatory Visit: Payer: Self-pay | Admitting: Family Medicine

## 2023-04-05 MED ORDER — OXYCODONE-ACETAMINOPHEN 10-325 MG PO TABS: ORAL_TABLET | ORAL | 0 refills | Status: DC

## 2023-04-05 NOTE — Telephone Encounter (Signed)
Refill sent in

## 2023-04-05 NOTE — Telephone Encounter (Signed)
Prescription Request  04/05/2023  LOV: 03/09/23  What is the name of the medication or equipment?   oxyCODONE-acetaminophen (PERCOCET) 10-325 MG tablet   Have you contacted your pharmacy to request a refill? No   Which pharmacy would you like this sent to?    [id number] - [place], [place] - [address] [place] [address] Phone: [phone] Fax: [phone]    Patient notified that their request is being sent to the clinical staff for review and that they should receive a response within 2 business days.   Please advise at Mobile [phone] (mobile)

## 2023-04-09 ENCOUNTER — Telehealth (HOSPITAL_COMMUNITY): Payer: Self-pay | Admitting: *Deleted

## 2023-04-09 MED ORDER — LORAZEPAM 1 MG PO TABS: 1.0000 mg | ORAL_TABLET | Freq: Once | ORAL | 0 refills | Status: AC

## 2023-04-09 NOTE — Telephone Encounter (Signed)
Reaching out to patient to offer assistance regarding upcoming cardiac imaging study; pt verbalizes understanding of appt date/time, parking situation and where to check in, pre-test NPO status and medications ordered, and verified current allergies; name and call back number provided for further questions should they arise Johney Frame RN Navigator Cardiac Imaging [facility] [phone] office [phone] cell   Patient denies metal (has PPM).  Reports claustrophobia.   Premeds called into pharmacy per Dr. Servando Salina.   Patient aware he will need a driver.

## 2023-04-12 ENCOUNTER — Ambulatory Visit (HOSPITAL_COMMUNITY)
Admission: RE | Admit: 2023-04-12 | Discharge: 2023-04-12 | Disposition: A | Discharge status: Home / Self Care | Payer: BC Managed Care – PPO | Source: Ambulatory Visit | Attending: Physician Assistant | Admitting: Physician Assistant

## 2023-04-12 ENCOUNTER — Other Ambulatory Visit: Payer: Self-pay | Admitting: Physician Assistant

## 2023-04-12 ENCOUNTER — Ambulatory Visit (HOSPITAL_COMMUNITY)
Admission: RE | Admit: 2023-04-12 | Discharge: 2023-04-12 | Disposition: A | Discharge status: Home / Self Care | Payer: BC Managed Care – PPO | Source: Ambulatory Visit | Attending: Orthopedic Surgery | Admitting: Orthopedic Surgery

## 2023-04-12 DIAGNOSIS — M5116 Intervertebral disc disorders with radiculopathy, lumbar region: Secondary | ICD-10-CM | POA: Diagnosis not present

## 2023-04-12 DIAGNOSIS — R55 Syncope and collapse: Secondary | ICD-10-CM

## 2023-04-12 DIAGNOSIS — M48061 Spinal stenosis, lumbar region without neurogenic claudication: Secondary | ICD-10-CM | POA: Insufficient documentation

## 2023-04-12 DIAGNOSIS — M4726 Other spondylosis with radiculopathy, lumbar region: Secondary | ICD-10-CM | POA: Diagnosis not present

## 2023-04-12 DIAGNOSIS — M5416 Radiculopathy, lumbar region: Secondary | ICD-10-CM | POA: Diagnosis not present

## 2023-04-12 DIAGNOSIS — I517 Cardiomegaly: Secondary | ICD-10-CM | POA: Insufficient documentation

## 2023-04-12 DIAGNOSIS — M5117 Intervertebral disc disorders with radiculopathy, lumbosacral region: Secondary | ICD-10-CM | POA: Diagnosis not present

## 2023-04-12 DIAGNOSIS — M47816 Spondylosis without myelopathy or radiculopathy, lumbar region: Secondary | ICD-10-CM | POA: Diagnosis not present

## 2023-04-12 MED ORDER — GADOBUTROL 1 MMOL/ML IV SOLN
10.0000 mL | Freq: Once | INTRAVENOUS | Status: AC | PRN
Start: 2023-04-12 — End: 2023-04-12
  Administered 2023-04-12: 10 mL via INTRAVENOUS

## 2023-04-13 ENCOUNTER — Telehealth: Payer: Self-pay

## 2023-04-13 DIAGNOSIS — D8685 Sarcoid myocarditis: Secondary | ICD-10-CM

## 2023-04-13 NOTE — Telephone Encounter (Signed)
-----   Message from Laurann Montana sent at 04/12/2023  4:27 PM EDT ----- Please let pt know I discussed MRI report with the doctor that read the study. This shows severe thickening of the heart as we saw on echo, with appearance of whether this could be a process of inflammation of the heart called sarcoidosis. Sarcoidosis can also cause heart rhythm issues leading to pacemakers which may link these in his medical history. The EF pump function was down slightly on this study but this may be due to him breathing during the study - echo recently had shown that EF was normal. Dr. Izora Ribas would recommend we get him set up for a PET sarcoid protocol study - if you need help ordering correct test would reach out to our RN navigator team for PET/MRI since this is different than a cardiac PET scan.   Keep EP follow-up as planned to discuss strategy for his rhythm abnormality. I would recommend we see if we can get him in to see his primary cardiologist Dr. Servando Salina after the result is back; if no availability, keep appointment scheduled with [facility] as planned. I will cc both Renee and Scott on this result note too.

## 2023-04-13 NOTE — Telephone Encounter (Signed)
Call to patient to discuss MRI results. Patient verbalizes understanding of necessity of further imaging and agrees to Sarcoid protocol study. Order for Sarcoid PET scan placed. Patient agrees to keep all future scheduled appts.

## 2023-04-16 DIAGNOSIS — Z419 Encounter for procedure for purposes other than remedying health state, unspecified: Secondary | ICD-10-CM | POA: Diagnosis not present

## 2023-04-18 NOTE — Progress Notes (Unsigned)
Cardiology Office Note:  .   Date:  04/18/2023  ID:  Christopher Green, DOB 06/11/1971, MRN [id number] PCP: Sandre Kitty, MD  [facility] Providers Cardiologist:  Thomasene Ripple, DO Electrophysiologist:  Lewayne Bunting, MD {  History of Present Illness: .   [name] is a 52 y.o. 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 Ladona Ridgel 11/24/22, doing well, no changes were made.  He saw D. Dunn, PA 02/09/23 for recurrent syncope, suffered serious facial injuries, suspect to have been situational, perhaps cough at the hospital/ER. Pt described too many episodes to recall, no clear pattern, trigger by the note other then low BPs perhaps.he self stopped valsartan, hydrochlorothiazide and reported no additional events since then. Device interrogation noted no findings to explain syncope, AFib CVR, A lead undersensing "not new for him" Orthostatics + (mild, asymptomatic) Advised to the ER given so many events and being on OAC, though he declined, planned for labs Suspected low BP Labs suggested dehydration, advised to stay off valsartan/hydrochlorothiazide and his spironolactone dose reduced. Discussed f/u w/EP to visit rhythm control options Also discussed concerns of ongoing a/c in the environment of recurrent syncope.   He saw Wende Mott PA 02/26/23, reported no ongoing symptoms or syncope, and was likely low BP, pressure this visit was elevated though had not taken his meds yet.  Planned to monitor at home  Subsequently had c.MRI done, D. Dunn, PA-C noted:  I discussed MRI report with the doctor that read the study. This shows severe thickening of the heart as we saw on echo, with appearance of whether this could be a process of inflammation of the heart called sarcoidosis. Sarcoidosis can also cause  heart rhythm issues leading to pacemakers which may link these in his medical history. The EF pump function was down slightly on this study but this may be due to him breathing during the study - echo recently had shown that EF was normal. Dr. Izora Ribas would recommend we get him set up for a PET sarcoid protocol study >> ordered and pending     Today's visit is scheduled as a 4 week visit  ROS: ***  *** syncope? *** orthostatics *** xarelto, bleeding, labs, dose *** burden, symptoms, >>> if no ongoing syncope or concerns of need for interruption >> DCCV?   *** AAD options, needed?     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 Left ventricular ejection fraction, by estimation, is 60 to 65%. The  left ventricle has normal function. The left ventricle has no regional  wall motion abnormalities. There is severe concentric left ventricular  hypertrophy. Left ventricular diastolic   parameters are indeterminate.   2. Right ventricular systolic function is normal. The right ventricular  size is normal.   3. The mitral valve is normal in structure. Trivial mitral valve  regurgitation. No evidence of mitral stenosis.   4. The aortic valve is tricuspid. Aortic valve regurgitation is mild.  Aortic valve sclerosis is present, with no evidence of aortic valve  stenosis.   5.  The inferior vena cava is normal in size with greater than 50%  respiratory variability, suggesting right atrial pressure of 3 mmHg.   Risk Assessment/Calculations:    Physical Exam:   VS:  There were no vitals taken for this visit.   Wt Readings from Last 3 Encounters:  03/09/23 299 lb 12.8 oz (136 kg)  02/26/23 292 lb 12.8 oz (132.8 kg)   02/09/23 298 lb 9.6 oz (135.4 kg)    GEN: Well nourished, well developed in no acute distress NECK: No JVD; No carotid bruits CARDIAC: ***RRR, no murmurs, rubs, gallops RESPIRATORY:  *** CTA b/l without rales, wheezing or rhonchi  ABDOMEN: Soft, non-tender, non-distended EXTREMITIES:  No edema; No deformity   PPM site: *** is stable, no thinning, fluctuation, tethering  ASSESSMENT AND PLAN: .    persistent AFib CHA2DS2Vasc is 4, on Xarelto, *** appropriately dosed *** % burden  PPM ***   Syncope ***  HTN Severe conc LVH Relative hypotension w/meds ***  ***  Secondary hypercoagulable state 2/2 AFib, DVT, Pulmonary Embolism, and Antiphospholipid Antibody Syndrome      {Are you ordering a CV Procedure (e.g. stress test, cath, DCCV, TEE, etc)?   Press F2        :[id number]}     Dispo: ***  Signed, Sheilah Pigeon, PA-C

## 2023-04-21 ENCOUNTER — Ambulatory Visit: Payer: BC Managed Care – PPO | Attending: Physician Assistant | Admitting: Physician Assistant

## 2023-04-21 ENCOUNTER — Encounter: Payer: Self-pay | Admitting: Physician Assistant

## 2023-04-21 VITALS — BP 170/84 | HR 62 | Ht 73.0 in | Wt 277.2 lb

## 2023-04-21 DIAGNOSIS — Z950 Presence of cardiac pacemaker: Secondary | ICD-10-CM | POA: Diagnosis not present

## 2023-04-21 DIAGNOSIS — I10 Essential (primary) hypertension: Secondary | ICD-10-CM | POA: Diagnosis not present

## 2023-04-21 DIAGNOSIS — I517 Cardiomegaly: Secondary | ICD-10-CM

## 2023-04-21 DIAGNOSIS — I4811 Longstanding persistent atrial fibrillation: Secondary | ICD-10-CM

## 2023-04-21 DIAGNOSIS — D6869 Other thrombophilia: Secondary | ICD-10-CM

## 2023-04-21 LAB — CUP PACEART INCLINIC DEVICE CHECK
Battery Remaining Longevity: 111 mo
Battery Voltage: 3.01 V
Brady Statistic RA Percent Paced: 0 %
Brady Statistic RV Percent Paced: 72 %
Date Time Interrogation Session: 20241106104512
Implantable Lead Connection Status: 753985
Implantable Lead Connection Status: 753985
Implantable Lead Implant Date: 20240304
Implantable Lead Implant Date: 20240304
Implantable Lead Location: 753859
Implantable Lead Location: 753860
Implantable Pulse Generator Implant Date: 20240304
Lead Channel Impedance Value: 425 Ohm
Lead Channel Pacing Threshold Amplitude: 1 V
Lead Channel Pacing Threshold Amplitude: 1 V
Lead Channel Pacing Threshold Pulse Width: 0.5 ms
Lead Channel Pacing Threshold Pulse Width: 0.5 ms
Lead Channel Sensing Intrinsic Amplitude: 0.3 mV
Lead Channel Sensing Intrinsic Amplitude: 12 mV
Lead Channel Setting Pacing Amplitude: 2.5 V
Lead Channel Setting Pacing Pulse Width: 0.5 ms
Lead Channel Setting Sensing Sensitivity: 2 mV
Pulse Gen Model: 2272
Pulse Gen Serial Number: 8153879

## 2023-04-21 NOTE — Patient Instructions (Addendum)
Medication Instructions:  Your physician recommends that you continue on your current medications as directed. Please refer to the Current Medication list given to you today. *If you need a refill on your cardiac medications before your next appointment, please call your pharmacy*   Follow-Up: At [facility], you and your health needs are our priority.  As part of our continuing mission to provide you with exceptional heart care, we have created designated Provider Care Teams.  These Care Teams include your primary Cardiologist (physician) and Advanced Practice Providers (APPs -  Physician Assistants and Nurse Practitioners) who all work together to provide you with the care you need, when you need it.  We recommend signing up for the patient portal called "MyChart".  Sign up information is provided on this After Visit Summary.  MyChart is used to connect with patients for Virtual Visits (Telemedicine).  Patients are able to view lab/test results, encounter notes, upcoming appointments, etc.  Non-urgent messages can be sent to your provider as well.   To learn more about what you can do with MyChart, go to [email or web address].    Your next appointment:   6 month(s)  Provider:   Lewayne Bunting, MD or Francis Dowse, PA-C

## 2023-04-26 NOTE — Progress Notes (Unsigned)
Referring Physician:  Sandre Kitty, MD [address] [place],  [place] [address]  Primary Physician:  Sandre Kitty, MD  History of Present Illness: [name] has a history of HTN, atrial flutter, venous insufficiency, DM, peripheral polyneuropathy, hyperlipidemia, obesity, and chronic pain.   He has a pacemaker.   Has seen [facility] in the past and they discussed surgery for spinal stenosis.   Last seen by me on 02/02/23 for constant LBP with bilateral leg pain. MRI from 2021 showed lumbar spondylosis with multilevel foraminal stenosis. He has mild/moderate central stenosis L4-L5 with severe bilateral foraminal stenosis.   Lumbar MRI was ordered and he is here to review it.   He has constant LBP with bilateral posterior leg pain to his feet x years. Pain is feet is bad. Pain is worse with walking and standing in one position. A little better when he sits and leans forward. He has numbness, tingling, and weakness in his legs.   He has constant neck pain with bilateral arm numbness. No pain.  He has bilateral shoulder pain as well, left > right. He has weakness in both arms.   Ortho note from 04/30/21 references EMG that showed subacute C7 radiculopathy on his left and C8-T1 on his right.   He has some relief with neurontin.   He is on XARELTO.   He smokes 1 ppd x 30+ years.   Bowel/Bladder Dysfunction: none  Conservative measures:  Physical therapy: None Multimodal medical therapy including regular antiinflammatories: Oxycodone, Gabapentin, Tylenol, Ibuprofen, cymbalta  Injections:  Has done ESIs in past with relief  Past Surgery: no  [name] has no symptoms of cervical myelopathy. He has bilateral carpal tunnel, had surgery on the left.   The symptoms are causing a significant impact on the patient's life.   Review of Systems:  A 10 point review of systems is negative, except for the pertinent positives and negatives detailed  in the HPI.  Past Medical History: Past Medical History:  Diagnosis Date   Hypertension     Past Surgical History: Past Surgical History:  Procedure Laterality Date   CARDIAC CATHETERIZATION  2019   clean cors   PACEMAKER IMPLANT N/A 08/17/2022   Procedure: PACEMAKER IMPLANT;  Surgeon: Marinus Maw, MD;  Location: [facility];  Service: Cardiovascular;  Laterality: N/A;    Allergies: Allergies as of 04/27/2023   (No Known Allergies)    Medications: Outpatient Encounter Medications as of 04/27/2023  Medication Sig   atorvastatin (LIPITOR) 40 MG tablet Take 1 tablet (40 mg total) by mouth daily.   carvedilol (COREG) 3.125 MG tablet TAKE 1 TABLET BY MOUTH 2 TIMES DAILY WITH A MEAL.   cetirizine (ZYRTEC) 10 MG tablet Take 1 tablet (10 mg total) by mouth daily.   DULoxetine (CYMBALTA) 30 MG capsule Take 1 capsule (30 mg total) by mouth 2 (two) times daily.   gabapentin (NEURONTIN) 300 MG capsule Take 1 capsule (300 mg total) by mouth 2 (two) times daily.   hydrALAZINE (APRESOLINE) 100 MG tablet Take 1 tablet (100 mg total) by mouth 2 (two) times daily.   metFORMIN (GLUCOPHAGE) 500 MG tablet TAKE 1 TABLET BY MOUTH DAILY   Multiple Vitamins-Minerals (MULTIVITAMIN WITH MINERALS) tablet Take 1 tablet by mouth daily.   oxyCODONE-acetaminophen (PERCOCET) 10-325 MG tablet Take one tablet every 4 to 6 hours as needed for pain   rivaroxaban (XARELTO) 20 MG TABS tablet Take 1 tablet (20 mg total)  by mouth daily with supper.   Semaglutide, 1 MG/DOSE, 4 MG/3ML SOPN Inject 1 mg as directed once a week.   sildenafil (VIAGRA) 100 MG tablet Take 0.5-1 tablets (50-100 mg total) by mouth daily as needed for erectile dysfunction.   spironolactone (ALDACTONE) 25 MG tablet Take 0.5 tablets (12.5 mg total) by mouth daily.   testosterone cypionate (DEPOTESTOSTERONE CYPIONATE) 200 MG/ML injection Inject 1 mL (200 mg total) into the muscle every 14 (fourteen) days.   No facility-administered  encounter medications on file as of 04/27/2023.    Social History: Social History   Tobacco Use   Smoking status: Every Day    Types: Cigarettes    Passive exposure: Current   Smokeless tobacco: Never  Substance Use Topics   Alcohol use: Yes    Comment: daily   Drug use: Never    Family Medical History: Family History  Problem Relation Age of Onset   Heart attack Father     Physical Examination: There were no vitals filed for this visit.    Awake, alert, oriented to person, place, and time.  Speech is clear and fluent. Fund of knowledge is appropriate.   Cranial Nerves: Pupils equal round and reactive to light.  Facial tone is symmetric.    No posterior cervical or bilateral trapezial tenderness.   Mild lower posterior lumbar tenderness.   No abnormal lesions on exposed skin.   Strength: Side Biceps Triceps Deltoid Interossei Grip Wrist Ext. Wrist Flex.  R 5 5 5 5 5 5 5   L 5 5 5 5 5 5 5    Side Iliopsoas Quads Hamstring PF DF EHL  R 5 5 5 5 5 5   L 5 5 5 5 5 5    Reflexes are 2+ and symmetric at the biceps, brachioradialis, patella and achilles.   Hoffman's is absent.  Clonus is not present.   Bilateral upper and lower extremity sensation is intact to light touch.     No pain with ROM of his bilateral hips.   Limited ROM of left shoulder with pain. Reasonable ROM of right shoulder with minimal pain.   Gait is abnormal- he limps.   Medical Decision Making  Imaging: MRI lumbar spine dated 04/12/23:  FINDINGS: Segmentation: Conventional numbering is assumed with 5 non-rib-bearing, lumbar type vertebral bodies.   Alignment:  Normal.   Vertebrae: Modic type 2 degenerative endplate marrow signal changes at L3-4 and L4-5. 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   Conus medullaris and cauda equina: Conus extends to the L1 level. Conus and cauda equina appear normal.   Paraspinal and other soft tissues: Unremarkable.   Disc levels:    T12-L1:  Normal.   L1-L2: Small disc bulge and mild bilateral facet arthropathy. No spinal canal stenosis or neural foraminal narrowing.   L2-L3: Disc bulge and facet arthropathy results mild spinal canal stenosis, unchanged.   L3-L4: Right eccentric disc bulge and facet arthropathy results moderate narrowing of the right subarticular zone with displacement of the traversing right L4 nerve root, new from prior.   L4-L5: Disc bulge and right-greater-than-left facet arthropathy results in moderate spinal canal stenosis, severe right and moderate left neural foraminal narrowing, unchanged.   L5-S1: Small disc bulge without spinal canal stenosis or neural foraminal narrowing.   IMPRESSION: 1. 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 2. Moderate narrowing of the right subarticular  zone at L3-4 with displacement of the traversing right L4 nerve root, new from prior.     Electronically Signed   By: Orvan Falconer M.D.   On: 04/26/2023 14:48   MRI of cervical spine dated 08/27/22:  FINDINGS: The study is partially degraded by motion.   Alignment: Straightening of the cervical curvature.   Vertebrae: Marrow edema in the tip of the C7 spinous process with surrounding soft tissue edema. Endplate degenerative changes with associated marrow edema at C5-6. No evidence of discitis or aggressive bone lesion.   Cord: Mass effect on the cord at C5-6.  No cord signal abnormality.   Posterior Fossa, vertebral arteries, paraspinal tissues: Marrow edema about the C7 spinous process.   Disc levels:   C2-3: Posterior disc protrusion without significant spinal canal stenosis. Uncovertebral and facet degenerative changes resulting in moderate bilateral neural foraminal narrowing, left greater than right.   C3-4:  Posterior disc osteophyte complex without significant spinal canal stenosis. Uncovertebral and facet degenerative changes resulting in mild bilateral neural foraminal narrowing, left greater than right.   C4-5: Posterior disc osteophyte complex without significant spinal canal stenosis. Uncovertebral and facet degenerative change resulting in mild right and severe left neural foraminal narrowing.   C5-6: Posterior disc osteophyte complex, asymmetric to the right, resulting in moderate spinal canal stenosis with mild mass effect on the cord without cord signal abnormality. Uncovertebral and facet degenerative changes resulting in severe bilateral neural foraminal narrowing.   C6-7: Small posterior disc osteophyte complex without significant spinal canal stenosis. Uncovertebral and facet degenerative changes resulting in mild right and moderate left neural foraminal narrowing.   C7-T1: Facet degenerative change resulting mild bilateral neural foraminal narrowing. No significant spinal canal stenosis.   IMPRESSION: 1. Edema in the tip of the C7 spinous process with surrounding soft tissue edema suggestive of supraspinal ligament injury. 2. Degenerative changes of the cervical spine with moderate spinal canal stenosis at C5-6 where there is mild mass effect on the cord without cord signal abnormality. 3. Multilevel high-grade neural foraminal narrowing, as described above.     Electronically Signed   By: Baldemar Lenis M.D.   On: 08/26/2021 14:41    I have personally reviewed the images and agree with the above interpretation.  Assessment and Plan: Mr. Mcquarrie is a pleasant 52 y.o. male has constant LBP with bilateral posterior leg pain to his feet x years. Pain is worse with walking and standing in one position. He has numbness, tingling, and weakness in his legs.   He has known DDD L3-L5 with mild central stenosis L2-L3, right sided disc/facet hypertrophy L3-L4  with displacement of L4 nerve, and disc bulge/facet hypertrophy L4-L5 with moderate central stenosis and severe right/moderate left foraminal stenosis.   LBP likely from DDD/spondylosis. Leg pain may be from L4-L5 and he appears to be having symptoms from stenosis as well.   He has constant neck pain with bilateral arm numbness. No pain.  He has bilateral shoulder pain as well, left > right. He has weakness in both arms.   MRI from 2023 shows cervical spondylosis with multilevel foraminal stenosis and moderate central stenosis C5-C6.   Ortho note from 04/30/21 references EMG that showed subacute C7 radiculopathy on his left and C8-T1 on his right.    Treatment options discussed with patient and following plan made:   - He is interested in surgery options for his lumbar spine, but does not want a fusion. He has not done any PT in  the last year.  - PT orders to [facility] in [place]. He understands he needs to do at least 6 weeks of PT or be discharged.  - Smoking cessation discussed and encouraged.  - He would like to proceed with lumbar injections while he is doing PT. Referral to pain management ([facility]).  - He would need cardiology clearance prior any surgery. He has a pacemaker and is on XARELTO.   - Discussed getting an updated cervical MRI. He wants to hold off for now. He has no signs/symptoms of cervical myelopathy.   - Phone visit with me in 6 weeks to check on PT progress.   BP was elevated. No symptoms of chest pain, shortness of breath, blurry vision, or headaches. Will recheck at home and call PCP if not improved. If he develops CP, SOB, blurry vision, or headaches, then he will go to ED.     I spent a total of 35 minutes in face-to-face and non-face-to-face activities related to this patient's care today including review of outside records, review of imaging, review of symptoms, physical exam, discussion of differential diagnosis, discussion of treatment options, and documentation.    Drake Leach PA-C Dept. of Neurosurgery

## 2023-04-27 ENCOUNTER — Ambulatory Visit (INDEPENDENT_AMBULATORY_CARE_PROVIDER_SITE_OTHER): Payer: BC Managed Care – PPO | Admitting: Orthopedic Surgery

## 2023-04-27 ENCOUNTER — Encounter: Payer: Self-pay | Admitting: Orthopedic Surgery

## 2023-04-27 VITALS — BP 150/94 | Ht 73.0 in | Wt 288.2 lb

## 2023-04-27 DIAGNOSIS — M47816 Spondylosis without myelopathy or radiculopathy, lumbar region: Secondary | ICD-10-CM

## 2023-04-27 DIAGNOSIS — M4802 Spinal stenosis, cervical region: Secondary | ICD-10-CM

## 2023-04-27 DIAGNOSIS — M48061 Spinal stenosis, lumbar region without neurogenic claudication: Secondary | ICD-10-CM | POA: Diagnosis not present

## 2023-04-27 DIAGNOSIS — M51362 Other intervertebral disc degeneration, lumbar region with discogenic back pain and lower extremity pain: Secondary | ICD-10-CM

## 2023-04-27 DIAGNOSIS — M5412 Radiculopathy, cervical region: Secondary | ICD-10-CM

## 2023-04-27 DIAGNOSIS — M4726 Other spondylosis with radiculopathy, lumbar region: Secondary | ICD-10-CM | POA: Diagnosis not present

## 2023-04-27 DIAGNOSIS — M4722 Other spondylosis with radiculopathy, cervical region: Secondary | ICD-10-CM | POA: Diagnosis not present

## 2023-04-27 DIAGNOSIS — M47812 Spondylosis without myelopathy or radiculopathy, cervical region: Secondary | ICD-10-CM

## 2023-04-27 DIAGNOSIS — M5416 Radiculopathy, lumbar region: Secondary | ICD-10-CM

## 2023-04-27 NOTE — Patient Instructions (Addendum)
It was so nice to see you today. Thank you so much for coming in.    You have arthritis in your lower back along with spinal stenosis. You may be a surgery candidate for this, but you need to do physical therapy first.   I sent physical therapy orders to [facility] in [place]. You can call them at number below if you don't hear from them to schedule your visit.   I want you to see pain management here in [place] (Dr. Cherylann Ratel) to discuss possible lumbar injections. They should call you to schedule an appointment or you can call them at [phone].   We can discuss possible updated imaging of your neck when you are ready.   We have a phone visit scheduled in 6 weeks. Let me know if you are discharged from PT.   Continue to work on quitting smoking.   Please do not hesitate to call if you have any questions or concerns. You can also message me in MyChart.    Your blood pressure was elevated today. I want you to recheck it at home and follow up with your PCP if it remains high. If you have any chest pain, shortness of breath, blurry vision, or headaches then you need to go to ED.    Drake Leach PA-C [phone]     The physicians and staff at [facility] at [facility] are committed to providing excellent care. You may receive a survey asking for feedback about your experience at our office. We value you your feedback and appreciate you taking the time to to fill it out. The [facility] leadership team is also available to discuss your experience in person, feel free to contact us [phone].

## 2023-05-05 ENCOUNTER — Telehealth: Payer: Self-pay

## 2023-05-05 ENCOUNTER — Other Ambulatory Visit: Payer: Self-pay | Admitting: Family Medicine

## 2023-05-05 MED ORDER — OXYCODONE-ACETAMINOPHEN 10-325 MG PO TABS: ORAL_TABLET | ORAL | 0 refills | Status: DC

## 2023-05-05 NOTE — Telephone Encounter (Signed)
Prescription Request  05/05/2023  LOV: 03/09/23  What is the name of the medication or equipment? oxyCODONE-acetaminophen (PERCOCET) 10-325 MG tablet   Have you contacted your pharmacy to request a refill? No   Which pharmacy would you like this sent to?  [id number] - [place], [place] - [address] [place] [address] Phone: [phone] Fax: [phone]    Patient notified that their request is being sent to the clinical staff for review and that they should receive a response within 2 business days.   Please advise at Mobile [phone] (mobile)   Pt has an appt on 05/07/23

## 2023-05-05 NOTE — Telephone Encounter (Signed)
Prescription refilled.

## 2023-05-06 ENCOUNTER — Encounter (HOSPITAL_COMMUNITY): Payer: Self-pay

## 2023-05-07 ENCOUNTER — Encounter: Payer: Self-pay | Admitting: Family Medicine

## 2023-05-07 ENCOUNTER — Ambulatory Visit (INDEPENDENT_AMBULATORY_CARE_PROVIDER_SITE_OTHER): Payer: BC Managed Care – PPO | Admitting: Family Medicine

## 2023-05-07 VITALS — BP 155/71 | HR 61 | Ht 73.0 in | Wt 245.0 lb

## 2023-05-07 DIAGNOSIS — G894 Chronic pain syndrome: Secondary | ICD-10-CM | POA: Diagnosis not present

## 2023-05-07 DIAGNOSIS — R55 Syncope and collapse: Secondary | ICD-10-CM

## 2023-05-07 DIAGNOSIS — E291 Testicular hypofunction: Secondary | ICD-10-CM

## 2023-05-07 DIAGNOSIS — Z7984 Long term (current) use of oral hypoglycemic drugs: Secondary | ICD-10-CM

## 2023-05-07 DIAGNOSIS — E1169 Type 2 diabetes mellitus with other specified complication: Secondary | ICD-10-CM

## 2023-05-07 LAB — POCT GLYCOSYLATED HEMOGLOBIN (HGB A1C): HbA1c POC (<> result, manual entry): 6 % (ref 4.0–5.6)

## 2023-05-07 MED ORDER — LIDOCAINE 5 % EX PTCH: 1.0000 | MEDICATED_PATCH | CUTANEOUS | 2 refills | Status: DC

## 2023-05-07 NOTE — Patient Instructions (Addendum)
It was nice to see you today,  We addressed the following topics today: -For your pain we have maxed out the amount of opioids I feel you can safely take.  Hopefully the upcoming steroid injection will help with the pain.  I have also prescribed lidocaine patches.  Apply 1 patch in the morning and take it off before bed to the area that hurts the most. - Continue taking your Ozempic weekly. - Try to avoid eating excess calories.  Avoid drinking anything with calories in it such as sodas, tea, alcohol. - Make sure you go to the upcoming appointments on 12/4 and 12/9 for your imaging and physical therapy and steroid injection. - Schedule a follow-up appointment for lab testing next Wednesday so we can do your urine kidney function test and your testosterone level. - Try to take your testosterone the same time every week.  If the dose needs to be adjusted we can decrease or increase the dose but try to avoid skipping weeks  Have a great day,  Frederic Jericho, MD

## 2023-05-07 NOTE — Assessment & Plan Note (Signed)
No more episodes since adjusting his blood pressure medications.  Blood pressure was initially very elevated today but repeat was better.  Will not adjust his blood pressure medications without talking to his cardiologist first due to the previous episodes of syncope.

## 2023-05-07 NOTE — Assessment & Plan Note (Addendum)
Discussed with patient his pain regimen.  Told him it would be an increased risk to increase his opioid dosing.  He is already prescribed 150 tabs of 10 mg oxycodone monthly.  He only takes his gabapentin sparingly.  Does not take the duloxetine we had previously prescribed.  Will prescribe lidocaine 5% patch.  Patient is scheduled to get a localized injection in a few weeks with the pain management specialist.  Has not had a UDS recently.  Will add this on to the urine sample he provides for UACR.

## 2023-05-07 NOTE — Progress Notes (Signed)
Established Patient Office Visit  Subjective   Patient ID: Christopher Green, male    DOB: [birth year]  Age: 52 y.o. MRN: [id number]  Chief Complaint  Patient presents with   Medical Management of Chronic Issues    HPI Chronic back pain/neuropathy-patient has seen the neurosurgeon recently.  They recommended he do 6 weeks of physical therapy if possible and referred him to a pain management specialist to get a local injection.  Patient complaining of needing to take additional oxycodone.  We discussed how he is already on a significant amount of opioids and increasing it further would increase his risk of side effects or overdose.  He is understandable of this.  Is not taking duloxetine because he does not tolerate the medicine well.  Takes gabapentin but only sparingly and usually takes it if he runs out of oxycodone.  Patient has never asked for opioids prior to the scheduled refill date.  Discussed topical therapies.  Patient open to trying lidocaine patch.  Patient has not had any more recurrent episodes of syncope.  Is trying to avoid things that may have triggered in the past.  Is not taking the blood pressure medication that he discussed stopping with his cardiologist.  We discussed the PET scan upcoming to check for sarcoidosis.  Reminded patient of the date of this and his injections.  Patient has not been taking his testosterone regularly.  Stopped for a few weeks and then took it 2 days ago (Wednesday), usually takes it on Sundays.  Discussed the importance of taking this regularly and adjusting the dose as needed not the frequency.   The ASCVD Risk score (Arnett DK, et al., 2019) failed to calculate for the following reasons:   Cannot find a previous HDL lab   Cannot find a previous total cholesterol lab  Health Maintenance Due  Topic Date Due   COVID-19 Vaccine (1) Never done   OPHTHALMOLOGY EXAM  Never done   Diabetic kidney evaluation - Urine ACR  Never done   Hepatitis C  Screening  Never done   Colonoscopy  Never done   Lung Cancer Screening  05/26/2023      Objective:     BP (!) 155/71   Pulse 61   Ht 6\' 1"  (1.854 m)   Wt 245 lb (111.1 kg)   SpO2 96%   BMI 32.32 kg/m    Physical Exam General: Alert, oriented.  Appears older than stated age. CV: Regular rate and rhythm Pulmonary: Lungs clear bilaterally MSK: Patient leans forward when sitting to relieve pain.  Slowed antalgic gait.  Slow to rise from sitting position.    Results for orders placed or performed in visit on 05/07/23  POCT HgB A1C  Result Value Ref Range   Hemoglobin A1C     HbA1c POC (<> result, manual entry) 6.0 4.0 - 5.6 %   HbA1c, POC (prediabetic range)     HbA1c, POC (controlled diabetic range)          Assessment & Plan:   Hypogonadism in male Assessment & Plan: Again discussed with the patient that it is important to take the medication regularly and not adjust the dose or frequency outside of what we discussed in our appointment.  Gave himself an injection 2 days ago, so we will recheck his levels on Monday.  Orders: -     Testosterone; Future  Type 2 diabetes mellitus with other specified complication, without long-term current use of insulin (HCC) Assessment & Plan: Has  lost some weight, but the 245 pounds listed today was inaccurate.  He is closer to 283 pounds.  Weighs himself at home.  A1c continues to be well-controlled.  Gets nausea with the Ozempic for a day or 2 after injecting.  Advised him to use nausea medication as needed. - Follow-up UACR.  Patient unable to give urine today.  Orders: -     Microalbumin / creatinine urine ratio; Future -     POCT glycosylated hemoglobin (Hb A1C)  Syncope, unspecified syncope type Assessment & Plan: No more episodes since adjusting his blood pressure medications.  Blood pressure was initially very elevated today but repeat was better.  Will not adjust his blood pressure medications without talking to his  cardiologist first due to the previous episodes of syncope.   Chronic pain syndrome Assessment & Plan: Discussed with patient his pain regimen.  Told him it would be an increased risk to increase his opioid dosing.  He is already prescribed 150 tabs of 10 mg oxycodone monthly.  He only takes his gabapentin sparingly.  Does not take the duloxetine we had previously prescribed.  Will prescribe lidocaine 5% patch.  Patient is scheduled to get a localized injection in a few weeks with the pain management specialist.  Has not had a UDS recently.  Will add this on to the urine sample he provides for UACR.  Orders: -     POCT Urine drug screen; Future  Other orders -     Lidocaine; Place 1 patch onto the skin daily. Remove & Discard patch within 12 hours or as directed by MD  Dispense: 30 patch; Refill: 2   I spent 33 minutes in the management of this patient.  Return in about 3 months (around 08/07/2023) for DM, testosterone.    Sandre Kitty, MD

## 2023-05-07 NOTE — Assessment & Plan Note (Signed)
Again discussed with the patient that it is important to take the medication regularly and not adjust the dose or frequency outside of what we discussed in our appointment.  Gave himself an injection 2 days ago, so we will recheck his levels on Monday.

## 2023-05-07 NOTE — Assessment & Plan Note (Signed)
Has lost some weight, but the 245 pounds listed today was inaccurate.  He is closer to 283 pounds.  Weighs himself at home.  A1c continues to be well-controlled.  Gets nausea with the Ozempic for a day or 2 after injecting.  Advised him to use nausea medication as needed. - Follow-up UACR.  Patient unable to give urine today.

## 2023-05-10 ENCOUNTER — Other Ambulatory Visit: Payer: BC Managed Care – PPO

## 2023-05-10 DIAGNOSIS — E1169 Type 2 diabetes mellitus with other specified complication: Secondary | ICD-10-CM

## 2023-05-10 DIAGNOSIS — E291 Testicular hypofunction: Secondary | ICD-10-CM

## 2023-05-11 LAB — MICROALBUMIN / CREATININE URINE RATIO
Creatinine, Urine: 253.1 mg/dL
Microalb/Creat Ratio: 36 mg/g{creat} — ABNORMAL HIGH (ref 0–29)
Microalbumin, Urine: 91 ug/mL

## 2023-05-11 LAB — TESTOSTERONE: Testosterone: 808 ng/dL (ref 264–916)

## 2023-05-12 ENCOUNTER — Telehealth: Payer: Self-pay

## 2023-05-12 NOTE — Telephone Encounter (Signed)
Called pt advising him of his denial letter. One has been sent in the mail as well. Pt stated he did not want to appeal the decision.

## 2023-05-16 DIAGNOSIS — Z419 Encounter for procedure for purposes other than remedying health state, unspecified: Secondary | ICD-10-CM | POA: Diagnosis not present

## 2023-05-17 ENCOUNTER — Other Ambulatory Visit: Payer: Self-pay | Admitting: Family Medicine

## 2023-05-17 ENCOUNTER — Telehealth (HOSPITAL_COMMUNITY): Payer: Self-pay | Admitting: *Deleted

## 2023-05-17 LAB — CUP PACEART REMOTE DEVICE CHECK
Battery Remaining Longevity: 112 mo
Battery Remaining Percentage: 94 %
Battery Voltage: 3.01 V
Brady Statistic RV Percent Paced: 69 %
Date Time Interrogation Session: 20241202020015
Implantable Lead Connection Status: 753985
Implantable Lead Connection Status: 753985
Implantable Lead Implant Date: 20240304
Implantable Lead Implant Date: 20240304
Implantable Lead Location: 753859
Implantable Lead Location: 753860
Implantable Pulse Generator Implant Date: 20240304
Lead Channel Impedance Value: 430 Ohm
Lead Channel Pacing Threshold Amplitude: 1 V
Lead Channel Pacing Threshold Pulse Width: 0.5 ms
Lead Channel Sensing Intrinsic Amplitude: 12 mV
Lead Channel Setting Pacing Amplitude: 2.5 V
Lead Channel Setting Pacing Pulse Width: 0.5 ms
Lead Channel Setting Sensing Sensitivity: 2 mV
Pulse Gen Model: 2272
Pulse Gen Serial Number: 8153879

## 2023-05-17 NOTE — Telephone Encounter (Signed)
Reaching out to patient to offer assistance regarding upcoming cardiac imaging study; pt verbalizes understanding of appt date/time, parking situation and where to check in, pre-test NPO status; name and call back number provided for further questions should they arise  Larey Brick RN Navigator Cardiac Imaging [facility] [phone] office [phone] cell  Patient verbalized understanding of diet prep.

## 2023-05-18 ENCOUNTER — Ambulatory Visit (INDEPENDENT_AMBULATORY_CARE_PROVIDER_SITE_OTHER): Payer: BC Managed Care – PPO

## 2023-05-18 DIAGNOSIS — I441 Atrioventricular block, second degree: Secondary | ICD-10-CM

## 2023-05-18 NOTE — Therapy (Signed)
OUTPATIENT PHYSICAL THERAPY THORACOLUMBAR EVALUATION   Patient Name: Christopher Green MRN: [id number] DOB:[birth year], 52 y.o., male Today's Date: 05/19/2023  END OF SESSION:  PT End of Session - 05/19/23 0938     Visit Number 1    Number of Visits 17    Date for PT Re-Evaluation 07/14/23    Authorization Type [employer]    PT Start Time [phone]   late check in   PT Stop Time 1014    PT Time Calculation (min) 35 min    Activity Tolerance Patient tolerated treatment well;No increased pain    Behavior During Therapy WFL for tasks assessed/performed             Past Medical History:  Diagnosis Date   Alcohol abuse    Arthritis    Atrial flutter (HCC)    Chronic pain    Diabetes mellitus without complication (HCC)    Hyperlipidemia    Hypertension    Obesity    Peripheral polyneuropathy    Tobacco abuse    Venous insufficiency    Past Surgical History:  Procedure Laterality Date   CARDIAC CATHETERIZATION  2019   clean cors   CARPAL TUNNEL RELEASE Left    PACEMAKER IMPLANT N/A 08/17/2022   Procedure: PACEMAKER IMPLANT;  Surgeon: Marinus Maw, MD;  Location: [facility];  Service: Cardiovascular;  Laterality: N/A;   Patient Active Problem List   Diagnosis Date Noted   Persistent atrial fibrillation (HCC) 02/26/2023   Coronary artery disease involving native coronary artery of native heart without angina pectoris 02/26/2023   LVH (left ventricular hypertrophy) 02/26/2023   Chronic bilateral low back pain with bilateral sciatica 01/10/2023   Syncope 12/08/2022   Hypogonadism in male 12/04/2022   Pacemaker 11/24/2022   Chronic pain syndrome 11/11/2022   Peripheral polyneuropathy 10/05/2022   Lupus anticoagulant disorder (HCC) 10/05/2022   Atrial flutter (HCC) 09/04/2022   Obesity (BMI 30-39.9) 09/04/2022   Diabetes mellitus (HCC) 09/04/2022   Venous insufficiency 09/04/2022   Hyperlipidemia LDL goal <70 05/25/2022   Second degree AV block 05/25/2022   Tobacco  use 02/28/2018   Essential hypertension 04/20/2017    PCP: Sandre Kitty, MD  REFERRING PROVIDER: Drake Leach, PA-C  REFERRING DIAG: [id number] (ICD-10-CM) - Lumbar spondylosis M54.16 (ICD-10-CM) - Lumbar radiculopathy M48.061 (ICD-10-CM) - Spinal stenosis of lumbar region, unspecified whether neurogenic claudication present  Rationale for Evaluation and Treatment: Rehabilitation  THERAPY DIAG:  Other low back pain  Abnormal posture  ONSET DATE: several years  SUBJECTIVE:  SUBJECTIVE STATEMENT: Endorses several year history of back pain without injury, slowly worsening over time. Pt states it has been attributed to disc issues. He does report history of extensive weight lifting and heavy occupational demands. Pt reports reduced activity levels due to back and cardiac issues. Does endorse BIL numbness with prolonged walking/standing, doesn't seem to favor either LE. Pt states he is currently receiving cardiac work up and had cardiac PET/CT this morning, denies restrictions or recent changes in symptoms. Also has some neck problems, LUE numbness that has been ongoing without significant changes.  No saddle anesthesia or bowel/bladder symptoms. No buckling.   PERTINENT HISTORY:  a flutter, A fib, LVH, DM, HTN, peripheral polyneuropathy, venous insuffiency, pacemaker 08/17/22, lupus + anticoagulant disorder, syncope  PAIN:  Are you having pain: none Location/description: central low back, BIL LE posteriorly  Best-worst over past week: 0-10/10  - aggravating factors: walking/standing ~11min - Easing factors: avoiding provocative activities, pregabalin  PRECAUTIONS: pacemaker, cardiac history   WEIGHT BEARING RESTRICTIONS: No  FALLS:  Has patient fallen in last 6 months? Yes. Number of falls hx  syncope, improved. Denies balance issues  LIVING ENVIRONMENT: 1 story house with wife, a few steps to enter with rails Spouse does most of housework Occasionally using a golf club as a cane Has a walker at home but doesn't use  OCCUPATION: not working - states he is working with disability process. 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  DIAGNOSTIC FINDINGS:  Lumbar MRI 04/26/23: "IMPRESSION: 1. 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 2. Moderate narrowing of the right subarticular zone at L3-4 with displacement of the traversing right L4 nerve root, new from prior."  [facility] 02/10/23: "IMPRESSION: 1. No hemorrhage or CT evidence of an acute cortical infarct 2. Chronic left orbital floor fracture."  Cervical MRI 08/26/21: "IMPRESSION: 1. Edema in the tip of the C7 spinous process with surrounding soft tissue edema suggestive of supraspinal ligament injury. 2. Degenerative changes of the cervical spine with moderate spinal canal stenosis at C5-6 where there is mild mass effect on the cord without cord signal abnormality. 3. Multilevel high-grade neural foraminal narrowing, as described above."  PATIENT SURVEYS:  FOTO 48 > 58   COGNITION: Overall cognitive status: Within functional limits for tasks assessed     SENSATION/NEURO: Light touch intact BIL LE No clonus either LE Negative hoffmann and tromner sign BIL No ataxia with gait   POSTURE: fwd head. Thoracic kyphosis, reduced lumbar lordosis.   PALPATION: Deferred given time  constraints  LUMBAR ROM:   AROM eval  Flexion 100% painless  Extension 100%   Right lateral flexion   Left lateral flexion   Right rotation 100%  Left rotation 100%   (Blank rows = not tested) (Key: WFL = within functional limits not formally assessed, * = concordant pain, s = stiffness/stretching sensation, NT = not tested)   LOWER EXTREMITY ROM:     Active  Right eval Left eval  Hip flexion    Hip extension    Hip internal rotation    Hip external rotation    Knee extension    Knee flexion    (  Blank rows = not tested) (Key: WFL = within functional limits not formally assessed, * = concordant pain, s = stiffness/stretching sensation, NT = not tested)  Comments:    LOWER EXTREMITY MMT:    MMT Right eval Left eval  Hip flexion 4 4  Hip abduction (modified sitting) 4 4  Hip internal rotation    Hip external rotation    Knee flexion 5 5  Knee extension 5 5  Ankle dorsiflexion 5 5   (Blank rows = not tested) (Key: WFL = within functional limits not formally assessed, * = concordant pain, s = stiffness/stretching sensation, NT = not tested)  Comments:    LUMBAR SPECIAL TESTS:  Deferred given time constraints  FUNCTIONAL TESTS:  Deferred given time constraints  GAIT: Distance walked: within clinic Assistive device utilized: None Level of assistance: Complete Independence Comments: widened BOS, mildly antalgic gait RLE, reduced trunk rotation w/ mild lateral shift towards R  TODAY'S TREATMENT:                                                                                                                              [facility] Adult PT Treatment:                                                DATE: 05/19/23 Therapeutic Exercise: Seated pelvic tilt x10 cues for appropriate form Seated march x10 BIL cues for posture and control HEP handout + education    PATIENT EDUCATION:  Education details: Pt education on PT impairments, prognosis, and POC. Informed consent.  Rationale for interventions, safe/appropriate HEP performance Person educated: Patient Education method: Explanation, Demonstration, Tactile cues, Verbal cues, and Handouts Education comprehension: verbalized understanding, returned demonstration, verbal cues required, tactile cues required, and needs further education    HOME EXERCISE PROGRAM: Access Code: [email or web address] URL: [email or web address] Date: 05/19/2023 Prepared by: Fransisco Hertz  Exercises - Seated Pelvic Tilt  - 2-3 x daily - 1 sets - 10 reps - Seated March  - 2-3 x daily - 1 sets - 10 reps  ASSESSMENT:  CLINICAL IMPRESSION: Patient is a pleasant 52 y.o. gentleman who was seen today for physical therapy evaluation and treatment for chronic low back pain ongoing several years w/o mechanism of injury, although he endorses heavy weightlifting history and heavy occupational demands. Lumbar MRI as above and pt denies any red flag symptomology. On exam he demonstrates normal and painless lumbar ROM, mild hip weakness, and postural deficits as above - further assessment deferred given time constraints. Tolerates exam and HEP well without adverse event or increase in pain. At present recommend trial of skilled PT to address aforementioned deficits with aim of improving functional tolerance. Pt departs today's session in no acute distress, all voiced questions/concerns addressed appropriately from PT perspective.    OBJECTIVE IMPAIRMENTS: Abnormal gait, cardiopulmonary status  limiting activity, decreased activity tolerance, decreased endurance, decreased mobility, difficulty walking, decreased strength, improper body mechanics, postural dysfunction, and pain.   ACTIVITY LIMITATIONS: carrying, lifting, standing, squatting, and locomotion level  PARTICIPATION LIMITATIONS: meal prep, cleaning, laundry, community activity, and occupation  PERSONAL FACTORS: Time since onset of injury/illness/exacerbation and 3+ comorbidities: a  flutter, A fib, LVH, DM, HTN, peripheral polyneuropathy, venous insuffiency, pacemaker 08/17/22, lupus + anticoagulant disorder, syncope  are also affecting patient's functional outcome.   REHAB POTENTIAL: Fair given chronicity and comorbidities  CLINICAL DECISION MAKING: Evolving/moderate complexity  EVALUATION COMPLEXITY: Moderate   GOALS: Goals reviewed with patient? Yes  SHORT TERM GOALS: Target date: 06/16/2023 Pt will demonstrate appropriate understanding and performance of initially prescribed HEP in order to facilitate improved independence with management of symptoms.  Baseline: HEP provided on eval Goal status: INITIAL   2. Pt will score greater than or equal to 53 on FOTO in order to demonstrate improved perception of function due to symptoms.  Baseline: 48  Goal status: INITIAL    LONG TERM GOALS: Target date: 07/14/2023 Pt will score 58 on FOTO in order to demonstrate improved perception of functional status due to symptoms.  Baseline: 48 Goal status: INITIAL  2.  Pt will report at least 50% decrease in overall pain levels in past week in order to facilitate improved tolerance to basic ADLs/mobility.   Baseline: 0-10/10  Goal status: INITIAL    3.  Pt will demonstrate hip flexion MMT of at least 4+/5 in order to demonstrate improved strength for functional movements.  Baseline: see MMT chart above Goal status: INITIAL  4. Pt will improve at least MCID on 5xSTS in order to demonstrate reduced fall risk and improved functional independence. (MCID of 2.3sec)  Baseline: deferred on eval given time constraints  Goal status: INITIAL   5. Pt will report/demonstrate ability to stand/walk for up to with less than 3 pt increase in low back pain vs LE numbness in order to facilitate improved functional tolerance.   Baseline: ~54min per pt report  Goal status: INITIAL   PLAN:  PT FREQUENCY: 1-2x/week  PT DURATION: 8 weeks  PLANNED INTERVENTIONS: [id number]- PT  Re-evaluation, 97110-Therapeutic exercises, 97530- Therapeutic activity, 97112- Neuromuscular re-education, 97535- Self Care, 16109- Manual therapy, [phone]- Gait training, Patient/Family education, Balance training, Stair training, Taping, Dry Needling, Joint mobilization, Spinal mobilization, Cryotherapy, and Moist heat.  PLAN FOR NEXT SESSION: Review/update HEP PRN. 5xSTS as appropriate. Work on [employer] exercises as appropriate with emphasis on lumbopelvic control and hip strengthening. Symptom modification strategies as indicated/appropriate. Mindful of pacemaker, cardiac history, and anticoagulation.    Ashley Murrain PT, DPT 05/19/2023 4:55 PM

## 2023-05-19 ENCOUNTER — Ambulatory Visit: Payer: BC Managed Care – PPO | Attending: Orthopedic Surgery | Admitting: Physical Therapy

## 2023-05-19 ENCOUNTER — Ambulatory Visit (HOSPITAL_COMMUNITY)
Admission: RE | Admit: 2023-05-19 | Discharge: 2023-05-19 | Disposition: A | Discharge status: Home / Self Care | Payer: BC Managed Care – PPO | Source: Ambulatory Visit | Attending: Physician Assistant | Admitting: Physician Assistant

## 2023-05-19 ENCOUNTER — Encounter: Payer: Self-pay | Admitting: Physical Therapy

## 2023-05-19 DIAGNOSIS — D8685 Sarcoid myocarditis: Secondary | ICD-10-CM

## 2023-05-19 DIAGNOSIS — M47816 Spondylosis without myelopathy or radiculopathy, lumbar region: Secondary | ICD-10-CM | POA: Insufficient documentation

## 2023-05-19 DIAGNOSIS — M5459 Other low back pain: Secondary | ICD-10-CM | POA: Insufficient documentation

## 2023-05-19 DIAGNOSIS — R293 Abnormal posture: Secondary | ICD-10-CM | POA: Diagnosis not present

## 2023-05-19 DIAGNOSIS — M5416 Radiculopathy, lumbar region: Secondary | ICD-10-CM | POA: Insufficient documentation

## 2023-05-19 DIAGNOSIS — M48061 Spinal stenosis, lumbar region without neurogenic claudication: Secondary | ICD-10-CM | POA: Diagnosis not present

## 2023-05-19 MED ORDER — FLUDEOXYGLUCOSE F - 18 (FDG) INJECTION
8.9800 | Freq: Once | INTRAVENOUS | Status: AC
  Administered 2023-05-19: 8.98 via INTRAVENOUS

## 2023-05-19 MED ORDER — RUBIDIUM RB82 GENERATOR (RUBYFILL)
28.7100 | PACK | Freq: Once | INTRAVENOUS | Status: AC
  Administered 2023-05-19: 28.71 via INTRAVENOUS

## 2023-05-20 LAB — NM PET CT MYOCARDIAL SARCOIDOSIS
Nuc Stress EF: 36 %
Rest Nuclear Isotope Dose: 28.7 mCi

## 2023-05-21 ENCOUNTER — Encounter: Payer: Self-pay | Admitting: Physician Assistant

## 2023-05-24 ENCOUNTER — Encounter: Payer: Self-pay | Admitting: Student in an Organized Health Care Education/Training Program

## 2023-05-24 ENCOUNTER — Ambulatory Visit
Payer: BC Managed Care – PPO | Attending: Student in an Organized Health Care Education/Training Program | Admitting: Student in an Organized Health Care Education/Training Program

## 2023-05-24 VITALS — BP 183/95 | HR 60 | Temp 97.2°F | Resp 16 | Ht 73.0 in | Wt 285.0 lb

## 2023-05-24 DIAGNOSIS — M47816 Spondylosis without myelopathy or radiculopathy, lumbar region: Secondary | ICD-10-CM | POA: Diagnosis not present

## 2023-05-24 DIAGNOSIS — M4802 Spinal stenosis, cervical region: Secondary | ICD-10-CM | POA: Diagnosis not present

## 2023-05-24 DIAGNOSIS — M5412 Radiculopathy, cervical region: Secondary | ICD-10-CM | POA: Diagnosis not present

## 2023-05-24 DIAGNOSIS — G894 Chronic pain syndrome: Secondary | ICD-10-CM | POA: Diagnosis not present

## 2023-05-24 NOTE — Progress Notes (Signed)
Patient: Christopher Green  Service Category: E/M  Provider: Edward Jolly, MD  DOB: 01/29/1971  DOS: 05/24/2023  Referring Provider: Gardiner Green  MRN: [id number]  Setting: Ambulatory outpatient  PCP: Christopher Kitty, MD  Type: New Patient  Specialty: Interventional Pain Management    Location: Office  Delivery: Face-to-face     Primary Reason(s) for Visit: Encounter for initial evaluation of one or more chronic problems (new to examiner) potentially causing chronic pain, and posing a threat to normal musculoskeletal function. (Level of risk: High) CC: Back Pain (lower)  HPI  Christopher Green is a 52 y.o. year old, male patient, who comes for the first time to our practice referred by Christopher Leach, PA-C for our initial evaluation of his chronic pain. He has Essential hypertension; Hyperlipidemia LDL goal <70; Second degree AV block; Tobacco use; Atrial flutter (HCC); Obesity (BMI 30-39.9); Diabetes mellitus (HCC); Venous insufficiency; Peripheral polyneuropathy; Lupus anticoagulant disorder (HCC); Chronic pain syndrome; Pacemaker; Hypogonadism in male; Syncope; Chronic bilateral low back pain with bilateral sciatica; Persistent atrial fibrillation (HCC); Coronary artery disease involving native coronary artery of native heart without angina pectoris; LVH (left ventricular hypertrophy); Cervical radicular pain; Foraminal stenosis of cervical region; and Lumbar facet arthropathy on their problem list. Today he comes in for evaluation of his Back Pain (lower)  Pain Assessment: Location: Lower Back Radiating: both legs to the toes Onset: 1 to 4 weeks ago Duration: Chronic pain Quality: Constant, Discomfort Severity: 4 /10 (subjective, self-reported pain score)  Effect on ADL: difficulty performing daily activities Timing: Constant Modifying factors: Oxycodone, Pregabalin, Gabapentin BP: (!) 183/95  HR: 60  Onset and Duration: Present less than 3 months Cause of pain: Work related accident or  event Severity: Getting worse, NAS-11 at its worse: 10/10, NAS-11 at its best: 3/10, NAS-11 now: 4/10, and NAS-11 on the average: 5/10 Timing: Night, During activity or exercise, and After activity or exercise Aggravating Factors: Motion Alleviating Factors:  No answer  Associated Problems: Fatigue, Numbness, Weakness, Pain that wakes patient up, and Pain that does not allow patient to sleep Quality of Pain: Agonizing, Annoying, Burning, Constant, Deep, Disabling, Distressing, Dreadful, Dull, Exhausting, Horrible, Nagging, Punishing, Sharp, Shooting, and Stabbing Previous Examinations or Tests: MRI scan and X-rays Previous Treatments: Trigger point injections    History of Present Illness   The patient, Christopher Green, was referred by neurosurgery for management of chronic neck and lower back pain. The neck pain, which has been present for approximately 4-5 years, is currently the more severe of the two. The patient attributes the onset of these symptoms to work-related activities. The pain has been progressively worsening, and is now associated with numbness radiating down the arms. The patient has previously received a cervical epidural injection, which provided temporary relief.  His low back pain has been evaluated in the past with neurosurgery.  He has low back pain with bilateral leg pain.  Recent lumbar MRI as below which shows disc herniations, facet arthropathy resulting in right L4 nerve root displacement.  He also has moderate spinal canal stenosis along with severe right and moderate left foraminal stenosis at L4-L5.  Medications are currently managed with primary care provider.  He takes Percocet 10 mg every 4-6 hours, quantity 150/month.  I was very clear with the patient that I will be focusing on interventional pain management and that medication management is to continue with his PCP.  The patient also has a history of frequent syncope episodes, with one significant episode resulting  in facial trauma after a fall from a truck. The patient believes these episodes were related to a blood pressure medication. The patient is currently on Xarelto, a blood thinner, and has a pacemaker.  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 The patient is currently awaiting a final disability hearing.    Historic Controlled Substance Pharmacotherapy Review  PMP and historical list of controlled substances: Percocet 10 mg every 4-6 hours as needed, quantity 150/month MME equals 90.  Managed by PCP Historical Monitoring: The patient  reports no history of drug use. List of prior UDS Testing: No results found for: "MDMA", "COCAINSCRNUR", "PCPSCRNUR", "PCPQUANT", "CANNABQUANT", "THCU", "ETH", "CBDTHCR", "D8THCCBX", "D9THCCBX" Historical Background Evaluation: [place] PMP: PDMP not reviewed this encounter. Review of the past 31-months conducted.              Christopher [place] Department of public safety, offender search: Engineer, mining Information) Non-contributory Risk Assessment Profile: Aberrant behavior: None observed or detected today Risk factors for fatal opioid overdose: age 65-59 years old Fatal overdose hazard ratio (HR): 1.92 for 50-99 MME/day Non-fatal overdose hazard ratio (HR): 3.73 for 50-99 MME/day Risk of opioid abuse or dependence: 0.7-3.0% with doses <= 36 MME/day and 6.1-26% with doses >= 120 MME/day. Substance use disorder (SUD) risk level: Moderate   Pharmacologic Plan: Continue with PCP I will be focusing on interventional pain management   Meds   Current Outpatient Medications:    atorvastatin (LIPITOR) 40 MG tablet, Take 1 tablet (40 mg total) by mouth daily., Disp: 90 tablet, Rfl: 3   carvedilol (COREG) 3.125 MG tablet, TAKE 1 TABLET BY MOUTH 2 TIMES DAILY WITH A MEAL., Disp: 60 tablet, Rfl: 3   cetirizine (ZYRTEC) 10 MG tablet, Take 1 tablet (10 mg total) by mouth daily., Disp: 30 tablet, Rfl: 11   DULoxetine (CYMBALTA) 30 MG  capsule, Take 1 capsule (30 mg total) by mouth 2 (two) times daily., Disp: 180 capsule, Rfl: 1   gabapentin (NEURONTIN) 300 MG capsule, Take 1 capsule (300 mg total) by mouth 2 (two) times daily., Disp: 60 capsule, Rfl: 1   hydrALAZINE (APRESOLINE) 100 MG tablet, Take 1 tablet (100 mg total) by mouth 2 (two) times daily., Disp: 180 tablet, Rfl: 3   lidocaine (LIDODERM) 5 %, Place 1 patch onto the skin daily. Remove & Discard patch within 12 hours or as directed by MD, Disp: 30 patch, Rfl: 2   metFORMIN (GLUCOPHAGE) 500 MG tablet, TAKE 1 TABLET BY MOUTH DAILY, Disp: 60 tablet, Rfl: 0   Multiple Vitamins-Minerals (MULTIVITAMIN WITH MINERALS) tablet, Take 1 tablet by mouth daily., Disp: , Rfl:    oxyCODONE-acetaminophen (PERCOCET) 10-325 MG tablet, Take one tablet every 4 to 6 hours as needed for pain, Disp: 150 tablet, Rfl: 0   rivaroxaban (XARELTO) 20 MG TABS tablet, Take 1 tablet (20 mg total) by mouth daily with supper., Disp: 90 tablet, Rfl: 3   Semaglutide, 1 MG/DOSE, 4 MG/3ML SOPN, Inject 1 mg as directed once a week., Disp: 3 mL, Rfl: 3   sildenafil (VIAGRA) 100 MG tablet, Take 0.5-1 tablets (50-100 mg total) by mouth daily as needed for erectile dysfunction., Disp: 10 tablet, Rfl: 11   spironolactone (ALDACTONE) 25 MG tablet, Take 0.5 tablets (12.5 mg total) by mouth daily., Disp: , Rfl:    testosterone cypionate (DEPOTESTOSTERONE CYPIONATE) 200 MG/ML injection, Inject 1 mL (200 mg total) into the muscle every 14 (fourteen) days., Disp: 10 mL, Rfl: 1  Imaging Review  Cervical Imaging:  Cervical MR wo contrast: Results for orders placed during the hospital encounter of 08/26/21  MR CERVICAL SPINE WO CONTRAST  Narrative CLINICAL DATA:  Cervical radiculitis M54.12 (ICD-10-CM).  EXAM: MRI CERVICAL SPINE WITHOUT CONTRAST  TECHNIQUE: Multiplanar, multisequence MR imaging of the cervical spine was performed. No intravenous contrast was administered.  COMPARISON:  None.  FINDINGS: The  study is partially degraded by motion.  Alignment: Straightening of the cervical curvature.  Vertebrae: Marrow edema in the tip of the C7 spinous process with surrounding soft tissue edema. Endplate degenerative changes with associated marrow edema at C5-6. No evidence of discitis or aggressive bone lesion.  Cord: Mass effect on the cord at C5-6.  No cord signal abnormality.  Posterior Fossa, vertebral arteries, paraspinal tissues: Marrow edema about the C7 spinous process.  Disc levels:  C2-3: Posterior disc protrusion without significant spinal canal stenosis. Uncovertebral and facet degenerative changes resulting in moderate bilateral neural foraminal narrowing, left greater than right.  C3-4: Posterior disc osteophyte complex without significant spinal canal stenosis. Uncovertebral and facet degenerative changes resulting in mild bilateral neural foraminal narrowing, left greater than right.  C4-5: Posterior disc osteophyte complex without significant spinal canal stenosis. Uncovertebral and facet degenerative change resulting in mild right and severe left neural foraminal narrowing.  C5-6: Posterior disc osteophyte complex, asymmetric to the right, resulting in moderate spinal canal stenosis with mild mass effect on the cord without cord signal abnormality. Uncovertebral and facet degenerative changes resulting in severe bilateral neural foraminal narrowing.  C6-7: Small posterior disc osteophyte complex without significant spinal canal stenosis. Uncovertebral and facet degenerative changes resulting in mild right and moderate left neural foraminal narrowing.  C7-T1: Facet degenerative change resulting mild bilateral neural foraminal narrowing. No significant spinal canal stenosis.  IMPRESSION: 1. Edema in the tip of the C7 spinous process with surrounding soft tissue edema suggestive of supraspinal ligament injury. 2. Degenerative changes of the cervical spine  with moderate spinal canal stenosis at C5-6 where there is mild mass effect on the cord without cord signal abnormality. 3. Multilevel high-grade neural foraminal narrowing, as described above.   Electronically Signed By: Baldemar Lenis M.D. On: 08/26/2021 14:41  MR LUMBAR SPINE WO CONTRAST  Narrative CLINICAL DATA:  Lumbar radiculopathy, symptoms persist with > 6 wks treatment. Low back pain radiating to both legs.  EXAM: MRI LUMBAR SPINE WITHOUT CONTRAST  TECHNIQUE: Multiplanar, multisequence MR imaging of the lumbar spine was performed. No intravenous contrast was administered.  COMPARISON:  Lumbar spine MRI 12/19/2019.  FINDINGS: Segmentation: Conventional numbering is assumed with 5 non-rib-bearing, lumbar type vertebral bodies.  Alignment:  Normal.  Vertebrae: Modic type 2 degenerative endplate marrow signal changes at L3-4 and L4-5. 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  Conus medullaris and cauda equina: Conus extends to the L1 level. Conus and cauda equina appear normal.  Paraspinal and other soft tissues: Unremarkable.  Disc levels:  T12-L1:  Normal.  L1-L2: Small disc bulge and mild bilateral facet arthropathy. No spinal canal stenosis or neural foraminal narrowing.  L2-L3: Disc bulge and facet arthropathy results mild spinal canal stenosis, unchanged.  L3-L4: Right eccentric disc bulge and facet arthropathy results moderate narrowing of the right subarticular zone with displacement of the traversing right L4 nerve root, new from prior.  L4-L5: Disc bulge and right-greater-than-left facet arthropathy results in moderate spinal canal stenosis, severe right and moderate left neural foraminal narrowing, unchanged.  L5-S1: Small disc bulge without spinal canal stenosis or neural foraminal narrowing.  IMPRESSION:  1. 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 2. Moderate narrowing of the right subarticular zone at L3-4 with displacement of the traversing right L4 nerve root, new from prior.   Electronically Signed By: Orvan Falconer M.D. On: 04/26/2023 14:48   Complexity Note: Imaging results reviewed.                         ROS  Cardiovascular: Heart trouble, Abnormal heart rhythm, Pacemaker or defibrillator, Weak heart (CHF), Heart valve problems, and Blood thinners:  Antiplatelet Pulmonary or Respiratory: No reported pulmonary signs or symptoms such as wheezing and difficulty taking a deep full breath (Asthma), difficulty blowing air out (Emphysema), coughing up mucus (Bronchitis), persistent dry cough, or temporary stoppage of breathing during sleep Neurological: No reported neurological signs or symptoms such as seizures, abnormal skin sensations, urinary and/or fecal incontinence, being born with an abnormal open spine and/or a tethered spinal cord Psychological-Psychiatric: No reported psychological or psychiatric signs or symptoms such as difficulty sleeping, anxiety, depression, delusions or hallucinations (schizophrenial), mood swings (bipolar disorders) or suicidal ideations or attempts Gastrointestinal: No reported gastrointestinal signs or symptoms such as vomiting or evacuating blood, reflux, heartburn, alternating episodes of diarrhea and constipation, inflamed or scarred liver, or pancreas or irrregular and/or infrequent bowel movements Genitourinary: No reported renal or genitourinary signs or symptoms such as difficulty voiding or producing urine, peeing blood, non-functioning kidney, kidney stones, difficulty emptying the bladder, difficulty controlling the flow of urine, or chronic kidney disease Hematological: No reported hematological signs or symptoms such as prolonged bleeding, low or poor functioning  platelets, bruising or bleeding easily, hereditary bleeding problems, low energy levels due to low hemoglobin or being anemic Endocrine: No reported endocrine signs or symptoms such as high or low blood sugar, rapid heart rate due to high thyroid levels, obesity or weight gain due to slow thyroid or thyroid disease Rheumatologic: No reported rheumatological signs and symptoms such as fatigue, joint pain, tenderness, swelling, redness, heat, stiffness, decreased range of motion, with or without associated rash Musculoskeletal: Negative for myasthenia gravis, muscular dystrophy, multiple sclerosis or malignant hyperthermia Work History: Retired  Allergies  Mr. Loffredo has No Known Allergies.  Laboratory Chemistry Profile   Renal Lab Results  Component Value Date   BUN 21 02/09/2023   CREATININE 1.10 02/09/2023   BCR 19 02/09/2023   GFRNONAA >60 05/28/2022   PROTEINUR NEGATIVE 05/28/2022     Electrolytes Lab Results  Component Value Date   NA 134 02/09/2023   K 4.0 02/09/2023   CL 92 (L) 02/09/2023   CALCIUM 10.2 02/09/2023   MG 2.0 07/29/2022     Hepatic Lab Results  Component Value Date   AST 26 07/29/2022   ALT 28 07/29/2022   ALBUMIN 4.6 07/29/2022   ALKPHOS 66 07/29/2022     ID Lab Results  Component Value Date   HIV Non Reactive 05/26/2022   SARSCOV2NAA NEGATIVE 05/25/2022     Bone Lab Results  Component Value Date   TESTOSTERONE 808 05/10/2023     Endocrine Lab Results  Component Value Date   GLUCOSE 170 (H) 02/09/2023   GLUCOSEU 50 (A) 05/28/2022   HGBA1C 6.0 05/07/2023   TSH 1.030 02/09/2023   TESTOSTERONE 808 05/10/2023     Neuropathy Lab Results  Component Value Date   VITAMINB12 529 10/05/2022  FOLATE 3.7 10/05/2022   HGBA1C 6.0 05/07/2023   HIV Non Reactive 05/26/2022     CNS No results found for: "COLORCSF", "APPEARCSF", "RBCCOUNTCSF", "WBCCSF", "POLYSCSF", "LYMPHSCSF", "EOSCSF", "PROTEINCSF", "GLUCCSF", "JCVIRUS", "CSFOLI", "IGGCSF",  "LABACHR", "ACETBL"   Inflammation (CRP: Acute  ESR: Chronic) No results found for: "CRP", "ESRSEDRATE", "LATICACIDVEN"   Rheumatology No results found for: "RF", "ANA", "LABURIC", "URICUR", "LYMEIGGIGMAB", "LYMEABIGMQN", "HLAB27"   Coagulation Lab Results  Component Value Date   INR 1.1 05/25/2022   LABPROT 14.1 05/25/2022   APTT 27 05/25/2022   PLT 270 02/09/2023   DDIMER 0.49 02/09/2023     Cardiovascular Lab Results  Component Value Date   BNP 79.2 05/25/2022   HGB 14.1 02/09/2023   HCT 41.2 02/09/2023     Screening Lab Results  Component Value Date   SARSCOV2NAA NEGATIVE 05/25/2022   HIV Non Reactive 05/26/2022     Cancer No results found for: "CEA", "CA125", "LABCA2"   Allergens No results found for: "ALMOND", "APPLE", "ASPARAGUS", "AVOCADO", "BANANA", "BARLEY", "BASIL", "BAYLEAF", "GREENBEAN", "LIMABEAN", "WHITEBEAN", "BEEFIGE", "REDBEET", "BLUEBERRY", "BROCCOLI", "CABBAGE", "MELON", "CARROT", "CASEIN", "CASHEWNUT", "CAULIFLOWER", "CELERY"     Note: Lab results reviewed.  PFSH  Drug: Mr. Summerson  reports no history of drug use. Alcohol:  reports current alcohol use. Tobacco:  reports that he has been smoking cigarettes. He has been exposed to tobacco smoke. He has never used smokeless tobacco. Medical:  has a past medical history of Alcohol abuse, Arthritis, Atrial flutter (HCC), Cardiac sarcoidosis, Chronic pain, Diabetes mellitus without complication (HCC), Hyperlipidemia, Hypertension, Obesity, Peripheral polyneuropathy, Tobacco abuse, and Venous insufficiency. Family: family history includes Heart attack in his father.  Past Surgical History:  Procedure Laterality Date   CARDIAC CATHETERIZATION  2019   clean cors   CARPAL TUNNEL RELEASE Left    PACEMAKER IMPLANT N/A 08/17/2022   Procedure: PACEMAKER IMPLANT;  Surgeon: Marinus Maw, MD;  Location: [facility];  Service: Cardiovascular;  Laterality: N/A;   Active Ambulatory Problems    Diagnosis  Date Noted   Essential hypertension 04/20/2017   Hyperlipidemia LDL goal <70 05/25/2022   Second degree AV block 05/25/2022   Tobacco use 02/28/2018   Atrial flutter (HCC) 09/04/2022   Obesity (BMI 30-39.9) 09/04/2022   Diabetes mellitus (HCC) 09/04/2022   Venous insufficiency 09/04/2022   Peripheral polyneuropathy 10/05/2022   Lupus anticoagulant disorder (HCC) 10/05/2022   Chronic pain syndrome 11/11/2022   Pacemaker 11/24/2022   Hypogonadism in male 12/04/2022   Syncope 12/08/2022   Chronic bilateral low back pain with bilateral sciatica 01/10/2023   Persistent atrial fibrillation (HCC) 02/26/2023   Coronary artery disease involving native coronary artery of native heart without angina pectoris 02/26/2023   LVH (left ventricular hypertrophy) 02/26/2023   Cervical radicular pain 05/24/2023   Foraminal stenosis of cervical region 05/24/2023   Lumbar facet arthropathy 05/24/2023   Resolved Ambulatory Problems    Diagnosis Date Noted   Acute pulmonary embolism without acute cor pulmonale (HCC) 05/25/2022   Hypokalemia 05/25/2022   Hypomagnesemia 05/25/2022   Encounter to establish care 09/04/2022   Cancer screening 09/04/2022   Head trauma 12/08/2022   Past Medical History:  Diagnosis Date   Alcohol abuse    Arthritis    Cardiac sarcoidosis    Chronic pain    Diabetes mellitus without complication (HCC)    Hyperlipidemia    Hypertension    Obesity    Tobacco abuse    Constitutional Exam  General appearance: Well nourished, well developed, and well hydrated.  In no apparent acute distress Vitals:   05/24/23 1429  BP: (!) 183/95  Pulse: 60  Resp: 16  Temp: (!) 97.2 F (36.2 C)  TempSrc: Temporal  SpO2: 99%  Weight: 285 lb (129.3 kg)  Height: 6\' 1"  (1.854 m)   BMI Assessment: Estimated body mass index is 37.6 kg/m as calculated from the following:   Height as of this encounter: 6\' 1"  (1.854 m).   Weight as of this encounter: 285 lb (129.3 kg).  BMI  interpretation table: BMI level Category Range association with higher incidence of chronic pain  <18 kg/m2 Underweight   18.5-24.9 kg/m2 Ideal body weight   25-29.9 kg/m2 Overweight Increased incidence by 20%  30-34.9 kg/m2 Obese (Class I) Increased incidence by 68%  35-39.9 kg/m2 Severe obesity (Class II) Increased incidence by 136%  >40 kg/m2 Extreme obesity (Class III) Increased incidence by 254%   Patient's current BMI Ideal Body weight  Body mass index is 37.6 kg/m. Ideal body weight: 79.9 kg (176 lb 2.4 oz) Adjusted ideal body weight: 99.6 kg (219 lb 11 oz)   BMI Readings from Last 4 Encounters:  05/24/23 37.60 kg/m  05/07/23 32.32 kg/m  04/27/23 38.02 kg/m  04/21/23 36.57 kg/m   Wt Readings from Last 4 Encounters:  05/24/23 285 lb (129.3 kg)  05/07/23 245 lb (111.1 kg)  04/27/23 288 lb 3.2 oz (130.7 kg)  04/21/23 277 lb 3.2 oz (125.7 kg)    Psych/Mental status: Alert, oriented x 3 (person, place, & time)       Eyes: PERLA Respiratory: No evidence of acute respiratory distress  Cervical Spine Area Exam  Skin & Axial Inspection: No masses, redness, edema, swelling, or associated skin lesions Alignment: Symmetrical Functional ROM: Pain restricted ROM      Stability: No instability detected Muscle Tone/Strength: Functionally intact. No obvious neuro-muscular anomalies detected. Sensory (Neurological): Dermatomal pain pattern Palpation: No palpable anomalies             Upper Extremity (UE) Exam    Side: Right upper extremity  Side: Left upper extremity  Skin & Extremity Inspection: Skin color, temperature, and hair growth are WNL. No peripheral edema or cyanosis. No masses, redness, swelling, asymmetry, or associated skin lesions. No contractures.  Skin & Extremity Inspection: Skin color, temperature, and hair growth are WNL. No peripheral edema or cyanosis. No masses, redness, swelling, asymmetry, or associated skin lesions. No contractures.  Functional ROM:  Unrestricted ROM          Functional ROM: Unrestricted ROM          Muscle Tone/Strength: Functionally intact. No obvious neuro-muscular anomalies detected.  Muscle Tone/Strength: Functionally intact. No obvious neuro-muscular anomalies detected.  Sensory (Neurological): Neurogenic pain pattern          Sensory (Neurological): Neurogenic pain pattern          Palpation: No palpable anomalies              Palpation: No palpable anomalies              Provocative Test(s):  Phalen's test: deferred Tinel's test: deferred Apley's scratch test (touch opposite shoulder):  Action 1 (Across chest): Decreased ROM Action 2 (Overhead): Decreased ROM Action 3 (LB reach): Decreased ROM   Provocative Test(s):  Phalen's test: deferred Tinel's test: deferred Apley's scratch test (touch opposite shoulder):  Action 1 (Across chest): Decreased ROM Action 2 (Overhead): Decreased ROM Action 3 (LB reach): Decreased ROM    Thoracic Spine Area Exam  Skin &  Axial Inspection: No masses, redness, or swelling Alignment: Symmetrical Functional ROM: Unrestricted ROM Stability: No instability detected Muscle Tone/Strength: Functionally intact. No obvious neuro-muscular anomalies detected. Sensory (Neurological): Unimpaired Muscle strength & Tone: No palpable anomalies Lumbar Spine Area Exam  Skin & Axial Inspection: No masses, redness, or swelling Alignment: Symmetrical Functional ROM: Pain restricted ROM       Stability: No instability detected Muscle Tone/Strength: Functionally intact. No obvious neuro-muscular anomalies detected. Sensory (Neurological): Dermatomal pain pattern  Gait & Posture Assessment  Ambulation: Unassisted Gait: Relatively normal for age and body habitus Posture: WNL  Lower Extremity Exam    Side: Right lower extremity  Side: Left lower extremity  Stability: No instability observed          Stability: No instability observed          Skin & Extremity Inspection: Skin color,  temperature, and hair growth are WNL. No peripheral edema or cyanosis. No masses, redness, swelling, asymmetry, or associated skin lesions. No contractures.  Skin & Extremity Inspection: Skin color, temperature, and hair growth are WNL. No peripheral edema or cyanosis. No masses, redness, swelling, asymmetry, or associated skin lesions. No contractures.  Functional ROM: Unrestricted ROM                  Functional ROM: Unrestricted ROM                  Muscle Tone/Strength: Functionally intact. No obvious neuro-muscular anomalies detected.  Muscle Tone/Strength: Functionally intact. No obvious neuro-muscular anomalies detected.  Sensory (Neurological): Unimpaired        Sensory (Neurological): Unimpaired        DTR: Patellar: deferred today Achilles: deferred today Plantar: deferred today  DTR: Patellar: deferred today Achilles: deferred today Plantar: deferred today  Palpation: No palpable anomalies  Palpation: No palpable anomalies    Assessment  Primary Diagnosis & Pertinent Problem List: The primary encounter diagnosis was Cervical radicular pain. Diagnoses of Foraminal stenosis of cervical region, Lumbar spondylosis, Lumbar facet arthropathy, and Chronic pain syndrome were also pertinent to this visit.  Visit Diagnosis (New problems to examiner): 1. Cervical radicular pain   2. Foraminal stenosis of cervical region   3. Lumbar spondylosis   4. Lumbar facet arthropathy   5. 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 An MRI revealed disc herniation at C5-C6 with disc space loss, causing pain radiating into his arms and numbness in his hands. Although a previous cervical epidural (many years ago by another provider) provided relief, his symptoms have returned. We discussed the benefits and risks of a cervical epidural injection  with steroid medication to decrease inflammation, including the potential need for multiple injections and risks such as infection, bleeding, and a temporary increase in pain. The procedure, performed under x-ray guidance, offers significant potential for pain relief and improved function.  We will schedule a cervical epidural injection with steroid medication and coordinate with his cardiologist to stop Xarelto 3 days prior to the procedure, substituting it with baby aspirin. We will also check for cancellations to expedite scheduling.  Low Back with radiation into bilateral legs pain due to lumbar facet arthropathy, lumbar spinal stenosis, lumbar foraminal stenosis Chronic axial low back pain.  He has multilevel disc herniations with a right eccentric disc bulge at L3-L4 resulting in moderate narrowing  of the right subarticular zone with displacement of the right L4 nerve root.  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  We discussed diagnostic lumbar epidural steroid injection in the future.  Patient states that he would like to address his neck and arm pain first.  General Health Maintenance   Given his use of Xarelto and a pacemaker, blood thinner management is essential for procedures. We will coordinate with his cardiologist for blood thinner management and use baby aspirin during Xarelto discontinuation.  Follow-up   We will ensure timely communication regarding the procedure scheduling and have a nurse contact the cardiologist for blood thinner management.        Procedure Orders         Cervical Epidural Injection     Interventional management options: Mr. Doub was informed that there is no guarantee that he would be a candidate for interventional therapies. The decision will be based on the results of diagnostic studies, as well as Mr. Freimark risk profile.  Procedure(s) under  consideration:  Cervical epidural steroid injection Lumbar epidural steroid injection Diagnostic lumbar facet medial branch nerve blocks   Provider-requested follow-up: Return in about 3 weeks (around 06/14/2023) for C-ESI, in clinic [facility].  Future Appointments  Date Time Provider Department Center  05/28/2023  8:50 AM Tereso Newcomer T, PA-C [facility]  06/01/2023 11:45 AM Jenelle Mages, PT [facility]  06/04/2023 10:15 AM Ashley Murrain, PT [facility]  06/08/2023 11:00 AM Barry Dienes [facility]  06/10/2023 11:45 AM Ashley Murrain, PT [facility]  06/15/2023 11:00 AM Barry Dienes [facility]  06/17/2023 11:45 AM Ashley Murrain, PT [facility]  06/18/2023  9:00 AM Christopher Leach, PA-C [facility] None  08/10/2023  9:30 AM Christopher Kitty, MD [facility] None  08/17/2023  7:00 AM [facility] DEVICE REMOTES [facility]  11/16/2023  7:00 AM [facility] DEVICE REMOTES [facility]  02/15/2024  7:00 AM [facility] DEVICE REMOTES [facility]  05/16/2024  7:00 AM [facility] DEVICE REMOTES [facility]  08/15/2024  7:00 AM [facility] DEVICE REMOTES [facility]    Duration of encounter: .  Total time on encounter, as per AMA guidelines included both the face-to-face and non-face-to-face time personally spent by the physician and/or other qualified health care professional(s) on the day of the encounter (includes time in activities that require the physician or other qualified health care professional and does not include time in activities normally performed by clinical staff). Physician's time may include the following activities when performed: Preparing to see the patient (e.g., pre-charting review of records, searching for previously ordered imaging, lab work, and nerve conduction tests) Review of prior analgesic pharmacotherapies. Reviewing PMP Interpreting ordered tests (e.g., lab  work, imaging, nerve conduction tests) Performing post-procedure evaluations, including interpretation of diagnostic procedures Obtaining and/or reviewing separately obtained history Performing a medically appropriate examination and/or evaluation Counseling and educating the patient/family/caregiver Ordering medications, tests, or procedures Referring and communicating with other health care professionals (when not separately reported) Documenting clinical information in the electronic or other health record Independently interpreting results (not separately reported) and communicating results to the patient/ family/caregiver Care coordination (not separately reported)  Note by: Christopher Jolly, MD (AI and TTS technology used. I apologize for any typographical errors that were not detected and corrected.) Date: 05/24/2023; Time: 3:42 PM

## 2023-05-24 NOTE — Patient Instructions (Addendum)
Please contact Sharrell Ku (Cardiology) to get cardiac clearance to stop Xarelto 3 days prior to C-ESI, ok to take ASA 81 mg   ______________________________________________________________________    Procedure instructions  Stop blood-thinners  Do not eat or drink fluids (other than water) for 6 hours before your procedure  No water for 2 hours before your procedure  Take your blood pressure medicine with a sip of water  Arrive 30 minutes before your appointment  If sedation is planned, bring suitable driver. Pennie Banter, Benedetto Goad, & public transportation are NOT APPROVED)  Carefully read the "Preparing for your procedure" detailed instructions  If you have questions call us at [phone]  ______________________________________________________________________

## 2023-05-25 ENCOUNTER — Other Ambulatory Visit: Payer: Self-pay | Admitting: Family Medicine

## 2023-05-25 ENCOUNTER — Telehealth: Payer: Self-pay

## 2023-05-25 ENCOUNTER — Encounter: Payer: Self-pay | Admitting: Physical Therapy

## 2023-05-25 ENCOUNTER — Encounter: Payer: BC Managed Care – PPO | Admitting: Internal Medicine

## 2023-05-25 MED ORDER — PREGABALIN 300 MG PO CAPS: 300.0000 mg | ORAL_CAPSULE | Freq: Three times a day (TID) | ORAL | 2 refills | Status: DC

## 2023-05-25 NOTE — Telephone Encounter (Signed)
Copied from CRM [id number]. Topic: Clinical - Medication Refill >> May 25, 2023  2:23 PM Lorin Glass B wrote: Most Recent Primary Care Visit:  Provider: [facility]  Department: [facility]  Visit Type: LAB  Date: 05/10/2023  Medication: Pregabalin 300mg , 3x a day  Has the patient contacted their pharmacy? No, says it can't be refilled (Agent: If no, request that the patient contact the pharmacy for the refill. If patient does not wish to contact the pharmacy document the reason why and proceed with request.) (Agent: If yes, when and what did the pharmacy advise?)  Is this the correct pharmacy for this prescription? Yes If no, delete pharmacy and type the correct one.   This is the patient's preferred pharmacy:   [facility] - [place], [place] - [address] [place] [address] Phone: [phone] Fax: [phone]   Has the prescription been filled recently? No  Is the patient out of the medication? No  Has the patient been seen for an appointment in the last year OR does the patient have an upcoming appointment? Yes  Can we respond through MyChart? Yes  Agent: Please be advised that Rx refills may take up to 3 business days. We ask that you follow-up with your pharmacy.

## 2023-05-25 NOTE — Telephone Encounter (Signed)
...     Pre-operative Risk Assessment    Patient Name: Christopher Green  DOB: [birth year] MRN: [id number]      Request for Surgical Clearance    Procedure:   C- ESI  Date of Surgery:  Clearance TBD                                 Surgeon:  Jaynie Bream Group or Practice Name:  [facility] Phone number:  [phone] Fax number:  [phone]   Type of Clearance Requested:   - Medical  - Pharmacy:  Hold Rivaroxaban (Xarelto) HOLD FOR 3 DAYS   Type of Anesthesia:  Not Indicated   Additional requests/questions:   LAST  O/V 04/21/23, NEXT APPT 05/28/23  Signed, Renee Ramus   05/25/2023, 1:26 PM

## 2023-05-26 ENCOUNTER — Telehealth: Payer: Self-pay

## 2023-05-26 DIAGNOSIS — I10 Essential (primary) hypertension: Secondary | ICD-10-CM

## 2023-05-26 DIAGNOSIS — D8685 Sarcoid myocarditis: Secondary | ICD-10-CM

## 2023-05-26 NOTE — Telephone Encounter (Signed)
-----   Message from Laurann Montana sent at 05/21/2023  8:09 AM EST ----- Triage - please let pt know that his PET scan was positive for a condition called sarcoidosis, where tiny collections of immune cells form granulomas in the heart tissue and can interfere with normal functioning. This may help unify why he required a pacemaker so young as well as perhaps why he has terrible issues with neuropathy and atrial fib/flutter. I discussed with Dr. Shirlee Latch with Advanced HF team. Please arrange referral to the Advanced Heart Failure clinic for diagnosis of cardiac sarcoidosis, obtain high resolution CT scan of the chest to evaluate for presence of pulmonary sarcoidosis, and obtain ACE level (labwork). His EF/pumping function has decreased by PET compared to previous echo then cMRI, will see what modality of re-evaluation heart failure team suggests.   I will cc to patient's PCP, his EP team, primary cardiologist, and Tereso Newcomer as FYI - keep f/u with [facility] 12/13.

## 2023-05-26 NOTE — Telephone Encounter (Signed)
The patient has been notified of the result and verbalized understanding.  All questions (if any) were answered. Frutoso Schatz, RN 05/26/2023 4:24 PM   CT scan and labs have been ordered. Referral has been placed for CHF clinic.

## 2023-05-27 NOTE — Progress Notes (Unsigned)
Cardiology Office Note:    Date:  05/27/2023  ID:  Christopher Green, DOB [birth year], MRN [id number] PCP: Sandre Kitty, MD  [facility] Providers Cardiologist:  Thomasene Ripple, DO Electrophysiologist:  Lewayne Bunting, MD { Click to update primary MD,subspecialty MD or APP then REFRESH:1}    {Click to Open Review  :1}   Patient Profile:     *** Coronary artery disease, nonobstructive LHC 03/25/2018 ([facility]): Proximal LAD 20, mid LAD 20, Dx 20, proximal LCx 20, proximal RCA 20 High grade heart block s/p pacemaker  Atrial tachycardia Atrial flutter/?atrial fibrillation  Syncope 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  TTE 02/12/2023: EF 60-65, no RWMA, severe LVH, normal RVSF, trivial MR, mild AI, AV sclerosis, RAP 3 CMR 04/12/23: EF 46 (free breathing artifact), severe LVH of LV septum, +multifocal LGE  w increased ECV signal not present in areas of LVH - consider inflammatory CM  FDG-PET 05/19/23: FDG uptake c/w myocardial inflammation/sarcoidosis, EF 36, +Cor Ca2+ Hypertension  Hyperlipidemia  Diabetes mellitus  Tobacco use ETOH abuse  Hx of DVT/pulmonary embolism in 05/2022  Antiphospholipid antibody syndrome - chronic anticoagulation    Pt had an episode of syncope in 11/2022 resulting in facial fractures with orbital blowout fx. He was seen in 01/2023 and reported multiple recurrent episodes of syncope. He had evidence of ortho hypotension. He stopped Valsartan/hydrochlorothiazide and syncope resolved. He had AF/Flutter on device interrogation but no cause for syncope. He has severe LVH on TTE. CMR in 03/2023 showed severe septal LVH and multifocal LGE suspicious for inflammatory CM. FDG-PET was done 05/2023 and was c/w sarcoid. High Res CT Chest and ACE level are pending. He has been referred to CHF Clinic.        {      :1}   History of Present Illness:  Discussed the use of AI scribe software for clinical note transcription with the patient, who gave verbal  consent to proceed.  Christopher Green is a 52 y.o. male who returns for surgical clearance. He was last seen in 02/2023. Since that time, CMR was suspicious for inflammatory CM. FDG-PET was c/w sarcoid. High Res Chest CT and ACE level are pending. He has been referred to CHF Clinic. He needs cervical ESI with [facility]. Xarelto needs to be held for 3 days. He has antiphospholipid antibody syndrome. Our PharmD has asked his hematologist (Dr. Pamelia Hoit) to approve holding Xarelto for 3 days prior to his procedure.           ROS   See HPI ***    Studies Reviewed:       *** Results          Risk Assessment/Calculations:   {Does this patient have ATRIAL FIBRILLATION?:[phone]} No BP recorded.  {Refresh Note OR Click here to enter BP  :1}***       Physical Exam:   VS:  There were no vitals taken for this visit.   Wt Readings from Last 3 Encounters:  05/24/23 285 lb (129.3 kg)  05/07/23 245 lb (111.1 kg)  04/27/23 288 lb 3.2 oz (130.7 kg)    Physical Exam***     Assessment and Plan:   Assessment & Plan Preoperative cardiovascular examination  LVH (left ventricular hypertrophy)  Persistent atrial fibrillation (HCC)  Second degree AV block  Coronary artery disease involving native coronary artery of native heart without angina pectoris   Assessment and Plan             {  Syncope No recurrence since discontinuation of Valsartan/Hydrochlorothiazide. Likely related to hypotension. -No further testing needed.   Hypertension Blood pressure running high since discontinuation of Valsartan/Hydrochlorothiazide.  However, he has not had any medications yet today. -Continue Carvedilol 3.125mg  twice daily, Hydralazine 100mg  twice daily, Spironolactone 12.5mg  daily. -Monitor blood pressure over the next week and send readings for review via MyChart. -If blood pressure remains above target, increase Carvedilol to 6.25mg  twice daily.   Atrial Fibrillation/Flutter Noted on pacer  interrogation.  He has been in persistent atrial fibrillation since March 2024. -Continue Xarelto 20mg  daily and Carvedilol 3.125mg  twice daily. -Follow up with Electrophysiology as planned. -I will review with Dr. Ladona Ridgel see if we should consider cardioversion   Coronary Artery Disease Non-obstructive as per cardiac catheterization in 2019. No symptoms of chest pain to suggest angina. -Continue Lipitor 40mg  daily. -He is not on antiplatelet therapy as he is on Xarelto.   Hyperlipidemia -Continue Lipitor 40mg  daily.   Hypercoagulable State (History of DVT, Pulmonary Embolism, and Antiphospholipid Antibody Syndrome) On long-term anticoagulation with Xarelto 20mg  daily. -Continue current management.   Left Ventricular Hypertrophy Severe LVH noted on echocardiogram. Mild aortic insufficiency. -Cardiac MRI is currently pending to further evaluate.   Tobacco Use -Recommended cessation.   High Grade Heart Block Status post pacemaker. -Follow up with Electrophysiology as planned.     :1}    {Are you ordering a CV Procedure (e.g. stress test, cath, DCCV, TEE, etc)?   Press F2        :[id number]}  Dispo:  No follow-ups on file.  Signed, Tereso Newcomer, PA-C

## 2023-05-27 NOTE — Telephone Encounter (Signed)
Patient with diagnosis of afib and antiphospholipid antibody syndrome on Xarelto for anticoagulation.    Procedure: C- ESI  Date of procedure: TBD   CHA2DS2-VASc Score = 5   This indicates a 7.2% annual risk of stroke. The patient's score is based upon: CHF History: 0 HTN History: 1 Diabetes History: 1 Stroke History: 2 Vascular Disease History: 1 Age Score: 0 Gender Score: 0      CrCl 108 ml/min Platelet count 270  Patient would need to hold Xarelto for 3 days for spinal procedure. Due to his antiphospholipid antibody syndrome, I will defer to Dr. Pamelia Hoit as to if this is ok.  **This guidance is not considered finalized until pre-operative APP has relayed final recommendations.**

## 2023-05-27 NOTE — Telephone Encounter (Signed)
   Name: JADEYN REMEDIOS  DOB: 01/29/1971  MRN: [id number]  Primary Cardiologist: Thomasene Ripple, DO  Chart reviewed as part of pre-operative protocol coverage. The patient has an upcoming visit scheduled with Tereso Newcomer on 05/28/2023 at which time clearance can be addressed in case there are any issues that would impact surgical recommendations.  C-ESI Is not scheduled until TBD as below. I added preop FYI to appointment note so that provider is aware to address at time of outpatient visit.  Per office protocol the cardiology provider should forward their finalized clearance decision and recommendations regarding antiplatelet therapy to the requesting party below.    Patient would need to hold Xarelto for 3 days for spinal procedure. Due to his antiphospholipid antibody syndrome, I will defer to Dr. Pamelia Hoit as to if this is ok.   I will route this message as FYI to requesting party and remove this message from the preop box as separate preop APP input not needed at this time.   Please call with any questions.  Joni Reining, NP  05/27/2023, 1:10 PM

## 2023-05-28 ENCOUNTER — Ambulatory Visit: Payer: BC Managed Care – PPO | Admitting: Physician Assistant

## 2023-05-28 ENCOUNTER — Encounter (HOSPITAL_COMMUNITY): Payer: Self-pay | Admitting: Cardiology

## 2023-05-28 ENCOUNTER — Ambulatory Visit (HOSPITAL_COMMUNITY)
Admission: RE | Admit: 2023-05-28 | Discharge: 2023-05-28 | Disposition: A | Discharge status: Home / Self Care | Payer: BC Managed Care – PPO | Source: Ambulatory Visit | Attending: Cardiology | Admitting: Cardiology

## 2023-05-28 ENCOUNTER — Ambulatory Visit: Payer: BC Managed Care – PPO | Admitting: Cardiology

## 2023-05-28 VITALS — BP 210/102 | HR 84 | Ht 73.0 in | Wt 306.6 lb

## 2023-05-28 DIAGNOSIS — Z01810 Encounter for preprocedural cardiovascular examination: Secondary | ICD-10-CM

## 2023-05-28 DIAGNOSIS — I5022 Chronic systolic (congestive) heart failure: Secondary | ICD-10-CM

## 2023-05-28 DIAGNOSIS — Z86711 Personal history of pulmonary embolism: Secondary | ICD-10-CM | POA: Diagnosis not present

## 2023-05-28 DIAGNOSIS — I441 Atrioventricular block, second degree: Secondary | ICD-10-CM

## 2023-05-28 DIAGNOSIS — Z7984 Long term (current) use of oral hypoglycemic drugs: Secondary | ICD-10-CM | POA: Insufficient documentation

## 2023-05-28 DIAGNOSIS — I110 Hypertensive heart disease with heart failure: Secondary | ICD-10-CM | POA: Insufficient documentation

## 2023-05-28 DIAGNOSIS — E119 Type 2 diabetes mellitus without complications: Secondary | ICD-10-CM | POA: Insufficient documentation

## 2023-05-28 DIAGNOSIS — Z950 Presence of cardiac pacemaker: Secondary | ICD-10-CM | POA: Diagnosis not present

## 2023-05-28 DIAGNOSIS — I2510 Atherosclerotic heart disease of native coronary artery without angina pectoris: Secondary | ICD-10-CM | POA: Diagnosis not present

## 2023-05-28 DIAGNOSIS — Z86718 Personal history of other venous thrombosis and embolism: Secondary | ICD-10-CM | POA: Diagnosis not present

## 2023-05-28 DIAGNOSIS — I4819 Other persistent atrial fibrillation: Secondary | ICD-10-CM | POA: Diagnosis not present

## 2023-05-28 DIAGNOSIS — Z79899 Other long term (current) drug therapy: Secondary | ICD-10-CM | POA: Insufficient documentation

## 2023-05-28 DIAGNOSIS — Z7901 Long term (current) use of anticoagulants: Secondary | ICD-10-CM | POA: Insufficient documentation

## 2023-05-28 DIAGNOSIS — Z7985 Long-term (current) use of injectable non-insulin antidiabetic drugs: Secondary | ICD-10-CM | POA: Insufficient documentation

## 2023-05-28 DIAGNOSIS — I5020 Unspecified systolic (congestive) heart failure: Secondary | ICD-10-CM | POA: Diagnosis not present

## 2023-05-28 DIAGNOSIS — E785 Hyperlipidemia, unspecified: Secondary | ICD-10-CM | POA: Diagnosis not present

## 2023-05-28 DIAGNOSIS — I517 Cardiomegaly: Secondary | ICD-10-CM

## 2023-05-28 DIAGNOSIS — D8685 Sarcoid myocarditis: Secondary | ICD-10-CM | POA: Diagnosis not present

## 2023-05-28 DIAGNOSIS — I4892 Unspecified atrial flutter: Secondary | ICD-10-CM | POA: Insufficient documentation

## 2023-05-28 LAB — BRAIN NATRIURETIC PEPTIDE: B Natriuretic Peptide: 116.4 pg/mL — ABNORMAL HIGH (ref 0.0–100.0)

## 2023-05-28 LAB — BASIC METABOLIC PANEL
Anion gap: 5 (ref 5–15)
BUN: 10 mg/dL (ref 6–20)
CO2: 34 mmol/L — ABNORMAL HIGH (ref 22–32)
Calcium: 9.5 mg/dL (ref 8.9–10.3)
Chloride: 103 mmol/L (ref 98–111)
Creatinine, Ser: 0.87 mg/dL (ref 0.61–1.24)
GFR, Estimated: >60 mL/min (ref >60)
Glucose, Bld: 126 mg/dL — ABNORMAL HIGH (ref 70–99)
Potassium: 3.8 mmol/L (ref 3.5–5.1)
Sodium: 142 mmol/L (ref 135–145)

## 2023-05-28 MED ORDER — HYDRALAZINE HCL 100 MG PO TABS: 50.0000 mg | ORAL_TABLET | Freq: Two times a day (BID) | ORAL | 3 refills | Status: DC

## 2023-05-28 MED ORDER — LOSARTAN POTASSIUM 50 MG PO TABS: 50.0000 mg | ORAL_TABLET | Freq: Every day | ORAL | 3 refills | Status: DC

## 2023-05-28 NOTE — Progress Notes (Signed)
Blood collected for HCM genetic testing per Dr Gasper Lloyd.  Order form completed and both shipped by [facility] to [facility].

## 2023-05-28 NOTE — Patient Instructions (Signed)
Medication Changes:  DECREASE HYDRALAZINE TO 50MG  TWICE DAILY   START: LOSARTAN 50MG  ONCE DAILY   Lab Work:  Labs done today, your results will be available in MyChart, we will contact you for abnormal readings.  Testing/Procedures:  Genetic test has been done, this has to be sent to [place] to be processed and can take 1-2 weeks to get results back.  We will let you know the results.  Follow-Up in: 1 MONTH AS SCHEDULED WITH APP    AND THEN AGAIN IN 2 MONTHS WITH DR. Gasper Lloyd AS SCHEDULED   At the [facility], you and your health needs are our priority. We have a designated team specialized in the treatment of Heart Failure. This Care Team includes your primary Heart Failure Specialized Cardiologist (physician), Advanced Practice Providers (APPs- Physician Assistants and Nurse Practitioners), and Pharmacist who all work together to provide you with the care you need, when you need it.   You may see any of the following providers on your designated Care Team at your next follow up:  Dr. Arvilla Meres Dr. Marca Ancona Dr. Dorthula Nettles Dr. Theresia Bough Tonye Becket, NP Robbie Lis, [place] [facility] [place], [place] Brynda Peon, NP [place] Lee, NP Karle Plumber, PharmD   Please be sure to bring in all your medications bottles to every appointment.   Need to Contact us:  If you have any questions or concerns before your next appointment please send [place] a message through Hope or call our office at [phone].    TO LEAVE A MESSAGE FOR THE NURSE SELECT OPTION 2, PLEASE LEAVE A MESSAGE INCLUDING: YOUR NAME DATE OF BIRTH CALL BACK NUMBER REASON FOR CALL**this is important as we prioritize the call backs  YOU WILL RECEIVE A CALL BACK THE SAME DAY AS LONG AS YOU CALL BEFORE 4:00 PM

## 2023-05-28 NOTE — Progress Notes (Signed)
ADVANCED HEART FAILURE CLINIC NOTE  Referring Physician: Sandre Kitty, MD  Primary Care: Sandre Kitty, MD Primary Cardiologist: Dr. Servando Salina HF: Dr. 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  During that time he was also seen for uncontrolled hypertension in the emergency department.  He was not seen by [facility] health cardiology until December 2023 when he presented to [facility] with complaints of shortness of breath and a systolic blood pressure over 200 and heart rate of 40.  CTA PE on admission was significant for acute segmental and subsegmental pulmonary emboli.  While admitted patient was also significantly bradycardic with Mobitz 2 heart block.  Echocardiogram during admission with LVEF of 65 to 70%.  Due to persistent symptoms of dyspnea and 2-1 AV block with right bundle branch block on EKG he underwent placement of permanent pacemaker by Dr. Sharlot Gowda in 2024.  Despite placement of permanent pacemaker he had multiple episodes of syncope in August 2024.  Device interrogation by EP with atrial fibrillation with controlled ventricular rates and atrial flutter.  During this time he also had intermittent low blood pressures that were associated with his syncope but etiology could not be determined.  He subsequently underwent cardiac MRI on 10/24 which demonstrated severe LVH of the septum with multifocal LGE that was not present in the areas of hypertrophy.  Patient subsequently sent to heart failure clinic for further evaluation.  From a symptomatic  standpoint, Christopher Green reports that he attempts to remain fairly active.  He continues to work around his property.  He reports that he has not had any recent syncopal episodes and only has minimal shortness of breath that occurs with moderate or excessive exertion.  Unfortunately has fairly poor insight and is not a very great historian.  He is here with his family today and they do not report any family history of infiltrative heart disease, autoimmune disease, sarcoid or celiac that/need for pacemakers in their family.  They do have some history of coronary artery disease that appears to be unrelated.   Past Medical History:  Diagnosis Date   Alcohol abuse    Arthritis    Atrial flutter (HCC)    Cardiac sarcoidosis    Chronic pain    Diabetes mellitus without complication (HCC)    Hyperlipidemia    Hypertension    Obesity    Peripheral polyneuropathy    Tobacco abuse    Venous insufficiency     Current Outpatient Medications  Medication Sig Dispense Refill   atorvastatin (LIPITOR) 40 MG tablet Take 1 tablet (40 mg total) by mouth daily. 90 tablet 3   carvedilol (COREG) 3.125 MG tablet TAKE 1 TABLET BY MOUTH 2 TIMES DAILY WITH A MEAL. 60 tablet 3   cetirizine (ZYRTEC) 10 MG tablet Take 1 tablet (10 mg total) by mouth daily. 30 tablet 11   DULoxetine (CYMBALTA) 30 MG capsule Take 1 capsule (30 mg total) by mouth 2 (two) times daily. 180 capsule 1   hydrALAZINE (APRESOLINE) 100 MG tablet Take 1 tablet (100 mg total) by mouth 2 (two) times daily.  180 tablet 3   lidocaine (LIDODERM) 5 % Place 1 patch onto the skin daily. Remove & Discard patch within 12 hours or as directed by MD 30 patch 2   metFORMIN (GLUCOPHAGE) 500 MG tablet TAKE 1 TABLET BY MOUTH DAILY 60 tablet 0   Multiple Vitamins-Minerals (MULTIVITAMIN WITH MINERALS) tablet Take 1 tablet by mouth daily.     oxyCODONE-acetaminophen (PERCOCET) 10-325 MG tablet Take one tablet every 4 to 6 hours as needed for pain 150 tablet 0    pregabalin (LYRICA) 300 MG capsule Take 1 capsule (300 mg total) by mouth 3 (three) times daily. 90 capsule 2   rivaroxaban (XARELTO) 20 MG TABS tablet Take 1 tablet (20 mg total) by mouth daily with supper. 90 tablet 3   Semaglutide, 1 MG/DOSE, 4 MG/3ML SOPN Inject 1 mg as directed once a week. 3 mL 3   sildenafil (VIAGRA) 100 MG tablet Take 0.5-1 tablets (50-100 mg total) by mouth daily as needed for erectile dysfunction. 10 tablet 11   spironolactone (ALDACTONE) 25 MG tablet Take 0.5 tablets (12.5 mg total) by mouth daily.     testosterone cypionate (DEPOTESTOSTERONE CYPIONATE) 200 MG/ML injection Inject 1 mL (200 mg total) into the muscle every 14 (fourteen) days. 10 mL 1   No current facility-administered medications for this encounter.    No Known Allergies    Social History   Socioeconomic History   Marital status: Married    Spouse name: Not on file   Number of children: Not on file   Years of education: Not on file   Highest education level: Not on file  Occupational History   Not on file  Tobacco Use   Smoking status: Every Day    Types: Cigarettes    Passive exposure: Current   Smokeless tobacco: Never   Tobacco comments:    1ppd  Vaping Use   Vaping status: Never Used  Substance and Sexual Activity   Alcohol use: Yes    Comment: daily   Drug use: Never   Sexual activity: Not on file  Other Topics Concern   Not on file  Social History Narrative   Not on file   Social Drivers of Health   Financial Resource Strain: Not on file  Food Insecurity: No Food Insecurity (05/25/2022)   Hunger Vital Sign    Worried About Running Out of Food in the Last Year: Never true    Ran Out of Food in the Last Year: Never true  Transportation Needs: No Transportation Needs (05/25/2022)   PRAPARE - Administrator, Civil Service (Medical): No    Lack of Transportation (Non-Medical): No  Physical Activity: Not on file  Stress: Not on file  Social Connections: Not  on file  Intimate Partner Violence: Not At Risk (05/25/2022)   Humiliation, Afraid, Rape, and Kick questionnaire    Fear of Current or Ex-Partner: No    Emotionally Abused: No    Physically Abused: No    Sexually Abused: No      Family History  Problem Relation Age of Onset   Heart attack Father     PHYSICAL EXAM: Vitals:   05/28/23 1057  BP: (!) 210/102  Pulse: 84  SpO2: 97%   GENERAL: Disheveled HEENT: Negative for arcus senilis or xanthelasma. There is no scleral icterus.  The mucous membranes are pink and moist.   NECK: Supple, No masses. Normal carotid upstrokes without bruits. No masses or thyromegaly.    CHEST: There are no  chest wall deformities. There is no chest wall tenderness. Respirations are unlabored.  Lungs-mildly decreased at bases with mild wheezing CARDIAC:  JVP: Unable to visualize due to body habitus         Normal S1, S2  Normal rate with regular rhythm. No murmurs, rubs or gallops.  Pulses are 2+ and symmetrical in upper and lower extremities.  +1 edema.  ABDOMEN: Soft, non-tender, non-distended. There are no masses or hepatomegaly. There are normal bowel sounds.  EXTREMITIES: Warm and well perfused with no cyanosis, clubbing.  LYMPHATIC: No axillary or supraclavicular lymphadenopathy.  NEUROLOGIC: Patient is oriented x3 with no focal or lateralizing neurologic deficits.  PSYCH: Patients affect is appropriate, there is no evidence of anxiety or depression.  SKIN: Warm and dry; no lesions or wounds.   DATA REVIEW  ECG: 05/28/23: Atrial flutter with intermittent pacing as per my personal interpretation  ECHO: 02/12/23: LVEF 60 to 65% with normal RV function.  Severe LVH as per my personal interpretation  CMR (04/12/23): 1. Mild decrease in LVEF, LVEF 46%, in the setting of free breathing artifact.  2. There is severe hypertrophy of the LV septum. Though this meets criteria for hypertrophic cardiomyopathy, this is multi-focal late gadolinium  enhancement with increase in ECV signal; this is not present in the areas of hypertrophy. Consider PET imaging for inflammatory cardiomyopathy evaluation.  Cardiac PET (05/19/23):   FDG uptake findings are consistent with active myocardial inflammation/sarcoidosis.   FDG uptake was observed. FDG uptake was focal. FDG uptake was present in the apical to basal lateral, apical anterior and apex segment(s). LV perfusion is abnormal. Defect 1: There is a small defect with mild reduction in uptake present in the apical inferior location(s).   Left ventricular function is abnormal. Global function is moderately reduced. EF: 36%.   Coronary calcium was present on the attenuation correction CT images. Moderate coronary calcifications were present. Coronary calcifications were present in the left anterior descending artery, left circumflex artery and right coronary artery distribution(s).   Electronically signed by Epifanio Lesches, MD  ASSESSMENT & PLAN:  Heart failure with mildly reduced EF, Evaluation for infiltrative cardiomyopathy Etiology of HF: Nonischemic cardiomyopathy with significant LVH.  Although he does not have the typical characteristics of cardiac sarcoid he does have patchy LGE on cardiac MRI.  Interestingly he has severe LVH that would be more consistent with hypertrophic cardiomyopathy.  Cannot rule out element a cardiomyopathy.  Can see positive PET scan results with these types of inflammatory infiltrative cardiomyopathies.  His prior chest x-rays do have hilar fullness.  At this time would hold off on immunotherapy, obtain high-res CT and endobronchial biopsy for definitive diagnosis of cardiac sarcoid.  Will obtain genetic testing as soon as it is available. NYHA class / AHA Stage: Difficult to assess, he is a very poor historian.  Likely NYHA IIb-III Volume status & Diuretics: Euvolemic on exam, continue torsemide 20 mg daily Vasodilators:start losartan 50mg  daily, decrease  hydralazine to 50mg . Will plan to wean off hydralazine.  Beta-Blocker: Coreg 3.125 mg twice daily MRA: Spironolactone 12.5 mg daily Cardiometabolic: Jardiance 10 mg daily Devices therapies & Valvulopathies: Dual-chamber permanent pacemaker placed by Dr. Ladona Ridgel for advanced AV block Advanced therapies: Not indicated  2. Persistent atrial fibrillation/flutter -Noted to be in persistent atrial fibrillation since March 2024 -Continue Xarelto 20 mg daily -Followed by Dr. Ladona Ridgel -Continue Coreg 3.125 mg twice daily -Will discuss cardioversion in the future.   3. Coronary artery disease -Nonobstructive mild CAD per left heart  cath in 2019. -No angina  4. Hyperlipidemia -Continue Lipitor 40 mg daily  5. History of PE/DVT and antiphospholipid antibody syndrome -Will require lifelong anticoagulation with Xarelto 20 mg daily.  Abijah Roussel Advanced Heart Failure Mechanical Circulatory Support

## 2023-05-31 ENCOUNTER — Ambulatory Visit
Admission: RE | Admit: 2023-05-31 | Discharge: 2023-05-31 | Disposition: A | Discharge status: Home / Self Care | Payer: BC Managed Care – PPO | Source: Ambulatory Visit | Attending: Physician Assistant | Admitting: Physician Assistant

## 2023-05-31 DIAGNOSIS — R599 Enlarged lymph nodes, unspecified: Secondary | ICD-10-CM | POA: Diagnosis not present

## 2023-05-31 DIAGNOSIS — I700 Atherosclerosis of aorta: Secondary | ICD-10-CM | POA: Diagnosis not present

## 2023-05-31 DIAGNOSIS — D8685 Sarcoid myocarditis: Secondary | ICD-10-CM

## 2023-06-01 ENCOUNTER — Ambulatory Visit: Payer: BC Managed Care – PPO | Admitting: Physical Therapy

## 2023-06-04 ENCOUNTER — Ambulatory Visit: Payer: BC Managed Care – PPO | Admitting: Physical Therapy

## 2023-06-04 ENCOUNTER — Ambulatory Visit: Payer: Self-pay | Admitting: Family Medicine

## 2023-06-04 ENCOUNTER — Other Ambulatory Visit: Payer: Self-pay | Admitting: Family Medicine

## 2023-06-04 NOTE — Telephone Encounter (Unsigned)
Copied from CRM [id number]. Topic: Clinical - Medication Refill >> Jun 04, 2023  1:43 PM Carlatta H wrote: Most Recent Primary Care Visit:  Provider: [facility]  Department: [facility]  Visit Type: LAB  Date: 05/10/2023  Medication: oxyCODONE-acetaminophen (PERCOCET) 10-325 MG tablet  Has the patient contacted their pharmacy? No (Agent: If no, request that the patient contact the pharmacy for the refill. If patient does not wish to contact the pharmacy document the reason why and proceed with request.) (Agent: If yes, when and what did the pharmacy advise?)  Is this the correct pharmacy for this prescription? Yes If no, delete pharmacy and type the correct one.  This is the patient's preferred pharmacy:    [id number] - [place], [place] - [address] [place] [address] Phone: [phone] Fax: [phone]   Has the prescription been filled recently? No  Is the patient out of the medication? Yes  Has the patient been seen for an appointment in the last year OR does the patient have an upcoming appointment? No  Can we respond through MyChart? Yes  Agent: Please be advised that Rx refills may take up to 3 business days. We ask that you follow-up with your pharmacy.

## 2023-06-04 NOTE — Telephone Encounter (Signed)
  Chief Complaint: need urgent refill of oxycodone  Symptoms: Pain in neck and back Disposition: [] ED /[] Urgent Care (no appt availability in office) / [] Appointment(In office/virtual)/ []  [facility]/ [] Home Care/ [] Refused Recommended Disposition /[] [facility]/ [x]  Follow-up with PCP Additional Notes: Patient called in stating he needs someone to call in his Oxycodone refill, states pharmacy requires this to be called in every month. Patient states "I know I waited until after office closed, I am sorry for that but I am really not doing good. I have taken basically a whole bottle of goody powders because I am in so much pain. Can someone please call this in for me tonight?"   Copied from CRM [id number]. Topic: Clinical - Prescription Issue >> Jun 04, 2023  4:33 PM Shelah Lewandowsky wrote: Reason for CRM: oxyCODONE-acetaminophen (PERCOCET) 10-325 MG tablet has been out for a couple days and sent request for refill today Reason for Disposition  [1] Prescription prescribed recently is not at pharmacy AND [2] triager has access to patient's EMR AND [3] prescription is recorded in the EMR  Answer Assessment - Initial Assessment Questions 1. DRUG NAME: "What medicine do you need to have refilled?"     Oxycodone 3. EXPIRATION DATE: "What is the expiration date?" (Note: The label states when the prescription will expire, and thus can no longer be refilled.)     11/01/23 4. PRESCRIBING HCP: "Who prescribed it?" Reason: If prescribed by specialist, call should be referred to that group.     Dr. Constance Goltz 5. SYMPTOMS: "Do you have any symptoms?"     Severe pain located in neck and back  Protocols used: Medication Refill and Renewal Call-A-AH

## 2023-06-07 ENCOUNTER — Other Ambulatory Visit: Payer: Self-pay | Admitting: Family Medicine

## 2023-06-07 MED ORDER — OXYCODONE-ACETAMINOPHEN 10-325 MG PO TABS: ORAL_TABLET | ORAL | 0 refills | Status: DC

## 2023-06-07 NOTE — Telephone Encounter (Signed)
Please let the patient know we are sorry he went the weekend without his medication.  It is sent in now and there are 2 additional refills ordered so he should be good for three months.

## 2023-06-07 NOTE — Telephone Encounter (Signed)
Called pt he is advised of his Rx that has been sent

## 2023-06-08 ENCOUNTER — Telehealth: Payer: Self-pay

## 2023-06-08 ENCOUNTER — Ambulatory Visit: Payer: BC Managed Care – PPO | Admitting: Physical Therapy

## 2023-06-08 NOTE — Telephone Encounter (Signed)
-----   Message from Laurann Montana sent at 06/08/2023 10:14 AM EST ----- Hi triage - please let pt know CT scan showed evidence of what looks like sarcoidosis of the lungs, which also correlates to the recent cardiac issues that were identified. There is incidental pickup of some plaque buildup - has known history of mild CAD and aortic "sclerosis" without narrowing so this is not surprising. There is also evidence of fatty liver disease. I would suggest arranging general cardiology follow-up with Dr. Servando Salina (has not seen since original hospitalization back in 2023) with consideration of lipid follow-up at that time. He has close follow-up with the Heart Failure team which he should keep. Notify for any new concerns. He should also discuss monitoring of fatty liver with his PCP or GI team if he sees one.  Given that AHF team is now helping with his suspected cardiac sarcoidosis, I will route to Dr. Gasper Lloyd to inquire if he wants anything else done for the question of pulm sarcoid seen on this study. Dr .Gasper Lloyd - when I originally referred to AHF clinic, Dr. Shirlee Latch had advised to go ahead and get this CT for evaluation. Appreciate your input. Thank you!

## 2023-06-08 NOTE — Telephone Encounter (Signed)
Left voicemail to return call to office.

## 2023-06-10 ENCOUNTER — Ambulatory Visit: Payer: BC Managed Care – PPO | Admitting: Physical Therapy

## 2023-06-10 ENCOUNTER — Telehealth (HOSPITAL_COMMUNITY): Payer: Self-pay | Admitting: Cardiology

## 2023-06-10 NOTE — Telephone Encounter (Signed)
Patient called to report BLE, requests an appt  Reports  mild SOB, no home weight Reports edema started in feet a week ago and have since moved into knees Reports compliance with meds   Appt available 12/27 @ 10

## 2023-06-11 ENCOUNTER — Encounter (HOSPITAL_COMMUNITY): Payer: Self-pay

## 2023-06-11 ENCOUNTER — Ambulatory Visit (HOSPITAL_COMMUNITY)
Admission: RE | Admit: 2023-06-11 | Discharge: 2023-06-11 | Disposition: A | Discharge status: Home / Self Care | Payer: BC Managed Care – PPO | Source: Ambulatory Visit | Attending: Family Medicine | Admitting: Family Medicine

## 2023-06-11 ENCOUNTER — Telehealth: Payer: Self-pay | Admitting: Physician Assistant

## 2023-06-11 VITALS — BP 154/76 | HR 60 | Wt 315.8 lb

## 2023-06-11 DIAGNOSIS — I4892 Unspecified atrial flutter: Secondary | ICD-10-CM | POA: Diagnosis not present

## 2023-06-11 DIAGNOSIS — D6861 Antiphospholipid syndrome: Secondary | ICD-10-CM | POA: Insufficient documentation

## 2023-06-11 DIAGNOSIS — Z720 Tobacco use: Secondary | ICD-10-CM

## 2023-06-11 DIAGNOSIS — I4819 Other persistent atrial fibrillation: Secondary | ICD-10-CM | POA: Diagnosis not present

## 2023-06-11 DIAGNOSIS — Z86711 Personal history of pulmonary embolism: Secondary | ICD-10-CM | POA: Diagnosis not present

## 2023-06-11 DIAGNOSIS — Z86718 Personal history of other venous thrombosis and embolism: Secondary | ICD-10-CM | POA: Diagnosis not present

## 2023-06-11 DIAGNOSIS — Z950 Presence of cardiac pacemaker: Secondary | ICD-10-CM | POA: Diagnosis not present

## 2023-06-11 DIAGNOSIS — I442 Atrioventricular block, complete: Secondary | ICD-10-CM | POA: Insufficient documentation

## 2023-06-11 DIAGNOSIS — F17210 Nicotine dependence, cigarettes, uncomplicated: Secondary | ICD-10-CM | POA: Insufficient documentation

## 2023-06-11 DIAGNOSIS — D8685 Sarcoid myocarditis: Secondary | ICD-10-CM | POA: Insufficient documentation

## 2023-06-11 DIAGNOSIS — I110 Hypertensive heart disease with heart failure: Secondary | ICD-10-CM | POA: Diagnosis not present

## 2023-06-11 DIAGNOSIS — Z7901 Long term (current) use of anticoagulants: Secondary | ICD-10-CM | POA: Diagnosis not present

## 2023-06-11 DIAGNOSIS — I2510 Atherosclerotic heart disease of native coronary artery without angina pectoris: Secondary | ICD-10-CM | POA: Diagnosis not present

## 2023-06-11 DIAGNOSIS — D869 Sarcoidosis, unspecified: Secondary | ICD-10-CM

## 2023-06-11 DIAGNOSIS — I10 Essential (primary) hypertension: Secondary | ICD-10-CM | POA: Diagnosis not present

## 2023-06-11 DIAGNOSIS — I5022 Chronic systolic (congestive) heart failure: Secondary | ICD-10-CM | POA: Insufficient documentation

## 2023-06-11 DIAGNOSIS — E119 Type 2 diabetes mellitus without complications: Secondary | ICD-10-CM | POA: Diagnosis not present

## 2023-06-11 DIAGNOSIS — I4811 Longstanding persistent atrial fibrillation: Secondary | ICD-10-CM | POA: Diagnosis not present

## 2023-06-11 LAB — BASIC METABOLIC PANEL
Anion gap: 8 (ref 5–15)
BUN: 10 mg/dL (ref 6–20)
CO2: 32 mmol/L (ref 22–32)
Calcium: 9.4 mg/dL (ref 8.9–10.3)
Chloride: 100 mmol/L (ref 98–111)
Creatinine, Ser: 0.92 mg/dL (ref 0.61–1.24)
GFR, Estimated: >60 mL/min (ref >60)
Glucose, Bld: 143 mg/dL — ABNORMAL HIGH (ref 70–99)
Potassium: 3.9 mmol/L (ref 3.5–5.1)
Sodium: 140 mmol/L (ref 135–145)

## 2023-06-11 LAB — BRAIN NATRIURETIC PEPTIDE: B Natriuretic Peptide: 341 pg/mL — ABNORMAL HIGH (ref 0.0–100.0)

## 2023-06-11 MED ORDER — POTASSIUM CHLORIDE CRYS ER 20 MEQ PO TBCR: 20.0000 meq | EXTENDED_RELEASE_TABLET | Freq: Two times a day (BID) | ORAL | 6 refills | Status: DC

## 2023-06-11 MED ORDER — TORSEMIDE 20 MG PO TABS: 20.0000 mg | ORAL_TABLET | Freq: Every day | ORAL | 6 refills | Status: DC

## 2023-06-11 MED ORDER — ALBUTEROL SULFATE HFA 108 (90 BASE) MCG/ACT IN AERS: 2.0000 | INHALATION_SPRAY | Freq: Four times a day (QID) | RESPIRATORY_TRACT | 0 refills | Status: AC | PRN

## 2023-06-11 MED ORDER — EMPAGLIFLOZIN 10 MG PO TABS: 10.0000 mg | ORAL_TABLET | Freq: Every day | ORAL | 6 refills | Status: DC

## 2023-06-11 NOTE — Progress Notes (Addendum)
ADVANCED HEART FAILURE CLINIC NOTE   Primary Care: Sandre Kitty, MD Primary Cardiologist: Dr. Servando Salina HF Cardiologist: Dr. Gasper Lloyd  CC: HF follow up  HPI: Christopher Green is a 52 y.o. male with T2DM, HTN, DVT/PE, HTN, CHB s/p PPM, atrial fibrillation / flutter, lupus anticoagulant with elevated factor 8 on lifelong anticoagulation for antiphospholipid syndrome.  Initial visit with Dr. Gasper Lloyd 05/28/23 to establish care, arranged for HCM genetic testing and GDMT titrated. Hi Res Chest CT showed no evidence of multiple prominent borderline enlarged mediastinal and bilateral hilar lymph nodes.   Today he returns for an acute visit with complaints of increased LE swelling and SOB x 1 week. He is SOB walking short distances on flat ground. Hands also swelling. + cough, white sputum. Denies palpitations, CP, dizziness, or PND/Orthopnea. Appetite fair, has early satiety. No fever or chills. He does not weigh at home. Taking all medications. Smokes 1 ppd.  Activity level/exercise tolerance:  NYHA IIIb Orthopnea:  No Paroxysmal noctural dyspnea:  No Chest pain/pressure:  No Orthostatic lightheadedness:  No Palpitations:  No Lower extremity edema:  Yes, 2-3+ Presyncope/syncope:  No Cough:  Yes, productive  Past Medical History:  Diagnosis Date   Alcohol abuse    Arthritis    Atrial flutter (HCC)    Cardiac sarcoidosis    Chronic pain    Diabetes mellitus without complication (HCC)    Hyperlipidemia    Hypertension    Obesity    Peripheral polyneuropathy    Tobacco abuse    Venous insufficiency    Current Outpatient Medications  Medication Sig Dispense Refill   atorvastatin (LIPITOR) 40 MG tablet Take 1 tablet (40 mg total) by mouth daily. 90 tablet 3   carvedilol (COREG) 3.125 MG tablet TAKE 1 TABLET BY MOUTH 2 TIMES DAILY WITH A MEAL. 60 tablet 3   cetirizine (ZYRTEC) 10 MG tablet Take 1 tablet (10 mg total) by mouth daily. 30 tablet 11   DULoxetine (CYMBALTA) 30 MG  capsule Take 1 capsule (30 mg total) by mouth 2 (two) times daily. (Patient taking differently: Take 30 mg by mouth 2 (two) times daily. As needed) 180 capsule 1   hydrALAZINE (APRESOLINE) 100 MG tablet Take 0.5 tablets (50 mg total) by mouth 2 (two) times daily. (Patient taking differently: Take 100 mg by mouth 2 (two) times daily.) 90 tablet 3   losartan (COZAAR) 50 MG tablet Take 1 tablet (50 mg total) by mouth daily. 90 tablet 3   metFORMIN (GLUCOPHAGE) 500 MG tablet TAKE 1 TABLET BY MOUTH DAILY 60 tablet 0   Multiple Vitamins-Minerals (MULTIVITAMIN WITH MINERALS) tablet Take 1 tablet by mouth daily. As needed     oxyCODONE-acetaminophen (PERCOCET) 10-325 MG tablet Take one tablet every 4 to 6 hours as needed for pain 150 tablet 0   pregabalin (LYRICA) 300 MG capsule Take 1 capsule (300 mg total) by mouth 3 (three) times daily. (Patient taking differently: Take 300 mg by mouth 3 (three) times daily. As needed) 90 capsule 2   rivaroxaban (XARELTO) 20 MG TABS tablet Take 1 tablet (20 mg total) by mouth daily with supper. 90 tablet 3   Semaglutide, 1 MG/DOSE, 4 MG/3ML SOPN Inject 1 mg as directed once a week. 3 mL 3   sildenafil (VIAGRA) 100 MG tablet Take 0.5-1 tablets (50-100 mg total) by mouth daily as needed for erectile dysfunction. 10 tablet 11   spironolactone (ALDACTONE) 25 MG tablet Take 0.5 tablets (12.5 mg total) by mouth daily.  testosterone cypionate (DEPOTESTOSTERONE CYPIONATE) 200 MG/ML injection Inject 1 mL (200 mg total) into the muscle every 14 (fourteen) days. 10 mL 1   No current facility-administered medications for this encounter.   No Known Allergies  Social History   Socioeconomic History   Marital status: Married    Spouse name: Not on file   Number of children: Not on file   Years of education: Not on file   Highest education level: Not on file  Occupational History   Not on file  Tobacco Use   Smoking status: Every Day    Types: Cigarettes    Passive  exposure: Current   Smokeless tobacco: Never   Tobacco comments:    1ppd  Vaping Use   Vaping status: Never Used  Substance and Sexual Activity   Alcohol use: Yes    Comment: daily   Drug use: Never   Sexual activity: Not on file  Other Topics Concern   Not on file  Social History Narrative   Not on file   Social Drivers of Health   Financial Resource Strain: Not on file  Food Insecurity: No Food Insecurity (05/25/2022)   Hunger Vital Sign    Worried About Running Out of Food in the Last Year: Never true    Ran Out of Food in the Last Year: Never true  Transportation Needs: No Transportation Needs (05/25/2022)   PRAPARE - Administrator, Civil Service (Medical): No    Lack of Transportation (Non-Medical): No  Physical Activity: Not on file  Stress: Not on file  Social Connections: Not on file  Intimate Partner Violence: Not At Risk (05/25/2022)   Humiliation, Afraid, Rape, and Kick questionnaire    Fear of Current or Ex-Partner: No    Emotionally Abused: No    Physically Abused: No    Sexually Abused: No   Family History  Problem Relation Age of Onset   Heart attack Father    Wt Readings from Last 3 Encounters:  06/11/23 (!) 143.2 kg (315 lb 12.8 oz)  05/28/23 (!) 139.1 kg (306 lb 9.6 oz)  05/24/23 129.3 kg (285 lb)   BP (!) 154/76   Pulse 60   Wt (!) 143.2 kg (315 lb 12.8 oz)   SpO2 96%   BMI 41.66 kg/m   PHYSICAL EXAM: General:  Walked into clinic, mild conversation dyspnea, chronically-ill appearing. HEENT: Normal Neck: Supple. thick neck, JVP >10 Carotids 2+ bilat; no bruits. No lymphadenopathy or thryomegaly appreciated. Cor: PMI nondisplaced. Irregular rate & rhythm. No rubs, gallops or murmurs. Lungs: + rhonchus throughout Abdomen: Soft, obese, nontender, +distended.  No bruits or masses. Good bowel sounds. Extremities: No cyanosis, clubbing, rash, 2-3+ BLE edema to kness Neuro: Alert & oriented x 3, cranial nerves grossly intact. Moves  all 4 extremities w/o difficulty. Affect pleasant.  [employer] PPM interrogation (personally reviewed): 72% VP, < 1 1 hr/day activity,  ReDs: 43%  DATA REVIEW  ECG: 05/28/23: atrial fibrillation, personally reviewed    ECHO: 02/12/23: EF 60-65%, severe LVH, normal RV   CATH: n/a  CMR (04/12/23): 1. Mild decrease in LVEF, LVEF 46%, in the setting of free breathing artifact.  2. There is severe hypertrophy of the LV septum. Though this meets criteria for hypertrophic cardiomyopathy, this is multi-focal late gadolinium enhancement with increase in ECV signal; this is not present in the areas of hypertrophy. Consider PET imaging for inflammatory cardiomyopathy evaluation.  Cardiac PET (05/19/23):   FDG uptake findings are consistent with  active myocardial inflammation/sarcoidosis.   FDG uptake was observed. FDG uptake was focal. FDG uptake was present in the apical to basal lateral, apical anterior and apex segment(s). LV perfusion is abnormal. Defect 1: There is a small defect with mild reduction in uptake present in the apical inferior location(s).   Left ventricular function is abnormal. Global function is moderately reduced. EF: 36%.   Coronary calcium was present on the attenuation correction CT images. Moderate coronary calcifications were present. Coronary calcifications were present in the left anterior descending artery, left circumflex artery and right coronary artery distribution(s).  Hi Res Chest CT (05/31/23):  1. No evidence of interstitial lung disease. 2. Multiple prominent borderline enlarged mediastinal and bilateral hilar lymph nodes compatible with reported clinical history of sarcoidosis. 3. Aortic atherosclerosis, in addition to three-vessel coronary artery disease. Please note that although the presence of coronary artery calcium documents the presence of coronary artery disease, the severity of this disease and any potential stenosis cannot be assessed on this  non-gated CT examination. Assessment for potential risk factor modification, dietary therapy or pharmacologic therapy may be warranted, if clinically indicated. 4. There are calcifications of the aortic valve. Echocardiographic correlation for evaluation of potential valvular dysfunction may be warranted if clinically indicated. 5. Hepatic steatosis.  ASSESSMENT & PLAN: Heart failure with reduced EF: Echo (8/24) showed EF 60-65%, with severe concentric LVH. CMRI (12/24) with EF LVEF 46%, RVEF 55%, severe hypertrophy of LV septum, this is multi-focal LGE with increase in ECV signal. Cardiac PET (12/24) showed FDG uptake findings consistent with active myocardial inflammation/sarcoidosis. LVEF 36%.  Etiology of HF: Nonischemic cardiomyopathy with significant LVH. Although he does not have the typical characteristics of cardiac sarcoid he does have patchy LGE on cardiac MRI. Interestingly he has severe LVH that would be more consistent with hypertrophic cardiomyopathy. Cannot rule out element a cardiomyopathy. Can see positive PET scan results with these types of inflammatory infiltrative cardiomyopathies. His prior chest x-rays do have hilar fullness. At this time would hold off on immunotherapy, high-res CT results (see above) and obtain endobronchial biopsy for definitive diagnosis of cardiac sarcoid. Will obtain genetic testing as soon as it is available.   NYHA class / AHA Stage: NYHA IIIb Volume status & Diuretics: Volume up today, ReDs 43%. Weight up > 9 lbs - Start torsemide 40 mg daily x 3 days + 40 KCL daily x 3 days, then decrease to torsemide 20 mg daily + 20 KCL daily. Vasodilators: Continue losartan 50 mg daily (eventual switch to Entresto), continue hydralazine 100 mg bid. Will plan to wean off hydralazine.  Beta-Blocker: Continue Coreg 3.125 mg bid. MRA: Continue spironolactone 12.5 mg daily. Cardiometabolic: Start Jardiance 10 mg daily. Discussed potential side effects Devices  therapies & Valvulopathies: Has PPM Advanced therapies: not currently indicated. - Genetic testing has been sent - Labs today, repeat BMET in 1 week.  2. Sarcoid - Cardiac PET (12/24) consistent with active myocardial inflammation/sarcoidosis (see discussion above). - Hi Res CT chest (12/24): multiple prominent borderline enlarged mediastinal and bilateral hilar lymph nodes. - Genetic testing as above. Check ACE level. 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 Judeth Horn for endobronchial bx for definitive diagnosis of cardiac sarcoid  3. CAD - Atherosclerosis on HRCT - No chest pain - On semaglutide - On statin - check lipids next lab draw  4. Atrial fibrillation, longstanding persistent - CHA2DS2Vasc is 4 - Continue Xarelto. No bleeding issues.  5. Tobacco abuse - Smokes 1 ppd > 40 years -  Discussed cessation. - Given Rx for PRN albuterol inhaler today  Follow up in 3 weeks with APP for fluid check, keep follow up with Dr. Gasper Lloyd in 2 months as scheduled.  Prince Rome, FNP-BC 06/11/23

## 2023-06-11 NOTE — Telephone Encounter (Addendum)
Patient seen in HF clinic today, received update that patient wishing to switch cardiology providers. Only saw Dr. Servando Salina once in consult in 2023, requesting switch to Dr. Anne Fu.  (Also being actively followed by CHF team and EP.) Per office protocol will route to both Dr. Servando Salina and Dr. Anne Fu to approve switch. Once we hear back will need to route request to scheduling to call pt with appt.

## 2023-06-11 NOTE — Patient Instructions (Addendum)
Thank you for coming in today  If you had labs drawn today, any labs that are abnormal the clinic will call you No news is good news  Medications: Start jardiance 10 mg daily Start Torsemide 40 mg daily and Potassium 40 meq daily for 3 days  06/11/2023 06/12/2023 06/13/2023 Then 06/14/2023 start Torsemide 20 mg and Potassium 20 meq daily Albuterol Inhaler 2 puff every 6 hours as needed  You have been referred to  Pulmonary,  their office will call you for further appointment details   Follow up appointments: Your physician recommends that you return for lab work in:  10 days for BMET  Your physician recommends that you schedule a follow-up appointment in:  3-4 weeks in clinic    Do the following things EVERYDAY: Weigh yourself in the morning before breakfast. Write it down and keep it in a log. Take your medicines as prescribed Eat low salt foods--Limit salt (sodium) to 2000 mg per day.  Stay as active as you can everyday Limit all fluids for the day to less than 2 liters   At the [facility], you and your health needs are our priority. As part of our continuing mission to provide you with exceptional heart care, we have created designated Provider Care Teams. These Care Teams include your primary Cardiologist (physician) and Advanced Practice Providers (APPs- Physician Assistants and Nurse Practitioners) who all work together to provide you with the care you need, when you need it.   You may see any of the following providers on your designated Care Team at your next follow up: Dr Arvilla Meres Dr Marca Ancona Dr. Marcos Eke, NP Robbie Lis, [place] [facility] [place], [place] Brynda Peon, NP Karle Plumber, PharmD   Please be sure to bring in all your medications bottles to every appointment.    Thank you for choosing [facility]  If you have any questions or concerns before your next  appointment please send [place] a message through [place] or call our office at [phone].    TO LEAVE A MESSAGE FOR THE NURSE SELECT OPTION 2, PLEASE LEAVE A MESSAGE INCLUDING: YOUR NAME DATE OF BIRTH CALL BACK NUMBER REASON FOR CALL**this is important as we prioritize the call backs  YOU WILL RECEIVE A CALL BACK THE SAME DAY AS LONG AS YOU CALL BEFORE 4:00 PM

## 2023-06-11 NOTE — Telephone Encounter (Signed)
I discussed case with Dr. Anne Fu. The patient does not have any specific acute general cardiology needs at this time so we will hold off on getting him back into see gen cards given that he is already following closely with the heart failure team as well as electrophysiology. That way we avoid too many cooks in the kitchen. Dr. Anne Fu indicated that his lipids could be managed by PCP going forward as well. I will route this msg to [facility] to have her team reach out to patient to let him know (in case they were waiting on other results to call to him) - feel free to refer back to general cardiology team going forward if new needs arise. I'll also cc PCP as Lorain Childes regarding the lipid management for future visit. Appreciate the coordination of care!

## 2023-06-11 NOTE — Progress Notes (Signed)
ReDS Vest / Clip - 06/11/23 1000       ReDS Vest / Clip   Station Marker D    Ruler Value 35.5    ReDS Value Range High volume overload    ReDS Actual Value 43

## 2023-06-12 LAB — ANGIOTENSIN CONVERTING ENZYME: Angiotensin-Converting Enzyme: 25 U/L (ref 14–82)

## 2023-06-13 NOTE — Telephone Encounter (Signed)
As per previous note, hold off scheduling gen cards.

## 2023-06-14 NOTE — Addendum Note (Signed)
Encounter addended by: Jacklynn Ganong, FNP on: 06/14/2023 8:03 AM  Actions taken: Clinical Note Signed

## 2023-06-15 ENCOUNTER — Ambulatory Visit: Payer: BC Managed Care – PPO | Admitting: Physical Therapy

## 2023-06-16 DIAGNOSIS — Z419 Encounter for procedure for purposes other than remedying health state, unspecified: Secondary | ICD-10-CM | POA: Diagnosis not present

## 2023-06-17 ENCOUNTER — Encounter: Payer: BC Managed Care – PPO | Admitting: Physical Therapy

## 2023-06-17 NOTE — Progress Notes (Deleted)
   Telephone Visit- Progress Note: Referring Physician:  Chandra Toribio POUR, MD [address] [place],  [place] [address]  Primary Physician:  Christopher Toribio POUR, MD  This visit was performed via telephone.  Patient location: home Provider location: office  I spent a total of *** minutes non-face-to-face activities for this visit on the date of this encounter including review of current clinical condition and response to treatment.    Patient has given verbal consent to this telephone visits and we reviewed the limitations of a telephone visit. Patient wishes to proceed.    Chief Complaint:  ***  History of Present Illness: Christopher Green is a 53 y.o. male has a history of ***.     Exam: No exam done as this was a telephone encounter.     Imaging: ***  I have personally reviewed the images and agree with the above interpretation.  Assessment and Plan: Christopher Green is a pleasant 53 y.o. male with ***  Treatment options discussed with patient and following plan made:   - Order for physical therapy for *** spine ***. Patient to call to schedule appointment. *** - Continue current medications including ***. Reviewed dosing and side effects.  - Prescription for ***. Reviewed dosing and side effects. Take with food.  - Prescription for *** to take prn muscle spasms. Reviewed dosing and side effects. Discussed this can cause drowsiness.  - MRI of *** to further evaluate *** radiculopathy. No improvement time or medications (***).  - Referral to [facility] at [facility] to discuss possible *** injections.  - Will schedule phone visit to review MRI results once I get them back.   Christopher Boys PA-C Neurosurgery

## 2023-06-18 ENCOUNTER — Telehealth: Payer: BC Managed Care – PPO | Admitting: Orthopedic Surgery

## 2023-06-22 ENCOUNTER — Ambulatory Visit (HOSPITAL_COMMUNITY)
Admission: RE | Admit: 2023-06-22 | Discharge: 2023-06-22 | Disposition: A | Discharge status: Home / Self Care | Payer: BC Managed Care – PPO | Source: Ambulatory Visit | Attending: Internal Medicine | Admitting: Internal Medicine

## 2023-06-22 DIAGNOSIS — I5022 Chronic systolic (congestive) heart failure: Secondary | ICD-10-CM | POA: Insufficient documentation

## 2023-06-22 LAB — BASIC METABOLIC PANEL
Anion gap: 12 (ref 5–15)
BUN: 12 mg/dL (ref 6–20)
CO2: 29 mmol/L (ref 22–32)
Calcium: 9.5 mg/dL (ref 8.9–10.3)
Chloride: 99 mmol/L (ref 98–111)
Creatinine, Ser: 1.12 mg/dL (ref 0.61–1.24)
GFR, Estimated: >60 mL/min (ref >60)
Glucose, Bld: 144 mg/dL — ABNORMAL HIGH (ref 70–99)
Potassium: 4.3 mmol/L (ref 3.5–5.1)
Sodium: 140 mmol/L (ref 135–145)

## 2023-06-28 ENCOUNTER — Ambulatory Visit
Admission: RE | Admit: 2023-06-28 | Discharge: 2023-06-28 | Disposition: A | Discharge status: Home / Self Care | Payer: BC Managed Care – PPO | Source: Ambulatory Visit | Attending: Student in an Organized Health Care Education/Training Program | Admitting: Student in an Organized Health Care Education/Training Program

## 2023-06-28 ENCOUNTER — Ambulatory Visit
Payer: BC Managed Care – PPO | Attending: Student in an Organized Health Care Education/Training Program | Admitting: Student in an Organized Health Care Education/Training Program

## 2023-06-28 ENCOUNTER — Ambulatory Visit: Payer: Self-pay | Admitting: Family Medicine

## 2023-06-28 ENCOUNTER — Encounter (HOSPITAL_COMMUNITY): Payer: BC Managed Care – PPO

## 2023-06-28 DIAGNOSIS — M4802 Spinal stenosis, cervical region: Secondary | ICD-10-CM | POA: Diagnosis not present

## 2023-06-28 DIAGNOSIS — G894 Chronic pain syndrome: Secondary | ICD-10-CM | POA: Diagnosis not present

## 2023-06-28 DIAGNOSIS — M5412 Radiculopathy, cervical region: Secondary | ICD-10-CM | POA: Insufficient documentation

## 2023-06-28 MED ORDER — DEXAMETHASONE SODIUM PHOSPHATE 10 MG/ML IJ SOLN
10.0000 mg | Freq: Once | INTRAMUSCULAR | Status: AC
  Administered 2023-06-28: 10 mg
  Filled 2023-06-28: qty 1

## 2023-06-28 MED ORDER — LIDOCAINE HCL 2 % IJ SOLN
20.0000 mL | Freq: Once | INTRAMUSCULAR | Status: AC
  Administered 2023-06-28: 400 mg
  Filled 2023-06-28: qty 20

## 2023-06-28 MED ORDER — IOHEXOL 180 MG/ML  SOLN
10.0000 mL | Freq: Once | INTRAMUSCULAR | Status: AC
  Administered 2023-06-28: 10 mL via EPIDURAL
  Filled 2023-06-28: qty 20

## 2023-06-28 MED ORDER — SODIUM CHLORIDE 0.9% FLUSH
1.0000 mL | Freq: Once | INTRAVENOUS | Status: AC
  Administered 2023-06-28: 1 mL

## 2023-06-28 MED ORDER — ROPIVACAINE HCL 2 MG/ML IJ SOLN
1.0000 mL | Freq: Once | INTRAMUSCULAR | Status: AC
  Administered 2023-06-28: 1 mL via EPIDURAL
  Filled 2023-06-28: qty 20

## 2023-06-28 NOTE — Progress Notes (Signed)
 PROVIDER NOTE: Interpretation of information contained herein should be left to medically-trained personnel. Specific patient instructions are provided elsewhere under Patient Instructions section of medical record. This document was created in part using STT-dictation technology, any transcriptional errors that may result from this process are unintentional.  Patient: Christopher Green Type: Established DOB: 01-14-1971 MRN: [id number] PCP: Chandra Toribio POUR, MD  Service: Procedure DOS: 06/28/2023 Setting: Ambulatory Location: Ambulatory outpatient facility Delivery: Face-to-face Provider: Wallie Sherry, MD Specialty: Interventional Pain Management Specialty designation: 09 Location: Outpatient facility Ref. Prov.: Sherry Wallie, MD       Interventional Therapy   Procedure: Cervical Epidural Steroid injection (CESI) (Interlaminar) #2  Laterality: Left  Level: C7-T1 Imaging: Fluoroscopy-assisted DOS: 06/28/2023  Performed by: Wallie Sherry, MD Anesthesia: Local anesthesia (1-2% Lidocaine ) Sedation: Minimal Sedation                         Purpose: Diagnostic/Therapeutic Indications: Cervicalgia, cervical radicular pain, degenerative disc disease, severe enough to impact quality of life or function. 1. Cervical radicular pain   2. Foraminal stenosis of cervical region   3. Chronic pain syndrome    NAS-11 score:   Pre-procedure: 6 /10   Post-procedure: 3 /10      Position  Prep  Materials:  Location setting: Procedure suite Position: Prone, on modified reverse trendelenburg to facilitate breathing, with head in head-cradle. Pillows positioned under chest (below chin-level) with cervical spine flexed. Safety Precautions: Patient was assessed for positional comfort and pressure points before starting the procedure. Prepping solution: DuraPrep (Iodine  Povacrylex [0.7% available iodine ] and Isopropyl Alcohol, 74% w/w) Prep Area: Entire  cervicothoracic region Approach: percutaneous,  paramedial Intended target: Posterior cervical epidural space Materials Procedure:  Tray: Epidural Needle(s): Epidural (Tuohy) Qty: 1 Length: (90mm) 3.5-inch Gauge: 22G   H&P (Pre-op  Assessment):  Christopher Green is a 53 y.o. (year old), male patient, seen today for interventional treatment. 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 Christopher Green has a current medication list which includes the following prescription(s): albuterol , atorvastatin , carvedilol , cetirizine , duloxetine , empagliflozin , hydralazine , losartan , metformin , multivitamin with minerals, oxycodone -acetaminophen , potassium chloride  sa, pregabalin , rivaroxaban , semaglutide  (1 mg/dose), sildenafil , spironolactone , testosterone  cypionate, and torsemide . His primarily concern today is the Neck Pain (Bilateral left is worse)  Initial Vital Signs:  Pulse/HCG Rate:  ECG Heart Rate: 62 Temp: (!) 96.8 F (36 C) Resp: 16 BP: (!) 161/79 SpO2: 98 %  BMI: Estimated body mass index is 37.6 kg/m as calculated from the following:   Height as of this encounter: 6' 1 (1.854 m).   Weight as of this encounter: 285 lb (129.3 kg).  Risk Assessment: Allergies: Reviewed. He has no known allergies.  Allergy Precautions: None required Coagulopathies: Reviewed. None identified.  Blood-thinner therapy: None at this time Active Infection(s): Reviewed. None identified. Christopher Green is afebrile  Site Confirmation: Christopher Green was asked to confirm the procedure and laterality before marking the site Procedure checklist: Completed Consent: Before the procedure and under the influence of no sedative(s), amnesic(s), or anxiolytics, the patient was informed of the treatment options, risks and possible complications. To fulfill our ethical and legal obligations, as recommended by the American Medical Association's Code of Ethics, I have informed the patient  of my clinical impression; the nature and purpose of the treatment or procedure; the risks, benefits, and possible complications of the intervention; the alternatives, including doing nothing; the risk(s) and benefit(s) of  the alternative treatment(s) or procedure(s); and the risk(s) and benefit(s) of doing nothing. The patient was provided information about the general risks and possible complications associated with the procedure. These may include, but are not limited to: failure to achieve desired goals, infection, bleeding, organ or nerve damage, allergic reactions, paralysis, and death. In addition, the patient was informed of those risks and complications associated to Spine-related procedures, such as failure to decrease pain; infection (i.e.: Meningitis, epidural or intraspinal abscess); bleeding (i.e.: epidural hematoma, subarachnoid hemorrhage, or any other type of intraspinal or peri-dural bleeding); organ or nerve damage (i.e.: Any type of peripheral nerve, nerve root, or spinal cord injury) with subsequent damage to sensory, motor, and/or autonomic systems, resulting in permanent pain, numbness, and/or weakness of one or several areas of the body; allergic reactions; (i.e.: anaphylactic reaction); and/or death. Furthermore, the patient was informed of those risks and complications associated with the medications. These include, but are not limited to: allergic reactions (i.e.: anaphylactic or anaphylactoid reaction(s)); adrenal axis suppression; blood sugar elevation that in diabetics may result in ketoacidosis or comma; water retention that in patients with history of congestive heart failure may result in shortness of breath, pulmonary edema, and decompensation with resultant heart failure; weight gain; swelling or edema; medication-induced neural toxicity; particulate matter embolism and blood vessel occlusion with resultant organ, and/or nervous system infarction; and/or aseptic necrosis of one  or more joints. Finally, the patient was informed that Medicine is not an exact science; therefore, there is also the possibility of unforeseen or unpredictable risks and/or possible complications that may result in a catastrophic outcome. The patient indicated having understood very clearly. We have given the patient no guarantees and we have made no promises. Enough time was given to the patient to ask questions, all of which were answered to the patient's satisfaction. Mr. Boudoin has indicated that he wanted to continue with the procedure. Attestation: I, the ordering provider, attest that I have discussed with the patient the benefits, risks, side-effects, alternatives, likelihood of achieving goals, and potential problems during recovery for the procedure that I have provided informed consent. Date  Time: 06/28/2023 11:06 AM   Pre-Procedure Preparation:  Monitoring: As per clinic protocol. Respiration, ETCO2, SpO2, BP, heart rate and rhythm monitor placed and checked for adequate function Safety Precautions: Patient was assessed for positional comfort and pressure points before starting the procedure. Time-out: I initiated and conducted the Time-out before starting the procedure, as per protocol. The patient was asked to participate by confirming the accuracy of the Time Out information. Verification of the correct person, site, and procedure were performed and confirmed by me, the nursing staff, and the patient. Time-out conducted as per Joint Commission's Universal Protocol (UP.01.01.01). Time: 1137 Start Time: 1137 hrs.  Description  Narrative of Procedure:          Rationale (medical necessity): procedure needed and proper for the diagnosis and/or treatment of the patient's medical symptoms and needs. Start Time: 1137 hrs. Safety Precautions: Aspiration looking for blood return was conducted prior to all injections. At no point did we inject any substances, as a needle was being  advanced. No attempts were made at seeking any paresthesias. Safe injection practices and needle disposal techniques used. Medications properly checked for expiration dates. SDV (single dose vial) medications used. Description of procedure: Protocol guidelines were followed. The patient was assisted into a comfortable position. The target area was identified and the area prepped in the usual manner. Skin & deeper tissues infiltrated with  local anesthetic. Appropriate amount of time allowed to pass for local anesthetics to take effect. Using fluoroscopic guidance, the epidural needle was introduced through the skin, ipsilateral to the reported pain, and advanced to the target area. Posterior laminar os was contacted and the needle walked caudad, until the lamina was cleared. The ligamentum flavum was engaged and the epidural space identified using "loss-of-resistance technique" with 2-3 ml of PF-NaCl (0.9% NSS), in a 5cc dedicated LOR syringe. (See Imaging guidance below for use of contrast details.) Once proper needle placement was secured, and negative aspiration confirmed, the solution was injected in intermittent fashion, asking for systemic symptoms every 0.5cc. The needles were then removed and the area cleansed, making sure to leave some of the prepping solution back to take advantage of its long term bactericidal properties.  Vitals:   06/28/23 1113 06/28/23 1131 06/28/23 1136 06/28/23 1141  BP: (!) 161/79 (!) 160/93 (!) 163/84 (!) 133/91  Resp: 16 18 15 15   Temp: (!) 96.8 F (36 C)     SpO2: 98% 97% 98% 97%  Weight: 285 lb (129.3 kg)     Height: 6' 1 (1.854 m)        End Time: 1141 hrs.  Imaging Guidance (Spinal):          Type of Imaging Technique: Fluoroscopy Guidance (Spinal) Indication(s): Fluoroscopy guidance for needle placement to enhance accuracy in procedures requiring precise needle localization for targeted delivery of medication in or near specific anatomical locations not  easily accessible without such real-time imaging assistance. Exposure Time: Please see nurses notes. Contrast: Before injecting any contrast, we confirmed that the patient did not have an allergy to iodine , shellfish, or radiological contrast. Once satisfactory needle placement was completed at the desired level, radiological contrast was injected. Contrast injected under live fluoroscopy. No contrast complications. See chart for type and volume of contrast used. Fluoroscopic Guidance: I was personally present during the use of fluoroscopy. Tunnel Vision Technique used to obtain the best possible view of the target area. Parallax error corrected before commencing the procedure. Direction-depth-direction technique used to introduce the needle under continuous pulsed fluoroscopy. Once target was reached, antero-posterior, oblique, and lateral fluoroscopic projection used confirm needle placement in all planes. Images permanently stored in EMR. Interpretation: I personally interpreted the imaging intraoperatively. Adequate needle placement confirmed in multiple planes. Appropriate spread of contrast into desired area was observed. No evidence of afferent or efferent intravascular uptake. No intrathecal or subarachnoid spread observed. Permanent images saved into the patient's record.  Post-operative Assessment:  Post-procedure Vital Signs:  Pulse/HCG Rate:  64 Temp: (!) 96.8 F (36 C) Resp: 15 BP: (!) 133/91 SpO2: 97 %  EBL: None  Complications: No immediate post-treatment complications observed by team, or reported by patient.  Note: The patient tolerated the entire procedure well. A repeat set of vitals were taken after the procedure and the patient was kept under observation following institutional policy, for this type of procedure. Post-procedural neurological assessment was performed, showing return to baseline, prior to discharge. The patient was provided with post-procedure discharge  instructions, including a section on how to identify potential problems. Should any problems arise concerning this procedure, the patient was given instructions to immediately contact us , at any time, without hesitation. In any case, we plan to contact the patient by telephone for a follow-up status report regarding this interventional procedure.  Comments:  No additional relevant information.  Plan of Care (POC)  Orders:  Orders Placed This Encounter  Procedures  DG PAIN CLINIC C-ARM 1-60 MIN NO REPORT    Intraoperative interpretation by procedural physician at [facility].    Standing Status:   Standing    Number of Occurrences:   1    Reason for exam::   Assistance in needle guidance and placement for procedures requiring needle placement in or near specific anatomical locations not easily accessible without such assistance.    Medications ordered for procedure: Meds ordered this encounter  Medications   iohexol  (OMNIPAQUE ) 180 MG/ML injection 10 mL    Must be Myelogram-compatible. If not available, you may substitute with a water-soluble, non-ionic, hypoallergenic, myelogram-compatible radiological contrast medium.   lidocaine  (XYLOCAINE ) 2 % (with pres) injection 400 mg   ropivacaine  (PF) 2 mg/mL (0.2%) (NAROPIN ) injection 1 mL   sodium chloride  flush (NS) 0.9 % injection 1 mL   dexamethasone  (DECADRON ) injection 10 mg   Medications administered: We administered iohexol , lidocaine , ropivacaine  (PF) 2 mg/mL (0.2%), sodium chloride  flush, and dexamethasone .  See the medical record for exact dosing, route, and time of administration.  Follow-up plan:   Return in about 4 weeks (around 07/26/2023) for F2F PPE.       Left Cervical ESI 06/28/23    Recent Visits Date Type Provider Dept  05/24/23 Office Visit Marcelino Nurse, MD [facility]  Showing recent visits within past 90 days and meeting all other requirements Today's Visits Date Type Provider Dept   06/28/23 Procedure visit Marcelino Nurse, MD [facility]  Showing today's visits and meeting all other requirements Future Appointments Date Type Provider Dept  07/26/23 Appointment Marcelino Nurse, MD [facility]  Showing future appointments within next 90 days and meeting all other requirements  Disposition: Discharge home  Discharge (Date  Time): 06/28/2023; 1155 hrs.   Primary Care Physician: Chandra Toribio POUR, MD Location: [facility] Note by: Nurse Marcelino, MD (TTS technology used. I apologize for any typographical errors that were not detected and corrected.) Date: 06/28/2023; Time: 12:00 PM  Disclaimer:  Medicine is not an visual merchandiser. The only guarantee in medicine is that nothing is guaranteed. It is important to note that the decision to proceed with this intervention was based on the information collected from the patient. The Data and conclusions were drawn from the patient's questionnaire, the interview, and the physical examination. Because the information was provided in large part by the patient, it cannot be guaranteed that it has not been purposely or unconsciously manipulated. Every effort has been made to obtain as much relevant data as possible for this evaluation. It is important to note that the conclusions that lead to this procedure are derived in large part from the available data. Always take into account that the treatment will also be dependent on availability of resources and existing treatment guidelines, considered by other Pain Management Practitioners as being common knowledge and practice, at the time of the intervention. For Medico-Legal purposes, it is also important to point out that variation in procedural techniques and pharmacological choices are the acceptable norm. The indications, contraindications, technique, and results of the above procedure should only be interpreted and judged by a Board-Certified  Interventional Pain Specialist with extensive familiarity and expertise in the same exact procedure and technique.

## 2023-06-28 NOTE — Patient Instructions (Signed)

## 2023-06-28 NOTE — Telephone Encounter (Signed)
 Copied from CRM [id number]. Topic: Clinical - Medication Question >> Jun 25, 2023  1:08 PM Geneva B wrote: Reason for CRM: patient is calling in because he needs to switch his rx rivaroxaban (XARELTO) 20 MG TABS tablet its too expensive

## 2023-06-28 NOTE — Progress Notes (Signed)
 Safety precautions to be maintained throughout the outpatient stay will include: orient to surroundings, keep bed in low position, maintain call bell within reach at all times, provide assistance with transfer out of bed and ambulation.

## 2023-06-28 NOTE — Telephone Encounter (Signed)
 Can we call the patient and ask him what the cost was?  There are patient assistance programs that can reduce the cost.  We can have someone help him with filling these forms out.

## 2023-06-29 ENCOUNTER — Other Ambulatory Visit (HOSPITAL_COMMUNITY): Payer: Self-pay

## 2023-06-29 ENCOUNTER — Other Ambulatory Visit: Payer: Self-pay | Admitting: Family Medicine

## 2023-06-29 ENCOUNTER — Telehealth: Payer: Self-pay | Admitting: *Deleted

## 2023-06-29 DIAGNOSIS — D6862 Lupus anticoagulant syndrome: Secondary | ICD-10-CM

## 2023-06-29 NOTE — Telephone Encounter (Signed)
 Called pt he is advised about the referral he stated that he has already reach out to his cardiologist for his medication

## 2023-06-29 NOTE — Telephone Encounter (Signed)
 No problems post procedure.

## 2023-06-29 NOTE — Telephone Encounter (Addendum)
 Please let the patient know I am sending in a referral and someone should call him to help him with the medication assistance process.  If he needs any of his xarelto  before that time I would recommend he reach out to his cardiologist's office to see if they have samples they are able to give him.  We do not carry any medication samples here.

## 2023-06-30 ENCOUNTER — Telehealth (HOSPITAL_COMMUNITY): Payer: Self-pay | Admitting: Licensed Clinical Social Worker

## 2023-06-30 ENCOUNTER — Encounter: Payer: Self-pay | Admitting: Physician Assistant

## 2023-06-30 ENCOUNTER — Telehealth: Payer: Self-pay

## 2023-06-30 NOTE — Progress Notes (Signed)
 Care Guide Pharmacy Note  06/30/2023 Name: [name] MRN: [id number] DOB: 10-12-1970  Referred By: Laneta Pintos, MD Reason for referral: Care Coordination (Outreach to schedule with Pharm d )   Christopher Green is a 53 y.o. year old male who is a primary care patient of Laneta Pintos, MD.  [name] was referred to the pharmacist for assistance related to: HTN and DMII  Successful contact was made with the patient to discuss pharmacy services.  Patient declines engagement at this time. Contact information was provided to the patient should they wish to reach out for assistance at a later time.  Lenton Rail , RMA     [facility]  [facility], [facility] Guide  Direct Dial: [phone]  Website: [email or web address]

## 2023-06-30 NOTE — Telephone Encounter (Signed)
 H&V Care Navigation CSW Progress Note  Clinical Social Worker received call from pt requesting help with getting letter explaining why he couldn't work for food stamp approval- letter provided.   SDOH Screenings   Food Insecurity: No Food Insecurity (05/25/2022)  Housing: Low Risk  (05/25/2022)  Transportation Needs: No Transportation Needs (05/25/2022)  Utilities: Not At Risk (05/25/2022)  Depression (PHQ2-9): Low Risk  (05/24/2023)  Recent Concern: Depression (PHQ2-9) - High Risk (05/07/2023)  Tobacco Use: High Risk (06/11/2023)    Denton Flakes, LCSW Clinical Social Worker [facility] Desk#: [phone] Cell#: [phone]

## 2023-07-01 ENCOUNTER — Other Ambulatory Visit: Payer: Self-pay

## 2023-07-09 NOTE — Progress Notes (Signed)
ADVANCED HEART FAILURE CLINIC NOTE   Primary Care: Sandre Kitty, MD Primary Cardiologist: previously followed by Dr. Servando Salina HF Cardiologist: Dr. Gasper Lloyd  CC: HF follow up  HPI: Christopher Green is a 53 y.o. male with T2DM, HTN, DVT/PE, HTN, CHB s/p PPM, atrial fibrillation / flutter, lupus anticoagulant with elevated factor 8 on lifelong anticoagulation for antiphospholipid syndrome.  Initial visit with Dr. Gasper Lloyd 05/28/23 to establish care, arranged for HCM genetic testing and GDMT titrated. Hi Res Chest CT showed no evidence of ILD, but showed multiple prominent borderline enlarged mediastinal and bilateral hilar lymph nodes.   Follow up 12/24 with APP, volume overloaded with NYHA IIIb symptoms, REDs 43%. Torsemide and Jardiance started.  Today he returns for HF follow up. Overall feeling better. Breathing has improved, now mildly SOB walking further distances. LEE resolved. Cough has improved. Denies palpitations, CP, dizziness, or PND/Orthopnea. Appetite ok. No fever or chills. Weight at home 293 pounds. Taking all medications, wife arranges pillbox. Smoking 1 ppd. Complaining of worsening daytime fatigue, he snores.  Activity level/exercise tolerance:  NYHA II Orthopnea:  No Paroxysmal noctural dyspnea:  No Chest pain/pressure:  No Orthostatic lightheadedness:  No Palpitations:  No Lower extremity edema:  No Presyncope/syncope:  No Cough:  Yes, improving  Past Medical History:  Diagnosis Date   Alcohol abuse    Arthritis    Atrial flutter (HCC)    Cardiac sarcoidosis    Chronic pain    Diabetes mellitus without complication (HCC)    Hyperlipidemia    Hypertension    Obesity    Peripheral polyneuropathy    Tobacco abuse    Venous insufficiency    Current Outpatient Medications  Medication Sig Dispense Refill   albuterol (VENTOLIN HFA) 108 (90 Base) MCG/ACT inhaler Inhale 2 puffs into the lungs every 6 (six) hours as needed for wheezing or shortness of breath.  8 g 0   atorvastatin (LIPITOR) 40 MG tablet Take 1 tablet (40 mg total) by mouth daily. 90 tablet 3   carvedilol (COREG) 3.125 MG tablet TAKE 1 TABLET BY MOUTH 2 TIMES DAILY WITH A MEAL. 60 tablet 3   cetirizine (ZYRTEC) 10 MG tablet Take 1 tablet (10 mg total) by mouth daily. 30 tablet 11   DULoxetine (CYMBALTA) 30 MG capsule Take 1 capsule (30 mg total) by mouth 2 (two) times daily. (Patient taking differently: Take 30 mg by mouth 2 (two) times daily. As needed) 180 capsule 1   empagliflozin (JARDIANCE) 10 MG TABS tablet Take 1 tablet (10 mg total) by mouth daily before breakfast. 30 tablet 6   hydrALAZINE (APRESOLINE) 100 MG tablet Take 0.5 tablets (50 mg total) by mouth 2 (two) times daily. (Patient taking differently: Take 100 mg by mouth 2 (two) times daily.) 90 tablet 3   losartan (COZAAR) 50 MG tablet Take 1 tablet (50 mg total) by mouth daily. 90 tablet 3   metFORMIN (GLUCOPHAGE) 500 MG tablet TAKE 1 TABLET BY MOUTH DAILY 60 tablet 0   Multiple Vitamins-Minerals (MULTIVITAMIN WITH MINERALS) tablet Take 1 tablet by mouth daily. As needed     oxyCODONE-acetaminophen (PERCOCET) 10-325 MG tablet Take one tablet every 4 to 6 hours as needed for pain 150 tablet 0   potassium chloride SA (KLOR-CON M) 20 MEQ tablet Take 1 tablet (20 mEq total) by mouth 2 (two) times daily. 33 tablet 6   pregabalin (LYRICA) 300 MG capsule Take 1 capsule (300 mg total) by mouth 3 (three) times daily. (Patient taking differently:  Take 300 mg by mouth 3 (three) times daily. As needed) 90 capsule 2   rivaroxaban (XARELTO) 20 MG TABS tablet Take 1 tablet (20 mg total) by mouth daily with supper. 90 tablet 3   Semaglutide, 1 MG/DOSE, 4 MG/3ML SOPN Inject 1 mg as directed once a week. 3 mL 3   sildenafil (VIAGRA) 100 MG tablet Take 0.5-1 tablets (50-100 mg total) by mouth daily as needed for erectile dysfunction. 10 tablet 11   spironolactone (ALDACTONE) 25 MG tablet Take 0.5 tablets (12.5 mg total) by mouth daily.      testosterone cypionate (DEPOTESTOSTERONE CYPIONATE) 200 MG/ML injection Inject 1 mL (200 mg total) into the muscle every 14 (fourteen) days. 10 mL 1   torsemide (DEMADEX) 20 MG tablet Take 1 tablet (20 mg total) by mouth daily. 33 tablet 6   No current facility-administered medications for this encounter.   No Known Allergies  Social History   Socioeconomic History   Marital status: Married    Spouse name: Not on file   Number of children: Not on file   Years of education: Not on file   Highest education level: Not on file  Occupational History   Not on file  Tobacco Use   Smoking status: Every Day    Types: Cigarettes    Passive exposure: Current   Smokeless tobacco: Never   Tobacco comments:    1ppd  Vaping Use   Vaping status: Never Used  Substance and Sexual Activity   Alcohol use: Yes    Comment: daily   Drug use: Never   Sexual activity: Not on file  Other Topics Concern   Not on file  Social History Narrative   Not on file   Social Drivers of Health   Financial Resource Strain: Not on file  Food Insecurity: No Food Insecurity (05/25/2022)   Hunger Vital Sign    Worried About Running Out of Food in the Last Year: Never true    Ran Out of Food in the Last Year: Never true  Transportation Needs: No Transportation Needs (05/25/2022)   PRAPARE - Administrator, Civil Service (Medical): No    Lack of Transportation (Non-Medical): No  Physical Activity: Not on file  Stress: Not on file  Social Connections: Not on file  Intimate Partner Violence: Not At Risk (05/25/2022)   Humiliation, Afraid, Rape, and Kick questionnaire    Fear of Current or Ex-Partner: No    Emotionally Abused: No    Physically Abused: No    Sexually Abused: No   Family History  Problem Relation Age of Onset   Heart attack Father    Wt Readings from Last 3 Encounters:  07/12/23 (!) 137.9 kg (304 lb)  06/28/23 129.3 kg (285 lb)  06/11/23 (!) 143.2 kg (315 lb 12.8 oz)   BP  108/62   Pulse 69   Wt (!) 137.9 kg (304 lb)   SpO2 97%   BMI 40.11 kg/m   PHYSICAL EXAM: General:  NAD. No resp difficulty, walked into clinic HEENT: Normal Neck: Supple. No JVD. Carotids 2+ bilat; no bruits. No lymphadenopathy or thryomegaly appreciated. Cor: PMI nondisplaced. Irregular rate & rhythm. No rubs, gallops or murmurs. Lungs: Clear Abdomen: Soft, obese, nontender, nondistended. No hepatosplenomegaly. No bruits or masses. Good bowel sounds. Extremities: No cyanosis, clubbing, rash, edema Neuro: Alert & oriented x 3, cranial nerves grossly intact. Moves all 4 extremities w/o difficulty. Affect pleasant.  [employer] PPM interrogation (personally reviewed): 71% VP, <  1 1 hr/day activity,  ReDs today: 38%, abnormal (but improved from previous of 43%, < 1 hr/day activity  DATA REVIEW  ECG: 05/28/23: atrial fibrillation  ECHO: 02/12/23: EF 60-65%, severe LVH, normal RV   CATH: n/a  CMR (04/12/23): 1. Mild decrease in LVEF, LVEF 46%, in the setting of free breathing artifact.  2. There is severe hypertrophy of the LV septum. Though this meets criteria for hypertrophic cardiomyopathy, this is multi-focal late gadolinium enhancement with increase in ECV signal; this is not present in the areas of hypertrophy. Consider PET imaging for inflammatory cardiomyopathy evaluation.  Cardiac PET (05/19/23):   FDG uptake findings are consistent with active myocardial inflammation/sarcoidosis.   FDG uptake was observed. FDG uptake was focal. FDG uptake was present in the apical to basal lateral, apical anterior and apex segment(s). LV perfusion is abnormal. Defect 1: There is a small defect with mild reduction in uptake present in the apical inferior location(s).   Left ventricular function is abnormal. Global function is moderately reduced. EF: 36%.   Coronary calcium was present on the attenuation correction CT images. Moderate coronary calcifications were present. Coronary  calcifications were present in the left anterior descending artery, left circumflex artery and right coronary artery distribution(s).  Hi Res Chest CT (05/31/23):  1. No evidence of interstitial lung disease. 2. Multiple prominent borderline enlarged mediastinal and bilateral hilar lymph nodes compatible with reported clinical history of sarcoidosis. 3. Aortic atherosclerosis, in addition to three-vessel coronary artery disease. Please note that although the presence of coronary artery calcium documents the presence of coronary artery disease, the severity of this disease and any potential stenosis cannot be assessed on this non-gated CT examination. Assessment for potential risk factor modification, dietary therapy or pharmacologic therapy may be warranted, if clinically indicated. 4. There are calcifications of the aortic valve. Echocardiographic correlation for evaluation of potential valvular dysfunction may be warranted if clinically indicated. 5. Hepatic steatosis.  ASSESSMENT & PLAN: Heart failure with reduced EF: Echo (8/24) showed EF 60-65%, with severe concentric LVH. CMRI (12/24) with EF LVEF 46%, RVEF 55%, severe hypertrophy of LV septum, this is multi-focal LGE with increase in ECV signal. Cardiac PET (12/24) showed FDG uptake findings consistent with active myocardial inflammation/sarcoidosis. LVEF 36%.  Etiology of HF: Nonischemic cardiomyopathy with significant LVH. Although he does not have the typical characteristics of cardiac sarcoid he does have patchy LGE on cardiac MRI. Interestingly he has severe LVH that would be more consistent with hypertrophic cardiomyopathy. Cannot rule out element a cardiomyopathy. Can see positive PET scan results with these types of inflammatory infiltrative cardiomyopathies. His prior chest x-rays do have hilar fullness. At this time would hold off on immunotherapy, high-res CT results (see above) and obtain endobronchial biopsy for definitive  diagnosis of cardiac sarcoid. Genetic testing has been arranged.   NYHA class / AHA Stage: NYHA II, functional class confounded by body habitus and likely COPD Volume status & Diuretics: Volume much improved, weight down and REDs 38% (improved from previous of 43%) - Continue torsemide 20 mg daily + 20 KCL daily. Vasodilators: Stop losartan, and start Entresto 24/26 mg bid. - Decrease hydralazine to 25 mg bid. Plan to eventually stop.  Beta-Blocker: Continue Coreg 3.125 mg bid. MRA: Continue spironolactone 12.5 mg daily. Cardiometabolic: Continue Jardiance 10 mg daily. No GU side effects Devices therapies & Valvulopathies: Has PPM Advanced therapies: not currently indicated. - Genetic testing has been sent - Labs today  2. Sarcoid - Cardiac PET (12/24) consistent with active  myocardial inflammation/sarcoidosis (see discussion above). - Hi Res CT chest (12/24): multiple prominent borderline enlarged mediastinal and bilateral hilar lymph nodes. - Genetic testing as above.  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 Judeth Horn for endobronchial bx for definitive diagnosis of cardiac sarcoid  3. CAD - Atherosclerosis on HRCT - No chest pain - On semaglutide - On statin. Check lipids today.  4. Atrial fibrillation, longstanding persistent - CHA2DS2Vasc is 4 - Continue Xarelto. No bleeding issues.  5. Tobacco abuse - Smokes 1 ppd > 40 years - Discussed cessation.  6. Snoring - suspect sleep apnea - arrange sleep study  Keep follow up with Dr. Gasper Lloyd next month, as scheduled.  Prince Rome, FNP-BC 07/12/23

## 2023-07-12 ENCOUNTER — Ambulatory Visit (HOSPITAL_COMMUNITY)
Admission: RE | Admit: 2023-07-12 | Discharge: 2023-07-12 | Disposition: A | Discharge status: Home / Self Care | Payer: BC Managed Care – PPO | Source: Ambulatory Visit | Attending: Family Medicine

## 2023-07-12 ENCOUNTER — Encounter (HOSPITAL_COMMUNITY): Payer: Self-pay

## 2023-07-12 VITALS — BP 108/62 | HR 69 | Wt 304.0 lb

## 2023-07-12 DIAGNOSIS — E119 Type 2 diabetes mellitus without complications: Secondary | ICD-10-CM | POA: Diagnosis not present

## 2023-07-12 DIAGNOSIS — I4811 Longstanding persistent atrial fibrillation: Secondary | ICD-10-CM | POA: Insufficient documentation

## 2023-07-12 DIAGNOSIS — Z7984 Long term (current) use of oral hypoglycemic drugs: Secondary | ICD-10-CM | POA: Insufficient documentation

## 2023-07-12 DIAGNOSIS — I5022 Chronic systolic (congestive) heart failure: Secondary | ICD-10-CM | POA: Diagnosis not present

## 2023-07-12 DIAGNOSIS — F17210 Nicotine dependence, cigarettes, uncomplicated: Secondary | ICD-10-CM | POA: Diagnosis not present

## 2023-07-12 DIAGNOSIS — D8689 Sarcoidosis of other sites: Secondary | ICD-10-CM | POA: Diagnosis not present

## 2023-07-12 DIAGNOSIS — D869 Sarcoidosis, unspecified: Secondary | ICD-10-CM | POA: Diagnosis not present

## 2023-07-12 DIAGNOSIS — I110 Hypertensive heart disease with heart failure: Secondary | ICD-10-CM | POA: Diagnosis not present

## 2023-07-12 DIAGNOSIS — D6861 Antiphospholipid syndrome: Secondary | ICD-10-CM | POA: Insufficient documentation

## 2023-07-12 DIAGNOSIS — R0683 Snoring: Secondary | ICD-10-CM | POA: Diagnosis not present

## 2023-07-12 DIAGNOSIS — I4819 Other persistent atrial fibrillation: Secondary | ICD-10-CM | POA: Diagnosis not present

## 2023-07-12 DIAGNOSIS — Z7985 Long-term (current) use of injectable non-insulin antidiabetic drugs: Secondary | ICD-10-CM | POA: Insufficient documentation

## 2023-07-12 DIAGNOSIS — Z950 Presence of cardiac pacemaker: Secondary | ICD-10-CM | POA: Insufficient documentation

## 2023-07-12 DIAGNOSIS — I2510 Atherosclerotic heart disease of native coronary artery without angina pectoris: Secondary | ICD-10-CM | POA: Diagnosis not present

## 2023-07-12 DIAGNOSIS — Z7901 Long term (current) use of anticoagulants: Secondary | ICD-10-CM | POA: Insufficient documentation

## 2023-07-12 DIAGNOSIS — Z79899 Other long term (current) drug therapy: Secondary | ICD-10-CM | POA: Insufficient documentation

## 2023-07-12 DIAGNOSIS — I428 Other cardiomyopathies: Secondary | ICD-10-CM | POA: Insufficient documentation

## 2023-07-12 DIAGNOSIS — Z720 Tobacco use: Secondary | ICD-10-CM

## 2023-07-12 LAB — BASIC METABOLIC PANEL
Anion gap: 11 (ref 5–15)
BUN: 24 mg/dL — ABNORMAL HIGH (ref 6–20)
CO2: 30 mmol/L (ref 22–32)
Calcium: 10 mg/dL (ref 8.9–10.3)
Chloride: 95 mmol/L — ABNORMAL LOW (ref 98–111)
Creatinine, Ser: 1.6 mg/dL — ABNORMAL HIGH (ref 0.61–1.24)
GFR, Estimated: 51 mL/min — ABNORMAL LOW (ref >60)
Glucose, Bld: 166 mg/dL — ABNORMAL HIGH (ref 70–99)
Potassium: 3.8 mmol/L (ref 3.5–5.1)
Sodium: 136 mmol/L (ref 135–145)

## 2023-07-12 LAB — LIPID PANEL
Cholesterol: 141 mg/dL (ref 0–200)
HDL: 31 mg/dL — ABNORMAL LOW (ref >40)
LDL Cholesterol: 43 mg/dL (ref 0–99)
Total CHOL/HDL Ratio: 4.5 {ratio}
Triglycerides: 334 mg/dL — ABNORMAL HIGH (ref <150)
VLDL: 67 mg/dL — ABNORMAL HIGH (ref 0–40)

## 2023-07-12 LAB — BRAIN NATRIURETIC PEPTIDE: B Natriuretic Peptide: 96.3 pg/mL (ref 0.0–100.0)

## 2023-07-12 MED ORDER — ENTRESTO 24-26 MG PO TABS: 1.0000 | ORAL_TABLET | Freq: Two times a day (BID) | ORAL | 6 refills | Status: DC

## 2023-07-12 MED ORDER — HYDRALAZINE HCL 25 MG PO TABS: 25.0000 mg | ORAL_TABLET | Freq: Two times a day (BID) | ORAL | 3 refills | Status: DC

## 2023-07-12 MED ORDER — HYDRALAZINE HCL 25 MG PO TABS: 25.0000 mg | ORAL_TABLET | Freq: Three times a day (TID) | ORAL | 3 refills | Status: DC

## 2023-07-12 NOTE — Progress Notes (Signed)
ReDS Vest / Clip - 07/12/23 0850       ReDS Vest / Clip   Station Marker D    Ruler Value 38.5    ReDS Value Range Moderate volume overload    ReDS Actual Value 38    Anatomical Comments sitting

## 2023-07-12 NOTE — Patient Instructions (Addendum)
Thank you for coming in today  If you had labs drawn today, any labs that are abnormal the clinic will call you No news is good news  You have been order for sleep study [facility] will call you to make appointment    Medications: Stop Losartan Start Entresto 24/26 mg 1 tablet twice daily  Decrease Hydralazine to 25 mg 1 tablet twice daily  Follow up appointments:  Your physician recommends that you schedule a follow-up appointment in:  Keep follow up appointment With Dr. Gasper Lloyd    Do the following things EVERYDAY: Weigh yourself in the morning before breakfast. Write it down and keep it in a log. Take your medicines as prescribed Eat low salt foods--Limit salt (sodium) to 2000 mg per day.  Stay as active as you can everyday Limit all fluids for the day to less than 2 liters   At the [facility], you and your health needs are our priority. As part of our continuing mission to provide you with exceptional heart care, we have created designated Provider Care Teams. These Care Teams include your primary Cardiologist (physician) and Advanced Practice Providers (APPs- Physician Assistants and Nurse Practitioners) who all work together to provide you with the care you need, when you need it.   You may see any of the following providers on your designated Care Team at your next follow up: Dr Arvilla Meres Dr Marca Ancona Dr. Marcos Eke, NP Robbie Lis, [place] [facility], [place] Brynda Peon, NP Karle Plumber, PharmD   Please be sure to bring in all your medications bottles to every appointment.    Thank you for choosing [facility]  If you have any questions or concerns before your next appointment please send [place] a message through [place] or call our office at [phone].    TO LEAVE A MESSAGE FOR THE NURSE SELECT OPTION 2, PLEASE LEAVE A MESSAGE INCLUDING: YOUR  NAME DATE OF BIRTH CALL BACK NUMBER REASON FOR CALL**this is important as we prioritize the call backs  YOU WILL RECEIVE A CALL BACK THE SAME DAY AS LONG AS YOU CALL BEFORE 4:00 PM

## 2023-07-13 ENCOUNTER — Other Ambulatory Visit (HOSPITAL_COMMUNITY): Payer: Self-pay

## 2023-07-13 ENCOUNTER — Telehealth (HOSPITAL_COMMUNITY): Payer: Self-pay | Admitting: *Deleted

## 2023-07-13 MED ORDER — ICOSAPENT ETHYL 1 G PO CAPS: 2.0000 g | ORAL_CAPSULE | Freq: Two times a day (BID) | ORAL | 6 refills | Status: DC

## 2023-07-13 NOTE — Telephone Encounter (Signed)
Pt aware, agreeable, and verbalized understanding

## 2023-07-13 NOTE — Telephone Encounter (Addendum)
-----   Message from Jacklynn Ganong sent at 07/13/2023  8:02 AM EST ----- Would have him reach out to his PCP, may have a UTI. Please have him notify clinic if he does have UTI and we will hold his Jardiance ----- Message ----- From: Noralee Space, RN Sent: 07/12/2023   5:11 PM EST To: Jacklynn Ganong, FNP  Pt aware, agreeable, and verbalized understanding, reports he is taking Atorvastatin, new rx for vascepa sent in. Pt also reports he has been having burning with urination since last night, and it has gotten worse through the day, states he started [place] about a week or so ago, will send to provider for further recommendations  Ett[place], Anderson Malta, FNP  P [facility] Triage Pool Renal function up after diuresis. Change torsemide to 20 mg every other day.  Repeat BMET in 7-10 days  LDL looks good but TGs elevated. He is on atorvastatin 40 mg daily.  Please start Vascepa 2 g bid to lower TGs.

## 2023-07-17 DIAGNOSIS — Z419 Encounter for procedure for purposes other than remedying health state, unspecified: Secondary | ICD-10-CM | POA: Diagnosis not present

## 2023-07-19 ENCOUNTER — Encounter: Payer: Self-pay | Admitting: Family Medicine

## 2023-07-26 ENCOUNTER — Ambulatory Visit: Payer: BC Managed Care – PPO | Admitting: Student in an Organized Health Care Education/Training Program

## 2023-07-26 ENCOUNTER — Ambulatory Visit (INDEPENDENT_AMBULATORY_CARE_PROVIDER_SITE_OTHER): Payer: BC Managed Care – PPO | Admitting: Physician Assistant

## 2023-07-26 DIAGNOSIS — M5412 Radiculopathy, cervical region: Secondary | ICD-10-CM | POA: Diagnosis not present

## 2023-07-26 NOTE — Progress Notes (Addendum)
Patient underwent C7-T1 Left sided injection.  He did have improvement in his pain.  He continues to have some pain from his neck into his left arm with numbness and tingling in his pinky and ring finger.  He went to 1 physical therapy session in December, but did not want to continue going because he stated it was too hard on his heart and he was concerned.  At this time he feels a pain is more under control he does not want to pursue any other additional interventions at this time.  He was encouraged to reach out to [place] for questions or concerns in the future.  I connected with  Christopher Green on 07/26/23 via telephone and verified that I am speaking with the correct person using two identifiers. We spoke for ten minutes. I was present in my office and he was at his private residence.   I discussed the limitations of evaluation and management by telemedicine. The patient expressed understanding and agreed to proceed.   Marland Kitchentele

## 2023-07-29 ENCOUNTER — Other Ambulatory Visit: Payer: Self-pay | Admitting: Family Medicine

## 2023-08-02 NOTE — Progress Notes (Signed)
ADVANCED HEART FAILURE CLINIC NOTE  Referring Physician: Sandre Kitty, MD  Primary Care: Sandre Kitty, MD Primary Cardiologist: Dr. Servando Salina HF: Dr. 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 first established care with [facility] in October 2019 when he had an abnormal nuclear stress test followed by left heart catheterization in 03/2018 with reportedly minimal plaque.  During that time he was also seen for uncontrolled hypertension in the emergency department.  He was not seen by [facility] health cardiology until December 2023 when he presented to [facility] long with complaints of shortness of breath and a systolic blood pressure over 200 and heart rate of 40.  CTA PE on admission was significant for acute segmental and subsegmental pulmonary emboli.  While admitted patient was also significantly bradycardic with Mobitz 2 heart block.  Echocardiogram during admission with LVEF of 65 to 70%.  Due to persistent symptoms of dyspnea and 2-1 AV block with right bundle branch block on EKG he underwent placement of permanent pacemaker by Dr. Sharlot Gowda in 2024.  Despite placement of permanent pacemaker he had multiple episodes of syncope in August 2024.  Device interrogation by EP with atrial fibrillation with controlled ventricular rates and atrial flutter.  During this time he also had intermittent low blood pressures that were associated with his syncope but etiology could not be determined.  He subsequently underwent cardiac MRI on 10/24 which demonstrated severe LVH of the septum with multifocal LGE that was not present in the areas of hypertrophy.  Patient subsequently sent to heart failure clinic for further evaluation.  No reported family  history of infiltrative heart disease, autoimmune disease, sarcoid or celiac that/need for pacemakers in their family.  They do have some history of coronary artery disease that appears to be unrelated.  Interval hx:  Since his last appt, he has had a HRCT which was notable for hilar adenopathy. He has continued to struggle with severe back pain now s/p injections for pain control. From a HFrEF standpoint, he becomes short of breath when walking more than 77ft but reports significant improvement from his prior evaluation. He no longer has lower extremity edema and has had some improvement in abdominal protuberance.   Current Outpatient Medications  Medication Sig Dispense Refill   albuterol (VENTOLIN HFA) 108 (90 Base) MCG/ACT inhaler Inhale 2 puffs into the lungs every 6 (six) hours as needed for wheezing or shortness of breath. 8 g 0   atorvastatin (LIPITOR) 40 MG tablet Take 1 tablet (40 mg total) by mouth daily. 90 tablet 3   carvedilol (COREG) 3.125 MG tablet TAKE 1 TABLET BY MOUTH 2 TIMES DAILY WITH A MEAL. 60 tablet 3   cetirizine (ZYRTEC) 10 MG tablet Take 1 tablet (10 mg total) by mouth daily. 30 tablet 11   empagliflozin (JARDIANCE) 10 MG TABS tablet Take 1 tablet (10 mg total) by mouth daily before breakfast. 30 tablet 6   icosapent Ethyl (VASCEPA) 1 g capsule Take 2 capsules (2 g total) by mouth 2 (two) times daily. 120 capsule 6   metFORMIN (GLUCOPHAGE) 500 MG tablet TAKE 1 TABLET BY MOUTH DAILY 60 tablet 1   oxyCODONE-acetaminophen (PERCOCET) 10-325 MG tablet Take one tablet every 4 to 6 hours as needed for pain 150  tablet 0   potassium chloride SA (KLOR-CON M) 20 MEQ tablet Take 1 tablet (20 mEq total) by mouth 2 (two) times daily. 33 tablet 6   rivaroxaban (XARELTO) 20 MG TABS tablet Take 1 tablet (20 mg total) by mouth daily with supper. 90 tablet 3   sacubitril-valsartan (ENTRESTO) 24-26 MG Take 1 tablet by mouth 2 (two) times daily. 60 tablet 6   sildenafil (VIAGRA) 100 MG tablet  Take 0.5-1 tablets (50-100 mg total) by mouth daily as needed for erectile dysfunction. 10 tablet 11   spironolactone (ALDACTONE) 25 MG tablet Take 0.5 tablets (12.5 mg total) by mouth daily.     testosterone cypionate (DEPOTESTOSTERONE CYPIONATE) 200 MG/ML injection Inject 1 mL (200 mg total) into the muscle every 14 (fourteen) days. 10 mL 1   torsemide (DEMADEX) 20 MG tablet Take 1 tablet (20 mg total) by mouth daily. 33 tablet 6   DULoxetine (CYMBALTA) 30 MG capsule Take 1 capsule (30 mg total) by mouth 2 (two) times daily. (Patient taking differently: Take 30 mg by mouth 2 (two) times daily. As needed) 180 capsule 1   No current facility-administered medications for this encounter.    No Known Allergies  PHYSICAL EXAM: Vitals:   08/03/23 0905  BP: 100/60  Pulse: 67  SpO2: 97%   GENERAL: NAD Lungs- CTA CARDIAC:  JVP: 6 cm          Normal rate with regular rhythm. No murmur.  Pulses 1+. No edema.  ABDOMEN: Soft, non-tender, non-distended.  EXTREMITIES: Warm and well perfused.  NEUROLOGIC: No obvious FND   DATA REVIEW  ECG: 05/28/23: Atrial flutter with intermittent pacing as per my personal interpretation  ECHO: 02/12/23: LVEF 60 to 65% with normal RV function.  Severe LVH as per my personal interpretation  CMR (04/12/23): 1. Mild decrease in LVEF, LVEF 46%, in the setting of free breathing artifact.  2. There is severe hypertrophy of the LV septum. Though this meets criteria for hypertrophic cardiomyopathy, this is multi-focal late gadolinium enhancement with increase in ECV signal; this is not present in the areas of hypertrophy. Consider PET imaging for inflammatory cardiomyopathy evaluation.  Cardiac PET (05/19/23):   FDG uptake findings are consistent with active myocardial inflammation/sarcoidosis.   FDG uptake was observed. FDG uptake was focal. FDG uptake was present in the apical to basal lateral, apical anterior and apex segment(s). LV perfusion is abnormal.  Defect 1: There is a small defect with mild reduction in uptake present in the apical inferior location(s).   Left ventricular function is abnormal. Global function is moderately reduced. EF: 36%.   Coronary calcium was present on the attenuation correction CT images. Moderate coronary calcifications were present. Coronary calcifications were present in the left anterior descending artery, left circumflex artery and right coronary artery distribution(s).   Electronically signed by Epifanio Lesches, MD  ASSESSMENT & PLAN:  Heart failure with mildly reduced EF, Evaluation for infiltrative cardiomyopathy Etiology of HF: Nonischemic cardiomyopathy with significant LVH.  Although he does not have the typical characteristics of cardiac sarcoid he does have patchy LGE on cardiac MRI.  Interestingly he has severe LVH that would be more consistent with hypertrophic cardiomyopathy.  Obtaining genetic panel today to rule out HCM. Cardiac PET consistent with sarcoid. He has now had a HRCT which has some hilar adenopathy. I discussed with Dr. Judeth Horn today. Will be evaluated by pulmonology for endobronchial biopsy.  NYHA class / AHA Stage: Difficult to assess, he is a very poor historian.  Likely  NYHA IIb-III Volume status & Diuretics: Euvolemic on exam, continue torsemide 20 mg daily Vasodilators: Entresto 24/26mg  BID; D/C hydralazine. Repeat BMP today.  Beta-Blocker: Coreg 3.125 mg twice daily MRA: Spironolactone 12.5 mg daily Cardiometabolic: Jardiance 10 mg daily Devices therapies & Valvulopathies: Dual-chamber permanent pacemaker placed by Dr. Ladona Ridgel for advanced AV block. Advanced therapies: Not indicated  2. Cardiac sarcoid - Discussed with Dr. Judeth Horn; will see pulm for possible EBUS - Quantiferon-gold today - Will review vaccination record - Plan to start dual therapy with prednisone & cellcept within the next month pending biopsy and above work-up.   3. Persistent atrial  fibrillation/flutter -Noted to be in persistent atrial fibrillation since March 2024 -Continue Xarelto 20 mg daily -Followed by Dr. Ladona Ridgel -Continue Coreg 3.125 mg twice daily -EKG today with persistent atrial fibrillation; once EBUS is completed with plan on cardioversion.   4. Coronary artery disease -Nonobstructive mild CAD per left heart cath in 2019. -stable with no angina  5. Hyperlipidemia -Continue Lipitor 40 mg daily  6. History of PE/DVT and antiphospholipid antibody syndrome -Will require lifelong anticoagulation with Xarelto 20 mg daily.  7. Severe back pain - Followed by NSGY - Based on chart review from 07/26/23, saw Joan Flores; underwent C7-T1 injection for pain control.   Croy Drumwright Advanced Heart Failure Mechanical Circulatory Support

## 2023-08-03 ENCOUNTER — Encounter (HOSPITAL_COMMUNITY): Payer: Self-pay | Admitting: Cardiology

## 2023-08-03 ENCOUNTER — Other Ambulatory Visit (HOSPITAL_COMMUNITY): Payer: Self-pay

## 2023-08-03 ENCOUNTER — Telehealth (HOSPITAL_COMMUNITY): Payer: Self-pay

## 2023-08-03 ENCOUNTER — Ambulatory Visit (HOSPITAL_COMMUNITY)
Admission: RE | Admit: 2023-08-03 | Discharge: 2023-08-03 | Disposition: A | Discharge status: Home / Self Care | Payer: BC Managed Care – PPO | Source: Ambulatory Visit | Attending: Cardiology | Admitting: Cardiology

## 2023-08-03 VITALS — BP 100/60 | HR 67 | Wt 299.6 lb

## 2023-08-03 DIAGNOSIS — D6861 Antiphospholipid syndrome: Secondary | ICD-10-CM | POA: Diagnosis not present

## 2023-08-03 DIAGNOSIS — I110 Hypertensive heart disease with heart failure: Secondary | ICD-10-CM | POA: Insufficient documentation

## 2023-08-03 DIAGNOSIS — I428 Other cardiomyopathies: Secondary | ICD-10-CM | POA: Diagnosis not present

## 2023-08-03 DIAGNOSIS — I442 Atrioventricular block, complete: Secondary | ICD-10-CM | POA: Insufficient documentation

## 2023-08-03 DIAGNOSIS — I2510 Atherosclerotic heart disease of native coronary artery without angina pectoris: Secondary | ICD-10-CM | POA: Insufficient documentation

## 2023-08-03 DIAGNOSIS — M549 Dorsalgia, unspecified: Secondary | ICD-10-CM | POA: Diagnosis not present

## 2023-08-03 DIAGNOSIS — Z86711 Personal history of pulmonary embolism: Secondary | ICD-10-CM | POA: Diagnosis not present

## 2023-08-03 DIAGNOSIS — Z7901 Long term (current) use of anticoagulants: Secondary | ICD-10-CM | POA: Insufficient documentation

## 2023-08-03 DIAGNOSIS — Z86718 Personal history of other venous thrombosis and embolism: Secondary | ICD-10-CM | POA: Diagnosis not present

## 2023-08-03 DIAGNOSIS — E785 Hyperlipidemia, unspecified: Secondary | ICD-10-CM | POA: Diagnosis not present

## 2023-08-03 DIAGNOSIS — Z79899 Other long term (current) drug therapy: Secondary | ICD-10-CM | POA: Diagnosis not present

## 2023-08-03 DIAGNOSIS — I10 Essential (primary) hypertension: Secondary | ICD-10-CM | POA: Diagnosis not present

## 2023-08-03 DIAGNOSIS — D869 Sarcoidosis, unspecified: Secondary | ICD-10-CM | POA: Diagnosis not present

## 2023-08-03 DIAGNOSIS — I441 Atrioventricular block, second degree: Secondary | ICD-10-CM

## 2023-08-03 DIAGNOSIS — I4892 Unspecified atrial flutter: Secondary | ICD-10-CM | POA: Insufficient documentation

## 2023-08-03 DIAGNOSIS — I4819 Other persistent atrial fibrillation: Secondary | ICD-10-CM | POA: Diagnosis not present

## 2023-08-03 DIAGNOSIS — Z7984 Long term (current) use of oral hypoglycemic drugs: Secondary | ICD-10-CM | POA: Insufficient documentation

## 2023-08-03 DIAGNOSIS — I5022 Chronic systolic (congestive) heart failure: Secondary | ICD-10-CM | POA: Diagnosis not present

## 2023-08-03 DIAGNOSIS — D8685 Sarcoid myocarditis: Secondary | ICD-10-CM

## 2023-08-03 DIAGNOSIS — Z950 Presence of cardiac pacemaker: Secondary | ICD-10-CM | POA: Diagnosis not present

## 2023-08-03 LAB — COMPREHENSIVE METABOLIC PANEL
ALT: 29 U/L (ref 0–44)
AST: 38 U/L (ref 15–41)
Albumin: 3.7 g/dL (ref 3.5–5.0)
Alkaline Phosphatase: 42 U/L (ref 38–126)
Anion gap: 12 (ref 5–15)
BUN: 19 mg/dL (ref 6–20)
CO2: 27 mmol/L (ref 22–32)
Calcium: 9.4 mg/dL (ref 8.9–10.3)
Chloride: 95 mmol/L — ABNORMAL LOW (ref 98–111)
Creatinine, Ser: 1.51 mg/dL — ABNORMAL HIGH (ref 0.61–1.24)
GFR, Estimated: 55 mL/min — ABNORMAL LOW (ref >60)
Glucose, Bld: 177 mg/dL — ABNORMAL HIGH (ref 70–99)
Potassium: 4.4 mmol/L (ref 3.5–5.1)
Sodium: 134 mmol/L — ABNORMAL LOW (ref 135–145)
Total Bilirubin: 0.7 mg/dL (ref 0.0–1.2)
Total Protein: 6.7 g/dL (ref 6.5–8.1)

## 2023-08-03 NOTE — Progress Notes (Signed)
GENETIC TESTING REDRAWN FOR HCM  DUE TO ISSUES WITH PROCESSING AT PREVIOUS   ITAMAR home sleep study given to patient, all instructions explained, waiver signed, and CLOUDPAT registration complete.

## 2023-08-03 NOTE — Patient Instructions (Addendum)
Medication Changes:  STOP: HYDRALAZINE   PLEASE CONTACT JARDIANCE SUPPORT FOR SAVINGS CARD TO USE WITH PRESCRIPTION   Lab Work:  Labs done today, your results will be available in MyChart, we will contact you for abnormal readings.  Testing/Procedures:  Your provider has recommended that you have a home sleep study (Itamar Test).  We have provided you with the equipment in our office today. Please go ahead and download the app. DO NOT OPEN OR TAMPER WITH THE BOX UNTIL WE ADVISE YOU TO DO SO. Once insurance has approved the test our office will call you with PIN number and approval to proceed with testing. Once you have completed the test you just dispose of the equipment, the information is automatically uploaded to [place] via blue-tooth technology. If your test is positive for sleep apnea and you need a home CPAP machine you will be contacted by Dr Norris Cross office [facility]) to set this up.   Referrals:  YOU HAVE BEEN REFERRED TO PULMONOLOGY THEY WILL REACH OUT TO YOU OR CALL TO ARRANGE THIS. PLEASE CALL us WITH ANY CONCERNS   Follow-Up in: 1 MONTH AS SCHEDULED   At the Advanced Heart Failure Clinic, you and your health needs are our priority. We have a designated team specialized in the treatment of Heart Failure. This Care Team includes your primary Heart Failure Specialized Cardiologist (physician), Advanced Practice Providers (APPs- Physician Assistants and Nurse Practitioners), and Pharmacist who all work together to provide you with the care you need, when you need it.   You may see any of the following providers on your designated Care Team at your next follow up:  Dr. Arvilla Meres Dr. Marca Ancona Dr. Dorthula Nettles Dr. Theresia Bough Tonye Becket, NP Robbie Lis, [place] [facility], [place] Brynda Peon, NP [place] Lee, NP Karle Plumber, PharmD   Please be sure to bring in all your medications bottles to every appointment.   Need to Contact us:  If you  have any questions or concerns before your next appointment please send [place] a message through [place] or call our office at [phone].    TO LEAVE A MESSAGE FOR THE NURSE SELECT OPTION 2, PLEASE LEAVE A MESSAGE INCLUDING: YOUR NAME DATE OF BIRTH CALL BACK NUMBER REASON FOR CALL**this is important as we prioritize the call backs  YOU WILL RECEIVE A CALL BACK THE SAME DAY AS LONG AS YOU CALL BEFORE 4:00 PM

## 2023-08-03 NOTE — Telephone Encounter (Signed)
Patient Advocate Encounter  Test billing for [place] shows that this plan limits 30 days and has a copay of $70. This patient is eligible to use a copay card that would bring the cost down to $10 per month.  Burnell Blanks, CPhT Rx Patient Advocate Phone: [phone]

## 2023-08-05 ENCOUNTER — Other Ambulatory Visit: Payer: Self-pay | Admitting: Emergency Medicine

## 2023-08-05 DIAGNOSIS — R590 Localized enlarged lymph nodes: Secondary | ICD-10-CM

## 2023-08-05 NOTE — Progress Notes (Signed)
EBUS request orders placed

## 2023-08-06 ENCOUNTER — Other Ambulatory Visit: Payer: Self-pay | Admitting: Family Medicine

## 2023-08-06 ENCOUNTER — Encounter: Payer: Self-pay | Admitting: Emergency Medicine

## 2023-08-06 LAB — QUANTIFERON-TB GOLD PLUS (RQFGPL)
QuantiFERON Mitogen Value: >10 [IU]/mL
QuantiFERON Nil Value: 0.32 [IU]/mL
QuantiFERON TB1 Ag Value: 0.18 [IU]/mL
QuantiFERON TB2 Ag Value: 0.29 [IU]/mL

## 2023-08-06 LAB — QUANTIFERON-TB GOLD PLUS: QuantiFERON-TB Gold Plus: NEGATIVE

## 2023-08-10 ENCOUNTER — Encounter: Payer: Self-pay | Admitting: Family Medicine

## 2023-08-10 ENCOUNTER — Ambulatory Visit (INDEPENDENT_AMBULATORY_CARE_PROVIDER_SITE_OTHER): Payer: BC Managed Care – PPO | Admitting: Family Medicine

## 2023-08-10 VITALS — BP 101/65 | HR 81 | Ht 73.0 in | Wt 306.1 lb

## 2023-08-10 DIAGNOSIS — I428 Other cardiomyopathies: Secondary | ICD-10-CM | POA: Diagnosis not present

## 2023-08-10 DIAGNOSIS — G894 Chronic pain syndrome: Secondary | ICD-10-CM

## 2023-08-10 DIAGNOSIS — E1169 Type 2 diabetes mellitus with other specified complication: Secondary | ICD-10-CM

## 2023-08-10 DIAGNOSIS — E291 Testicular hypofunction: Secondary | ICD-10-CM | POA: Diagnosis not present

## 2023-08-10 DIAGNOSIS — Z7984 Long term (current) use of oral hypoglycemic drugs: Secondary | ICD-10-CM

## 2023-08-10 LAB — POCT GLYCOSYLATED HEMOGLOBIN (HGB A1C): HbA1c POC (<> result, manual entry): 7.6 % (ref 4.0–5.6)

## 2023-08-10 MED ORDER — METFORMIN HCL ER 750 MG PO TB24: 1500.0000 mg | ORAL_TABLET | Freq: Every day | ORAL | 1 refills | Status: DC

## 2023-08-10 NOTE — Assessment & Plan Note (Addendum)
 A1c above goal.  He has stopped his Ozempic due to side effects including nausea and vomiting.  Also takes Jardiance 10 mg.  Would be hesitant to increase it to 25 given his episodes of hypotension in the past.  Will increase his metformin and change it to metformin XR 1500 mg.

## 2023-08-10 NOTE — Patient Instructions (Signed)
 It was nice to see you today,  We addressed the following topics today: -Your A1c was elevated today at 7.6%.  I am increasing your metformin dose.  I am going to send in a prescription for 750 mg metformin XR to take twice a day.  Take this instead of your current metformin. - I will hold off on increasing the Jardiance due to your issues with low blood pressure in the past. - When your disability paperwork is ready we will call you to pick it up.   Have a great day,  Frederic Jericho, MD

## 2023-08-10 NOTE — Progress Notes (Signed)
   Established Patient Office Visit  Subjective   Patient ID: Christopher Green, male    DOB: 1970/12/26  Age: 53 y.o. MRN: [id number]  Chief Complaint  Patient presents with   Medical Management of Chronic Issues    HPI  CHF-patient has endotracheal biopsy of hilar adenopathy next week.  Chronic pain-patient had a C7-T1 injection recently.  Feels like it is helping his upper back and neck but he has a flareup of his low back pain recently.  He is planning on doing physical therapy prior to any additional low back surgery but would like to take care of his cardiac workup first.  Patient is currently taking a half a milliliter of testosterone every other week.  Last administered on Sunday.  DM2-patient no longer taking Ozempic.  He got nausea and vomiting with all doses of it.   The 10-year ASCVD risk score (Arnett DK, et al., 2019) is: 14.4%  Health Maintenance Due  Topic Date Due   COVID-19 Vaccine (1) Never done   Pneumococcal Vaccine 20-79 Years old (1 of 2 - PCV) Never done   OPHTHALMOLOGY EXAM  Never done   Hepatitis C Screening  Never done   Zoster Vaccines- Shingrix (1 of 2) Never done   Colonoscopy  Never done      Objective:     BP 101/65   Pulse 81   Ht 6\' 1"  (1.854 m)   Wt (!) 306 lb 1.9 oz (138.9 kg)   SpO2 95%   BMI 40.39 kg/m    Physical Exam General: Alert, oriented Pulmonary: No respiratory distress Psych: Pleasant affect Extremities: No pedal edema.   Results for orders placed or performed in visit on 08/10/23  POCT HgB A1C  Result Value Ref Range   Hemoglobin A1C     HbA1c POC (<> result, manual entry) 7.6 4.0 - 5.6 %   HbA1c, POC (prediabetic range)     HbA1c, POC (controlled diabetic range)          Assessment & Plan:   Type 2 diabetes mellitus with other specified complication, without long-term current use of insulin (HCC) Assessment & Plan: A1c above goal.  He has stopped his Ozempic due to side effects including nausea and  vomiting.  Also takes Jardiance 10 mg.  Would be hesitant to increase it to 25 given his episodes of hypotension in the past.  Will increase his metformin and change it to metformin XR 1500 mg.  Orders: -     POCT glycosylated hemoglobin (Hb A1C)  Chronic pain syndrome Assessment & Plan: Recently had C7-T1 injection.  Continues to see neurosurgery.  Is planning on doing physical therapy following his cardiology workup.  Continue his opioid pain medication at current dose.   Hypogonadism in male Assessment & Plan: Continue testosterone every other week at 0.5 mL or 100 mg.  Will recheck testosterone level later this week.   Nonischemic cardiomyopathy [facility]) Assessment & Plan: Currently sees heart failure clinic.  Has a pacemaker.  Has upcoming endobronchial biopsy to help with diagnosis possible cardiac sarcoid as a potential cause.  He   Other orders -     metFORMIN HCl ER; Take 2 tablets (1,500 mg total) by mouth daily.  Dispense: 180 tablet; Refill: 1     Return in about 3 months (around 11/07/2023) for DM.    Sandre Kitty, MD

## 2023-08-10 NOTE — Assessment & Plan Note (Signed)
 Currently sees heart failure clinic.  Has a pacemaker.  Has upcoming endobronchial biopsy to help with diagnosis possible cardiac sarcoid as a potential cause.  He

## 2023-08-10 NOTE — Assessment & Plan Note (Signed)
 Continue testosterone every other week at 0.5 mL or 100 mg.  Will recheck testosterone level later this week.

## 2023-08-10 NOTE — Assessment & Plan Note (Signed)
 Recently had C7-T1 injection.  Continues to see neurosurgery.  Is planning on doing physical therapy following his cardiology workup.  Continue his opioid pain medication at current dose.

## 2023-08-13 ENCOUNTER — Other Ambulatory Visit: Payer: Self-pay

## 2023-08-13 ENCOUNTER — Encounter: Payer: Self-pay | Admitting: Internal Medicine

## 2023-08-13 ENCOUNTER — Encounter (HOSPITAL_COMMUNITY): Payer: Self-pay | Admitting: Emergency Medicine

## 2023-08-13 NOTE — Progress Notes (Addendum)
 PCP - Sandre Kitty, MD  Cardiologist - Thomasene Ripple, DO Electrophysiology - Cardiolog Marinus Maw, MD   PPM/ICD - Pacemaker Device Orders - yes Rep Notified    Chest x-ray - 12-08-22 EKG - 08-03-23 Stress Test - 03-2018 ECHO - 02-12-23 Cardiac Cath - 2019  CPAP - Awaiting for insurance to approve testing   Fasting Blood Sugar - Per patient between 130-140 Checks Blood Sugar Daily per patient metFORMIN (GLUCOPHAGE-XR) Do not take day of procedure empagliflozin (JARDIANCE) Last dose 08-10-26    Blood Thinner Instructions: rivaroxaban (XARELTO) last dose 08-13-23 Aspirin Instructions: denies  ERAS Protcol - NPO  COVID TEST- no  Anesthesia review: yes  Patient verbally denies any shortness of breath, fever, cough and chest pain during phone call   -------------  SDW INSTRUCTIONS given:  Your procedure is scheduled on August 16, 2023.  Report to [facility] Main Entrance "A" at 10:00 A.M., and check in at the Admitting office.  Call this number if you have problems the morning of surgery:  [phone]   Remember:  Do not eat or drink after midnight the night before your surgery      Take these medicines the morning of surgery with A SIP OF WATER  atorvastatin (LIPITOR)  carvedilol (COREG)  cetirizine (ZYRTEC)  DULoxetine (CYMBALTA)  icosapent Ethyl (VASCEPA)  pregabalin (LYRICA)    IF NEEDED albuterol (VENTOLIN HFA) PLEASE BRING WITH YOU oxyCODONE-acetaminophen (PERCOCET)   rivaroxaban (XARELTO) Last dose 08-13-23 sacubitril-valsartan (ENTRESTO) Last dose 08-13-23 As of today, STOP taking any Aspirin (unless otherwise instructed by your surgeon) Aleve, Naproxen, Ibuprofen, Motrin, Advil, Goody's, BC's, all herbal medications, fish oil, and all vitamins.                      Do not wear jewelry, make up, or nail polish            Do not wear lotions, powders, perfumes/colognes, or deodorant.            Do not shave 48 hours prior to surgery.  Men may  shave face and neck.            Do not bring valuables to the hospital.            [facility] is not responsible for any belongings or valuables.  Do NOT Smoke (Tobacco/Vaping) 24 hours prior to your procedure If you use a CPAP at night, you may bring all equipment for your overnight stay.   Contacts, glasses, dentures or bridgework may not be worn into surgery.      For patients admitted to the hospital, discharge time will be determined by your treatment team.   Patients discharged the day of surgery will not be allowed to drive home, and someone needs to stay with them for 24 hours.    Special instructions:   [place]- Preparing For Surgery  Before surgery, you can play an important role. Because skin is not sterile, your skin needs to be as free of germs as possible. You can reduce the number of germs on your skin by washing with CHG (chlorahexidine gluconate) Soap before surgery.  CHG is an antiseptic cleaner which kills germs and bonds with the skin to continue killing germs even after washing.    Oral Hygiene is also important to reduce your risk of infection.  Remember - BRUSH YOUR TEETH THE MORNING OF SURGERY WITH YOUR REGULAR TOOTHPASTE  Please do not use if you have an  allergy to CHG or antibacterial soaps. If your skin becomes reddened/irritated stop using the CHG.  Do not shave (including legs and underarms) for at least 48 hours prior to first CHG shower. It is OK to shave your face.  Please follow these instructions carefully.   Shower the NIGHT BEFORE SURGERY and the MORNING OF SURGERY with DIAL Soap.   Pat yourself dry with a CLEAN TOWEL.  Wear CLEAN PAJAMAS to bed the night before surgery  Place CLEAN SHEETS on your bed the night of your first shower and DO NOT SLEEP WITH PETS.   Day of Surgery: Please shower morning of surgery  Wear Clean/Comfortable clothing the morning of surgery Do not apply any deodorants/lotions.   Remember to brush your teeth WITH  YOUR REGULAR TOOTHPASTE.   Questions were answered. Patient verbalized understanding of instructions.

## 2023-08-13 NOTE — Progress Notes (Signed)
 PERIOPERATIVE PRESCRIPTION FOR IMPLANTED CARDIAC DEVICE PROGRAMMING  Patient Information: Name:  Christopher Green  DOB:  Oct 07, 1970  MRN:  [id number]    Planned Procedure:  Video bronchoscopy with endobronchial ultrasound  Surgeon:  Dr. Levy Pupa  Date of Procedure:  08/16/2023  Cautery will be used.  Position during surgery:  Supine   Please send documentation back to:  [facility] (Fax # [phone])   Device Information:  Clinic EP Physician:  Lewayne Bunting, MD   Device Type:  Pacemaker Manufacturer and Phone #:  [employer]: [phone] Pacemaker Dependent?:  No. Date of Last Device Check:  05/17/2023 Normal Device Function?:  Yes.    Electrophysiologist's Recommendations:  Have magnet available. Provide continuous ECG monitoring when magnet is used or reprogramming is to be performed.  Procedure may interfere with device function.  Magnet should be placed over device during procedure.  Per Device Clinic Standing Orders, Lenor Coffin, RN  8:38 AM 08/13/2023

## 2023-08-13 NOTE — Progress Notes (Signed)
 Anesthesia Chart Review: Christopher Green  Case: [id number] Date/Time: 08/16/23 1230   Procedure: VIDEO BRONCHOSCOPY WITH ENDOBRONCHIAL ULTRASOUND   Anesthesia type: General   Pre-op diagnosis: MEDIASTINAL ADENOTATHY   [facility] / [facility]   Surgeons: Leslye Peer, MD       DISCUSSION: Patient is a 53 year old male scheduled for the above procedure.  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   He is followed by multiple cardiologists including Dr. Servando Salina, Dr. Graciela Husbands (EP), and most recently established with [facility]. Gasper [facility]. He had a false negative stress test in 2019 with only minimal CAD by LHC. In December 2023 he presented to ED with worsening SOB, elevated BP (SBP > 200) and bradycardia (HR 40). He was diagnosed with PE and LLE DVT. EKG showed Mobitz 2 heart block. He ultimately underwent PPM on 08/17/22. Despite this, he had recurrent syncope with intermittent hypotension. PPM interrogation showed > 85% afib/flutter burden, controlled rate. He subsequently had a cMRI in 03/2023 demonstrating severe septal LVH with multifocal LGE that was not present in the area of hypertrophy so PET imaging advised to look for inflammatory cardiomyopathy. He had a NM PET CT Myocardial Sarcoidosis Study on 05/19/23 with FDG uptake findings consistent with active myocardial inflammation/sarcoidosis. A high resolution chest CT was done on 05/31/23 showing multiple  prominent borderline enlarged mediastinal and bilateral hilar lymph nodes. Dr. Gasper Lloyd recommended a genetic panel to rule out HCM. He also discussed with pulmonologist plan for EBUS to further evaluate for sarcoidosis. Pending biopsy results will consider starting him on prednisone and Cellcept. 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 He will also be considered for cardioversion of afib afterwards.    EP PPM Perioperative Recommendations: Device Information: Clinic EP Physician:  Lewayne Bunting, MD  Device Type:  Pacemaker Manufacturer and Phone #:  St. Jude/Abbott: [phone] Pacemaker Dependent?:  No. Date of Last Device Check:  05/17/2023     Normal Device Function?:  Yes.     Electrophysiologist's Recommendations: Have magnet available. Provide continuous ECG monitoring when magnet is used or reprogramming is to be performed.  Procedure may interfere with device function.  Magnet should be placed over device during procedure.   He had routine follow-up for ongoing medical management of chronic issues by his PCP Sandre Kitty, MD on 08/10/23. Note reviewed. He is aware of surgery plans. A1c 7.6% on 08/10/23.   He is for labs as indicated and anesthesia team evaluation on the day of surgery. Per pulmonology instructions: "Stop Eliquis 2 days prior. No aspirin 2 days prior".   VS:  Wt Readings from Last 3 Encounters:  08/10/23 (!) 138.9 kg  08/03/23 135.9 kg  07/12/23 (!) 137.9 kg   BP Readings from Last 3 Encounters:  08/10/23 101/65  08/03/23 100/60  07/12/23 108/62   Pulse Readings from Last 3 Encounters:  08/10/23 81  08/03/23 67  07/12/23 69     PROVIDERS: Sandre Kitty, MD is PCP  Dorthula Nettles, DO is HF cardiologist Lewayne Bunting, MD is EP Thomasene Ripple, DO is primary cardiologist  Serena Croissant, MD  is HEM. Hypercoagulability workup negative for Factor V Leiden, protein C, protein S and Antithrombin III, but positive for Lupus anticoagulant and elevated  factor VIII. Lifelong anticoagulation advised. If PCP manages anticoagulation then as needed HEM follow-up recommended at 09/24/22 visit.    LABS: Last lab results in [facility] include: Lab Results  Component Value Date   WBC 9.2 02/09/2023   HGB 14.1 02/09/2023   HCT 41.2 02/09/2023   PLT 270 02/09/2023   GLUCOSE 177 (H) 08/03/2023   CHOL 141 07/12/2023   TRIG 334 (H) 07/12/2023   HDL 31 (L) 07/12/2023   LDLCALC 43 07/12/2023   ALT 29 08/03/2023   AST 38 08/03/2023   NA 134 (L) 08/03/2023   K 4.4 08/03/2023   CL 95 (L) 08/03/2023   CREATININE 1.51 (H) 08/03/2023   BUN 19 08/03/2023   CO2 27 08/03/2023   TSH 1.030 02/09/2023   INR 1.1 05/25/2022   HGBA1C 7.6 08/10/2023    IMAGES: CT Chest High Resolution 05/31/23: MPRESSION: 1. No evidence of interstitial lung disease. 2. Multiple prominent borderline enlarged mediastinal and bilateral hilar lymph nodes compatible with reported clinical history of sarcoidosis. 3. Aortic atherosclerosis, in addition to three-vessel coronary artery disease. Please note that although the presence of coronary artery calcium documents the presence of coronary artery disease, the severity of this disease and any potential stenosis cannot be assessed on this non-gated CT examination. Assessment for potential risk factor modification, dietary therapy or pharmacologic therapy may be warranted, if clinically indicated. 4. There are calcifications of the aortic valve. Echocardiographic correlation for evaluation of potential valvular dysfunction may be warranted if clinically indicated. 5. Hepatic steatosis. - Aortic Atherosclerosis (ICD10-I70.0).  CT Head 02/10/23:  IMPRESSION: 1. No hemorrhage or CT evidence of an acute cortical infarct 2. Chronic left orbital floor fracture.    EKG: 08/03/23: Atrial fibrillation at 67 bpm with frequent ventricular-paced complexes Right bundle branch block Lateral infarct , age undetermined Confirmed by  Steffanie Dunn [phone]) on 08/03/2023 10:09:17 PM   CV: NM PET CT Myocardial Sarcoidosis Study 05/19/23:   FDG uptake findings are consistent with active myocardial inflammation/sarcoidosis.   FDG uptake was observed. FDG uptake was focal. FDG uptake was present in the apical to basal lateral, apical anterior and apex segment(s). LV perfusion is abnormal. Defect 1: There is a small defect with mild reduction in uptake present in the apical inferior location(s).   Left ventricular function is abnormal. Global function is moderately reduced. EF: 36%.   Coronary calcium was present on the attenuation correction CT images. Moderate coronary calcifications were present. Coronary calcifications were present in the left anterior descending artery, left circumflex artery and right coronary artery distribution(s).   MRI Cardiac 04/12/23: IMPRESSION: 1. Mild decrease in LVEF, LVEF 46%, in the setting of free breathing artifact. 2. There is severe hypertrophy of the LV septum. Though this meets criteria for hypertrophic cardiomyopathy, this is multi-focal late gadolinium enhancement with increase in ECV signal; this is not present in the areas of hypertrophy. Consider PET imaging for inflammatory cardiomyopathy evaluation.   Echo 02/12/23: IMPRESSIONS   1. Left ventricular ejection fraction, by estimation, is 60 to 65%. The  left ventricle has normal function. The left ventricle has no regional  wall motion abnormalities. There is severe concentric left ventricular  hypertrophy. Left ventricular diastolic   parameters are indeterminate.   2. Right ventricular systolic function is normal. The right ventricular  size is normal.   3. The mitral valve is normal  in structure. Trivial mitral valve  regurgitation. No evidence of mitral stenosis.   4. The aortic valve is tricuspid. Aortic valve regurgitation is mild.  Aortic valve sclerosis is present, with no evidence of aortic valve  stenosis.   5.  The inferior vena cava is normal in size with greater than 50%  respiratory variability, suggesting right atrial pressure of 3 mmHg.    Cardiac cath 03/25/18 ([facility]): INDICATION  Abnormal troponin, abnormal nuclear stress.  Cardiac Arteries and Lesion Findings  LMCA:  Normal appearance with 0% stenosis.  LAD:  Proximal 20%. Mid 20% diffuse.  Diagonal: 20%  LCx: Proximal 20%  RCA: Proximal 20%   1. Mild, nonobstructive CAD.  2. Normal LV systolic function, EF 65%  3. Normal LV regional wall motion.  Diagnostic Procedure Recommendations  The abnormal troponin was likely secondary to the severely elevated blood  pressure the patient had at the time.  The nuclear stress test result is a false positive, likely due to  diaphragmatic attenuation.  A healthy lifestyle was recommended.    Past Medical History:  Diagnosis Date   Alcohol abuse    Arthritis    Atrial flutter (HCC)    Cardiac sarcoidosis    Chronic pain    Diabetes mellitus without complication (HCC)    DVT (deep venous thrombosis) (HCC) 05/26/2022   LLE   Dysrhythmia    2nd degree AV Block, Type 2 s/p St. 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/22   Hypercoagulable state (HCC)    elevated factor VIII, persistent + lupus anticoagulant   Hyperlipidemia    Hypertension    Obesity    PE (pulmonary thromboembolism) (HCC) 05/25/2022   Peripheral polyneuropathy    Tobacco abuse    Venous insufficiency     Past Surgical History:  Procedure Laterality Date   CARDIAC CATHETERIZATION  03/25/2018   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, mLAD, DIAG, pCX, pRCA)   CARPAL TUNNEL RELEASE Left    PACEMAKER IMPLANT N/A 08/17/2022   Procedure: PACEMAKER IMPLANT;  Surgeon: Marinus Maw, MD;  [facility]: [facility];  Service: Cardiovascular;  Laterality: N/A;    MEDICATIONS: No current facility-administered medications for this encounter.    albuterol (VENTOLIN HFA) 108 (90 Base) MCG/ACT inhaler   atorvastatin (LIPITOR) 40 MG tablet   carvedilol  (COREG) 3.125 MG tablet   cetirizine (ZYRTEC) 10 MG tablet   DULoxetine (CYMBALTA) 30 MG capsule   empagliflozin (JARDIANCE) 10 MG TABS tablet   hydrALAZINE (APRESOLINE) 25 MG tablet   icosapent Ethyl (VASCEPA) 1 g capsule   metFORMIN (GLUCOPHAGE-XR) 750 MG 24 hr tablet   oxyCODONE-acetaminophen (PERCOCET) 10-325 MG tablet   potassium chloride SA (KLOR-CON M) 20 MEQ tablet   pregabalin (LYRICA) 300 MG capsule   rivaroxaban (XARELTO) 20 MG TABS tablet   sacubitril-valsartan (ENTRESTO) 24-26 MG   sildenafil (VIAGRA) 100 MG tablet   spironolactone (ALDACTONE) 25 MG tablet   testosterone cypionate (DEPOTESTOSTERONE CYPIONATE) 200 MG/ML injection   torsemide (DEMADEX) 20 MG tablet    Shonna Chock, PA-C Surgical Short Stay/Anesthesiology [facility] Phone [phone] [facility] Phone [phone] 08/13/2023 11:34 AM

## 2023-08-13 NOTE — Anesthesia Preprocedure Evaluation (Addendum)
 Anesthesia Evaluation  Patient identified by MRN, date of birth, ID band Patient awake    Reviewed: Allergy & Precautions, H&P , NPO status , Patient's Chart, lab work & pertinent test results  Airway Mallampati: II   Neck ROM: full    Dental   Pulmonary Current Smoker and Patient abstained from smoking.   breath sounds clear to auscultation       Cardiovascular hypertension, + DVT  + dysrhythmias Atrial Fibrillation + pacemaker  Rhythm:regular Rate:Normal     Neuro/Psych    GI/Hepatic ,,,(+)     substance abuse  alcohol use  Endo/Other  diabetes, Type 2    Renal/GU Renal InsufficiencyRenal disease     Musculoskeletal  (+) Arthritis ,    Abdominal   Peds  Hematology   Anesthesia Other Findings   Reproductive/Obstetrics                             Anesthesia Physical Anesthesia Plan  ASA: 3  Anesthesia Plan: General   Post-op Pain Management:    Induction: Intravenous  PONV Risk Score and Plan: 1 and Ondansetron, Dexamethasone, Midazolam and Treatment may vary due to age or medical condition  Airway Management Planned: Oral ETT  Additional Equipment:   Intra-op Plan:   Post-operative Plan: Extubation in OR  Informed Consent: I have reviewed the patients History and Physical, chart, labs and discussed the procedure including the risks, benefits and alternatives for the proposed anesthesia with the patient or authorized representative who has indicated his/her understanding and acceptance.     Dental advisory given  Plan Discussed with: CRNA, Anesthesiologist and Surgeon  Anesthesia Plan Comments: (PAT note written 08/13/2023 by Shonna Chock, PA-C.   NM PET CT Myocardial Sarcoidosis Study 05/19/23:   FDG uptake findings are consistent with active myocardial inflammation/sarcoidosis.   FDG uptake was observed. FDG uptake was focal. FDG uptake was present in the apical  to basal lateral, apical anterior and apex segment(s). LV perfusion is abnormal. Defect 1: There is a small defect with mild reduction in uptake present in the apical inferior location(s).   Left ventricular function is abnormal. Global function is moderately reduced. EF: 36%.   Coronary calcium was present on the attenuation correction CT images. Moderate coronary calcifications were present. Coronary calcifications were present in the left anterior descending artery, left circumflex artery and right coronary artery distribution(s).     MRI Cardiac 04/12/23: IMPRESSION: 1. Mild decrease in LVEF, LVEF 46%, in the setting of free breathing artifact. 2. There is severe hypertrophy of the LV septum. Though this meets criteria for hypertrophic cardiomyopathy, this is multi-focal late gadolinium enhancement with increase in ECV signal; this is not present in the areas of hypertrophy. Consider PET imaging for inflammatory cardiomyopathy evaluation.     Echo 02/12/23: IMPRESSIONS   1. Left ventricular ejection fraction, by estimation, is 60 to 65%. The  left ventricle has normal function. The left ventricle has no regional  wall motion abnormalities. There is severe concentric left ventricular  hypertrophy. Left ventricular diastolic   parameters are indeterminate.   2. Right ventricular systolic function is normal. The right ventricular  size is normal.   3. The mitral valve is normal in structure. Trivial mitral valve  regurgitation. No evidence of mitral stenosis.   4. The aortic valve is tricuspid. Aortic valve regurgitation is mild.  Aortic valve sclerosis is present, with no evidence of aortic valve  stenosis.   5.  The inferior vena cava is normal in size with greater than 50%  respiratory variability, suggesting right atrial pressure of 3 mmHg.      Cardiac cath 03/25/18 ([facility]): 1. Mild, nonobstructive CAD.  2. Normal LV systolic function, EF 65%  3. Normal LV regional wall  motion.  )       Anesthesia Quick Evaluation

## 2023-08-14 DIAGNOSIS — Z419 Encounter for procedure for purposes other than remedying health state, unspecified: Secondary | ICD-10-CM | POA: Diagnosis not present

## 2023-08-16 ENCOUNTER — Encounter (HOSPITAL_COMMUNITY)
Admission: RE | Disposition: A | Discharge status: Home / Self Care | Payer: Self-pay | Source: Home / Self Care | Attending: Emergency Medicine

## 2023-08-16 ENCOUNTER — Other Ambulatory Visit: Payer: Self-pay

## 2023-08-16 ENCOUNTER — Encounter (HOSPITAL_COMMUNITY): Payer: Self-pay | Admitting: Emergency Medicine

## 2023-08-16 ENCOUNTER — Ambulatory Visit (HOSPITAL_COMMUNITY): Payer: Self-pay | Admitting: Vascular Surgery

## 2023-08-16 ENCOUNTER — Ambulatory Visit (HOSPITAL_COMMUNITY)
Admission: RE | Admit: 2023-08-16 | Discharge: 2023-08-16 | Disposition: A | Discharge status: Home / Self Care | Payer: BC Managed Care – PPO | Attending: Emergency Medicine | Admitting: Emergency Medicine

## 2023-08-16 DIAGNOSIS — I428 Other cardiomyopathies: Secondary | ICD-10-CM | POA: Diagnosis not present

## 2023-08-16 DIAGNOSIS — Z6841 Body Mass Index (BMI) 40.0 and over, adult: Secondary | ICD-10-CM | POA: Insufficient documentation

## 2023-08-16 DIAGNOSIS — I441 Atrioventricular block, second degree: Secondary | ICD-10-CM | POA: Diagnosis not present

## 2023-08-16 DIAGNOSIS — Z86711 Personal history of pulmonary embolism: Secondary | ICD-10-CM | POA: Diagnosis not present

## 2023-08-16 DIAGNOSIS — F17210 Nicotine dependence, cigarettes, uncomplicated: Secondary | ICD-10-CM | POA: Insufficient documentation

## 2023-08-16 DIAGNOSIS — Z86718 Personal history of other venous thrombosis and embolism: Secondary | ICD-10-CM | POA: Insufficient documentation

## 2023-08-16 DIAGNOSIS — G8929 Other chronic pain: Secondary | ICD-10-CM | POA: Insufficient documentation

## 2023-08-16 DIAGNOSIS — Z7984 Long term (current) use of oral hypoglycemic drugs: Secondary | ICD-10-CM | POA: Insufficient documentation

## 2023-08-16 DIAGNOSIS — I4891 Unspecified atrial fibrillation: Secondary | ICD-10-CM | POA: Insufficient documentation

## 2023-08-16 DIAGNOSIS — I255 Ischemic cardiomyopathy: Secondary | ICD-10-CM | POA: Diagnosis not present

## 2023-08-16 DIAGNOSIS — E669 Obesity, unspecified: Secondary | ICD-10-CM | POA: Diagnosis not present

## 2023-08-16 DIAGNOSIS — Z950 Presence of cardiac pacemaker: Secondary | ICD-10-CM | POA: Diagnosis not present

## 2023-08-16 DIAGNOSIS — I872 Venous insufficiency (chronic) (peripheral): Secondary | ICD-10-CM | POA: Insufficient documentation

## 2023-08-16 DIAGNOSIS — Z7901 Long term (current) use of anticoagulants: Secondary | ICD-10-CM | POA: Diagnosis not present

## 2023-08-16 DIAGNOSIS — I10 Essential (primary) hypertension: Secondary | ICD-10-CM | POA: Insufficient documentation

## 2023-08-16 DIAGNOSIS — R590 Localized enlarged lymph nodes: Secondary | ICD-10-CM | POA: Diagnosis not present

## 2023-08-16 DIAGNOSIS — I119 Hypertensive heart disease without heart failure: Secondary | ICD-10-CM | POA: Insufficient documentation

## 2023-08-16 DIAGNOSIS — D6862 Lupus anticoagulant syndrome: Secondary | ICD-10-CM | POA: Insufficient documentation

## 2023-08-16 DIAGNOSIS — I2510 Atherosclerotic heart disease of native coronary artery without angina pectoris: Secondary | ICD-10-CM | POA: Diagnosis not present

## 2023-08-16 DIAGNOSIS — E119 Type 2 diabetes mellitus without complications: Secondary | ICD-10-CM | POA: Insufficient documentation

## 2023-08-16 HISTORY — PX: FINE NEEDLE ASPIRATION: SHX5430

## 2023-08-16 HISTORY — DX: Presence of cardiac pacemaker: Z95.0

## 2023-08-16 HISTORY — PX: VIDEO BRONCHOSCOPY WITH ENDOBRONCHIAL ULTRASOUND: SHX6177

## 2023-08-16 HISTORY — PX: BRONCHIAL WASHINGS: SHX5105

## 2023-08-16 HISTORY — DX: Cardiac arrhythmia, unspecified: I49.9

## 2023-08-16 HISTORY — DX: Other primary thrombophilia: D68.59

## 2023-08-16 LAB — CBC
HCT: 42.4 % (ref 39.0–52.0)
Hemoglobin: 14.4 g/dL (ref 13.0–17.0)
MCH: 32.7 pg (ref 26.0–34.0)
MCHC: 34 g/dL (ref 30.0–36.0)
MCV: 96.4 fL (ref 80.0–100.0)
Platelets: 192 10*3/uL (ref 150–400)
RBC: 4.4 MIL/uL (ref 4.22–5.81)
RDW: 13.9 % (ref 11.5–15.5)
WBC: 6.3 10*3/uL (ref 4.0–10.5)
nRBC: 0 % (ref 0.0–0.2)

## 2023-08-16 LAB — GLUCOSE, CAPILLARY
Glucose-Capillary: 182 mg/dL — ABNORMAL HIGH (ref 70–99)
Glucose-Capillary: 125 mg/dL — ABNORMAL HIGH (ref 70–99)

## 2023-08-16 SURGERY — BRONCHOSCOPY, WITH EBUS
Anesthesia: General

## 2023-08-16 MED ORDER — ROCURONIUM BROMIDE 10 MG/ML (PF) SYRINGE
PREFILLED_SYRINGE | INTRAVENOUS | Status: DC | PRN
  Administered 2023-08-16: 60 mg via INTRAVENOUS

## 2023-08-16 MED ORDER — RIVAROXABAN 20 MG PO TABS: 20.0000 mg | ORAL_TABLET | Freq: Every day | ORAL | Status: DC

## 2023-08-16 MED ORDER — PROPOFOL 10 MG/ML IV BOLUS: INTRAVENOUS | Status: DC | PRN

## 2023-08-16 MED ORDER — LIDOCAINE 2% (20 MG/ML) 5 ML SYRINGE
INTRAMUSCULAR | Status: DC | PRN
Start: 2023-08-16 — End: 2023-08-16
  Administered 2023-08-16: 100 mg via INTRAVENOUS

## 2023-08-16 MED ORDER — PROPOFOL 500 MG/50ML IV EMUL
INTRAVENOUS | Status: DC | PRN
  Administered 2023-08-16: 100 ug/kg/min via INTRAVENOUS

## 2023-08-16 MED ORDER — MIDAZOLAM HCL 2 MG/2ML IJ SOLN
INTRAMUSCULAR | Status: DC | PRN
  Administered 2023-08-16: 2 mg via INTRAVENOUS

## 2023-08-16 MED ORDER — ONDANSETRON HCL 4 MG/2ML IJ SOLN
INTRAMUSCULAR | Status: DC | PRN
Start: 2023-08-16 — End: 2023-08-16
  Administered 2023-08-16: 4 mg via INTRAVENOUS

## 2023-08-16 MED ORDER — DEXMEDETOMIDINE HCL IN NACL 80 MCG/20ML IV SOLN
INTRAVENOUS | Status: AC
  Filled 2023-08-16: qty 20

## 2023-08-16 MED ORDER — PROPOFOL 10 MG/ML IV BOLUS
INTRAVENOUS | Status: DC | PRN
  Administered 2023-08-16: 100 mg via INTRAVENOUS

## 2023-08-16 MED ORDER — SUGAMMADEX SODIUM 200 MG/2ML IV SOLN
INTRAVENOUS | Status: DC | PRN
Start: 2023-08-16 — End: 2023-08-16
  Administered 2023-08-16: 272.2 mg via INTRAVENOUS

## 2023-08-16 MED ORDER — DEXMEDETOMIDINE HCL IN NACL 80 MCG/20ML IV SOLN
INTRAVENOUS | Status: DC | PRN
  Administered 2023-08-16: 20 ug via INTRAVENOUS

## 2023-08-16 MED ORDER — LACTATED RINGERS IV SOLN: INTRAVENOUS | Status: DC

## 2023-08-16 MED ORDER — INSULIN ASPART 100 UNIT/ML IJ SOLN
INTRAMUSCULAR | Status: AC
  Administered 2023-08-16: 4 [IU] via SUBCUTANEOUS
  Filled 2023-08-16: qty 1

## 2023-08-16 MED ORDER — LACTATED RINGERS IV SOLN: INTRAVENOUS | Status: DC | PRN

## 2023-08-16 MED ORDER — MIDAZOLAM HCL 2 MG/2ML IJ SOLN
INTRAMUSCULAR | Status: AC
Start: 2023-08-16 — End: 2023-08-16
  Filled 2023-08-16: qty 2

## 2023-08-16 MED ORDER — PHENYLEPHRINE HCL-NACL 20-0.9 MG/250ML-% IV SOLN
INTRAVENOUS | Status: DC | PRN
  Administered 2023-08-16: 50 ug/min via INTRAVENOUS

## 2023-08-16 MED ORDER — DULOXETINE HCL 30 MG PO CPEP: 30.0000 mg | ORAL_CAPSULE | ORAL | Status: DC | PRN

## 2023-08-16 MED ORDER — CHLORHEXIDINE GLUCONATE 0.12 % MT SOLN
15.0000 mL | Freq: Once | OROMUCOSAL | Status: AC
  Administered 2023-08-16: 15 mL via OROMUCOSAL

## 2023-08-16 MED ORDER — CHLORHEXIDINE GLUCONATE 0.12 % MT SOLN
OROMUCOSAL | Status: AC
  Filled 2023-08-16: qty 15

## 2023-08-16 MED ORDER — OXYCODONE-ACETAMINOPHEN 10-325 MG PO TABS: 1.0000 | ORAL_TABLET | Freq: Every day | ORAL | Status: DC | PRN

## 2023-08-16 MED ORDER — DEXAMETHASONE SODIUM PHOSPHATE 10 MG/ML IJ SOLN
INTRAMUSCULAR | Status: DC | PRN
  Administered 2023-08-16: 10 mg via INTRAVENOUS

## 2023-08-16 MED ORDER — TORSEMIDE 20 MG PO TABS: 20.0000 mg | ORAL_TABLET | ORAL | Status: DC

## 2023-08-16 MED ORDER — INSULIN ASPART 100 UNIT/ML IJ SOLN: 0.0000 [IU] | INTRAMUSCULAR | Status: DC | PRN

## 2023-08-16 NOTE — Transfer of Care (Signed)
 Immediate Anesthesia Transfer of Care Note  Patient: Christopher Green  Procedure(s) Performed: VIDEO BRONCHOSCOPY WITH ENDOBRONCHIAL ULTRASOUND BRONCHIAL WASHINGS FINE NEEDLE ASPIRATION (FNA) LINEAR  Patient Location: PACU  Anesthesia Type:General  Level of Consciousness: drowsy, patient cooperative, and responds to stimulation  Airway & Oxygen Therapy: Patient Spontanous Breathing and Patient connected to face mask oxygen  Post-op Assessment: Report given to RN, Post -op Vital signs reviewed and stable, Patient moving all extremities X 4, and Patient able to stick tongue midline  Post vital signs: Reviewed and stable  Last Vitals:  Vitals Value Taken Time  BP 157/60   Temp 98.6   Pulse 60 08/16/23 1313  Resp 15   SpO2 96 % 08/16/23 1313  Vitals shown include unfiled device data.  Last Pain:  Vitals:   08/16/23 1026  TempSrc:   PainSc: 0-No pain      Patients Stated Pain Goal: 0 (08/16/23 1026)  Complications: No notable events documented.

## 2023-08-16 NOTE — Anesthesia Postprocedure Evaluation (Signed)
 Anesthesia Post Note  Patient: Christopher Green  Procedure(s) Performed: VIDEO BRONCHOSCOPY WITH ENDOBRONCHIAL ULTRASOUND BRONCHIAL WASHINGS FINE NEEDLE ASPIRATION (FNA) LINEAR     Patient location during evaluation: PACU Anesthesia Type: General Level of consciousness: awake and alert Pain management: pain level controlled Vital Signs Assessment: post-procedure vital signs reviewed and stable Respiratory status: spontaneous breathing, nonlabored ventilation, respiratory function stable and patient connected to nasal cannula oxygen Cardiovascular status: blood pressure returned to baseline and stable Postop Assessment: no apparent nausea or vomiting Anesthetic complications: no   No notable events documented.  Last Vitals:  Vitals:   08/16/23 1330 08/16/23 1337  BP: (!) 127/58   Pulse: 61 74  Resp: 20 14  Temp:  36.7 C  SpO2: 91% 95%    Last Pain:  Vitals:   08/16/23 1330  TempSrc:   PainSc: Asleep                 Clarke Peretz S

## 2023-08-16 NOTE — Op Note (Signed)
 Video Bronchoscopy with Endobronchial Ultrasound Procedure Note  Date of Operation: 08/16/2023  Pre-op Diagnosis: Mediastinal adenopathy  Post-op Diagnosis: Same  Surgeon: Levy Pupa  Assistants: None  Anesthesia: General endotracheal anesthesia  Operation: Flexible video fiberoptic bronchoscopy with endobronchial ultrasound and biopsies.  Estimated Blood Loss: Minimal  Complications: No apparent  Indications and History: Christopher Green is a 53 y.o. male with history of ischemic cardiomyopathy, tobacco abuse.  Noted to have mediastinal adenopathy on CT scan of the chest.  Some concern for possible sarcoidosis based on his cardiac evaluation.  Mediastinal nodal biopsy is recommended via endobronchial ultrasound.  The risks, benefits, complications, treatment options and expected outcomes were discussed with the patient.  The possibilities of pneumothorax, pneumonia, reaction to medication, pulmonary aspiration, perforation of a viscus, bleeding, failure to diagnose a condition and creating a complication requiring transfusion or operation were discussed with the patient who freely signed the consent.    Description of Procedure: The patient was examined in the preoperative area and history and data from the preprocedure consultation were reviewed. It was deemed appropriate to proceed.  The patient was taken to [facility] endoscopy room 3, identified as [name] and the procedure verified as Flexible Video Fiberoptic Bronchoscopy.  A Time Out was held and the above information confirmed. After being taken to the operating room general anesthesia was initiated and the patient  was orally intubated. The video fiberoptic bronchoscope was introduced via the endotracheal tube and a general inspection was performed which showed normal airways throughout.  There were no abnormal secretions or endobronchial lesions seen.  A bronchoalveolar lavage with 90 cc of normal saline instilled and  approximately 25 cc returned was performed in the anterior segment of the right upper lobe.  This was sent for microbiology.  The standard scope was then withdrawn and the endobronchial ultrasound was used to identify and characterize the peritracheal, hilar and bronchial lymph nodes. Inspection showed slight enlargement at station 4R, 7, 10 R. Using real-time ultrasound guidance Wang needle biopsies were take from Station 4R and 7 nodes and were sent for cytology. The patient tolerated the procedure well without apparent complications. There was no significant blood loss. The bronchoscope was withdrawn. Anesthesia was reversed and the patient was taken to the PACU for recovery.   Samples: 1. Wang needle biopsies from 4R node 2. Wang needle biopsies from 7 node 3.  Bronchoalveolar lavage from the right upper lobe for microbiology   Plans:  The patient will be discharged from the PACU to home when recovered from anesthesia. We will review the cytology, pathology and microbiology results with the patient when they become available. Outpatient followup will be with Dr. Delton Coombes.    Levy Pupa, MD, PhD 08/16/2023, 1:02 PM [facility] [phone] or if no answer before 7:00PM call [phone] For any issues after 7:00PM please call eLink [phone]

## 2023-08-16 NOTE — Discharge Instructions (Addendum)
 Flexible Bronchoscopy, Care After This sheet gives you information about how to care for yourself after your test. Your doctor may also give you more specific instructions. If you have problems or questions, contact your doctor. Follow these instructions at home: Eating and drinking When your numbness is gone and your cough and gag reflexes have come back, you may: Eat only soft foods. Slowly drink liquids. When you get home after the test, go back to your normal diet. Driving Do not drive for 24 hours if you were given a medicine to help you relax (sedative). Do not drive or use heavy machinery while taking prescription pain medicine. General instructions  Take over-the-counter and prescription medicines only as told by your doctor. Return to your normal activities as told. Ask what activities are safe for you. Do not use any products that have nicotine or tobacco in them. This includes cigarettes and e-cigarettes. If you need help quitting, ask your doctor. Keep all follow-up visits as told by your doctor. This is important. It is very important if you had a tissue sample (biopsy) taken. Get help right away if: You have shortness of breath that gets worse. You get light-headed. You feel like you are going to pass out (faint). You have chest pain. You cough up: More than a little blood. More blood than before. Summary Do not eat or drink anything (not even water) for 2 hours after your test, or until your numbing medicine wears off. Do not use cigarettes. Do not use e-cigarettes. Get help right away if you have chest pain.  Please call our office for any questions or concerns.  [phone]. Okay to restart your Xarelto on 3//25  This information is not intended to replace advice given to you by your health care provider. Make sure you discuss any questions you have with your health care provider. Document Released: 03/29/2009 Document Revised: 05/14/2017 Document Reviewed:  06/19/2016 Elsevier Patient Education  2020 [employer].

## 2023-08-16 NOTE — H&P (Signed)
 Christopher Green is an 52 y.o. male.   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  His evaluation for his cardiomyopathy has included chest imaging, most recently high-resolution CT scan of the chest 05/31/2023 that showed mediastinal and hilar adenopathy suspicious for possible sarcoidosis.  This would potentially also explain his cardiac findings as cardiac PET scan 12//24 was consistent with active myocardial inflammation from possible sarcoid. He denies any significant new issue.  Has been off his Xarelto for 2 days.  Understands the risks, benefits and rationale of the procedure and agrees to proceed.  Past Medical History:  Diagnosis Date   Alcohol abuse    Arthritis    Atrial flutter (HCC)    Cardiac sarcoidosis    Chronic pain    Diabetes mellitus without complication (HCC)    DVT (deep venous thrombosis) (HCC) 05/26/2022   LLE   Dysrhythmia    2nd degree AV Block, Type 2 s/p St. 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/22   Hypercoagulable state (HCC)    elevated factor VIII, persistent + lupus anticoagulant   Hyperlipidemia    Hypertension    Obesity    PE (pulmonary thromboembolism) (HCC) 05/25/2022   Peripheral polyneuropathy    Presence of permanent cardiac pacemaker    Tobacco abuse    Venous insufficiency     Past Surgical History:  Procedure Laterality Date   CARDIAC CATHETERIZATION  03/25/2018   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, mLAD, DIAG, pCX, pRCA)   CARPAL TUNNEL RELEASE Left    PACEMAKER IMPLANT N/A 08/17/2022   Procedure: PACEMAKER IMPLANT;  Surgeon: Marinus Maw, MD;  Location: [facility];  Service: Cardiovascular;  Laterality: N/A;    Family History  Problem Relation Age of Onset   Heart attack Father     Social History:  reports that he has been smoking cigarettes. He has been exposed to tobacco smoke. He has never used smokeless tobacco. He reports current alcohol use. He reports that he does not use drugs.  Allergies: No Known Allergies  Medications Prior to Admission  Medication Sig Dispense Refill   albuterol (VENTOLIN HFA) 108 (90 Base) MCG/ACT inhaler Inhale 2 puffs into the lungs every 6 (six) hours as needed for wheezing or shortness of breath. 8 g 0   atorvastatin (LIPITOR) 40 MG tablet Take 1 tablet (40 mg total) by mouth daily. 90 tablet 3   carvedilol (COREG) 3.125 MG tablet TAKE 1 TABLET BY MOUTH 2 TIMES DAILY WITH A MEAL. 60 tablet 4   cetirizine (ZYRTEC) 10 MG tablet Take 1 tablet (10 mg total) by mouth daily. 30 tablet 11   DULoxetine (CYMBALTA) 30 MG capsule Take 1 capsule (30 mg total) by mouth 2 (two) times daily. (Patient taking differently: Take 30 mg by mouth 2 (two) times daily. As needed) 180 capsule 1   empagliflozin (JARDIANCE) 10 MG TABS tablet Take 1 tablet (10 mg total) by mouth daily before breakfast. 30 tablet 6   hydrALAZINE (APRESOLINE) 25 MG tablet Take 25 mg by mouth 3 (three) times daily.     icosapent Ethyl (VASCEPA) 1 g capsule Take 2 capsules (2 g total) by mouth 2 (two) times daily. 120 capsule 6   metFORMIN (GLUCOPHAGE-XR) 750 MG 24 hr tablet Take 2 tablets (1,500 mg total) by mouth daily. 180 tablet 1   oxyCODONE-acetaminophen (  PERCOCET) 10-325 MG tablet Take one tablet every 4 to 6 hours as needed for pain 150 tablet 0   potassium chloride SA (KLOR-CON M) 20 MEQ tablet Take 1 tablet (20 mEq total) by mouth 2 (two) times daily. 33 tablet 6   pregabalin (LYRICA) 300 MG capsule Take 300 mg by mouth 2 (two) times daily.     rivaroxaban (XARELTO) 20 MG TABS tablet Take 1 tablet (20 mg total) by mouth daily with supper. 90 tablet 3   sacubitril-valsartan (ENTRESTO) 24-26 MG Take 1 tablet by mouth 2 (two) times daily. 60 tablet 6   sildenafil (VIAGRA) 100  MG tablet Take 0.5-1 tablets (50-100 mg total) by mouth daily as needed for erectile dysfunction. 10 tablet 11   spironolactone (ALDACTONE) 25 MG tablet Take 0.5 tablets (12.5 mg total) by mouth daily.     testosterone cypionate (DEPOTESTOSTERONE CYPIONATE) 200 MG/ML injection Inject 1 mL (200 mg total) into the muscle every 14 (fourteen) days. 10 mL 1   torsemide (DEMADEX) 20 MG tablet Take 1 tablet (20 mg total) by mouth daily. 33 tablet 6    Results for orders placed or performed during the hospital encounter of 08/16/23 (from the past 48 hours)  Glucose, capillary     Status: Abnormal   Collection Time: 08/16/23 10:15 AM  Result Value Ref Range   Glucose-Capillary 182 (H) 70 - 99 mg/dL    Comment: Glucose reference range applies only to samples taken after fasting for at least 8 hours.   Comment 1 Notify RN    No results found.  Review of Systems  Blood pressure (!) 186/82, pulse 65, temperature 98.3 F (36.8 C), temperature source Oral, resp. rate 16, height 6\' 1"  (1.854 m), weight 136.1 kg, SpO2 95%. Physical Exam  Gen: Pleasant, obese man in no distress,  normal affect although somewhat stoic  ENT: No lesions,  mouth clear,  oropharynx clear, no postnasal drip  Neck: No JVD, no stridor  Lungs: No use of accessory muscles, distant without crackles or wheezing  Cardiovascular: RRR, heart sounds normal, no murmur or gallops, no peripheral edema  Abdomen: Obese, protuberant soft and NT, no HSM,  BS normal  Musculoskeletal: No deformities, no cyanosis or clubbing  Neuro: alert, awake, non focal  Skin: Warm, no lesions or rashes    Assessment/Plan Mediastinal adenopathy and a patient with cardiomyopathy and active myocardial inflammation on PET scan 05/19/23 suspicious for sarcoidosis.  Plan for endobronchial ultrasound and nodal biopsies today.  Patient understands the risks, benefits and agrees to proceed.  No barriers identified.  He has been off his Xarelto for 2  days.   Leslye Peer, MD 08/16/2023, 10:38 AM

## 2023-08-16 NOTE — Anesthesia Procedure Notes (Signed)
 Procedure Name: Intubation Date/Time: 08/16/2023 12:13 PM  Performed by: Cy Blamer, CRNAPre-anesthesia Checklist: Patient identified, Emergency Drugs available, Suction available and Patient being monitored Patient Re-evaluated:Patient Re-evaluated prior to induction Oxygen Delivery Method: Circle system utilized Preoxygenation: Pre-oxygenation with 100% oxygen Induction Type: IV induction Ventilation: Mask ventilation without difficulty and Oral airway inserted - appropriate to patient size Laryngoscope Size: Miller and 3 Grade View: Grade II Tube type: Oral Tube size: 8.5 mm Number of attempts: 1 Airway Equipment and Method: Stylet, Oral airway and Bite block Placement Confirmation: ETT inserted through vocal cords under direct vision, positive ETCO2 and breath sounds checked- equal and bilateral Secured at: 22 cm Tube secured with: Tape Dental Injury: Teeth and Oropharynx as per pre-operative assessment

## 2023-08-17 ENCOUNTER — Ambulatory Visit (INDEPENDENT_AMBULATORY_CARE_PROVIDER_SITE_OTHER): Payer: BC Managed Care – PPO

## 2023-08-17 DIAGNOSIS — I441 Atrioventricular block, second degree: Secondary | ICD-10-CM | POA: Diagnosis not present

## 2023-08-17 LAB — CYTOLOGY - NON PAP

## 2023-08-18 ENCOUNTER — Encounter (HOSPITAL_COMMUNITY): Payer: Self-pay | Admitting: Emergency Medicine

## 2023-08-18 ENCOUNTER — Telehealth: Payer: Self-pay

## 2023-08-18 LAB — ACID FAST SMEAR (AFB, MYCOBACTERIA): Acid Fast Smear: NEGATIVE

## 2023-08-18 NOTE — Telephone Encounter (Signed)
 Called pt to let him know that his paper work was ready for pick up.   Pt knows there is a 29$ fee due at pick up

## 2023-08-19 ENCOUNTER — Encounter: Payer: Self-pay | Admitting: Internal Medicine

## 2023-08-19 LAB — CUP PACEART REMOTE DEVICE CHECK
Battery Remaining Longevity: 109 mo
Battery Remaining Percentage: 91 %
Battery Voltage: 3.01 V
Brady Statistic RV Percent Paced: 64 %
Date Time Interrogation Session: 20250303020013
Implantable Lead Connection Status: 753985
Implantable Lead Connection Status: 753985
Implantable Lead Implant Date: 20240304
Implantable Lead Implant Date: 20240304
Implantable Lead Location: 753859
Implantable Lead Location: 753860
Implantable Pulse Generator Implant Date: 20240304
Lead Channel Impedance Value: 430 Ohm
Lead Channel Pacing Threshold Amplitude: 1 V
Lead Channel Pacing Threshold Pulse Width: 0.5 ms
Lead Channel Sensing Intrinsic Amplitude: 12 mV
Lead Channel Setting Pacing Amplitude: 2.5 V
Lead Channel Setting Pacing Pulse Width: 0.5 ms
Lead Channel Setting Sensing Sensitivity: 2 mV
Pulse Gen Model: 2272
Pulse Gen Serial Number: 8153879

## 2023-08-19 LAB — CULTURE, BAL-QUANTITATIVE W GRAM STAIN: Gram Stain: NONE SEEN

## 2023-08-20 ENCOUNTER — Telehealth (HOSPITAL_COMMUNITY): Payer: Self-pay | Admitting: Cardiology

## 2023-08-20 NOTE — Telephone Encounter (Signed)
 Pt came to pick up packet of paperwork and Disability parking placard.   Pt mention a hand written note from provider..I advised the pt that the note wasn't ready at this time but would call when it is ready.  Pt wanted to let Dr. Constance Goltz know that he is going to meet with another attorney on Tuesday and was hoping to provide the note as that time.   29$ fee has been collected

## 2023-08-20 NOTE — Telephone Encounter (Signed)
 Patient walked in and came to front office stating that he needs a letter from Dr. Gasper Lloyd in regards to his disability. Patient states he has been out of work for over 2 years.   Patient needs a letter stating that he is disabled from Dr. Gasper Lloyd / Richarda Blade, NP.   Patient would like for Dr. Nyoka Cowden nurse to give him a call [phone].   Please advise.   Thank you!

## 2023-08-21 ENCOUNTER — Telehealth: Payer: Self-pay | Admitting: Family Medicine

## 2023-08-21 NOTE — Telephone Encounter (Signed)
 Please call the patient and let him know he can pick up a copy of his letter for his disability case.  I have also sent it to him via MyChart.  If he reviews the letter and feels any of the information is inaccurate or if there is anything he would like me to remove please let me know and rewrite it.

## 2023-08-22 LAB — ANAEROBIC CULTURE W GRAM STAIN

## 2023-08-23 NOTE — Telephone Encounter (Signed)
 The letter has been written.  He can come into get a paper copy if he would like but it was also sent to him via MyChart.

## 2023-08-24 ENCOUNTER — Encounter (HOSPITAL_COMMUNITY): Payer: Self-pay | Admitting: Cardiology

## 2023-08-26 ENCOUNTER — Ambulatory Visit: Payer: BC Managed Care – PPO | Admitting: Emergency Medicine

## 2023-08-26 ENCOUNTER — Encounter: Payer: Self-pay | Admitting: Emergency Medicine

## 2023-08-26 VITALS — BP 136/84 | HR 68 | Ht 73.0 in | Wt 301.8 lb

## 2023-08-26 DIAGNOSIS — Z720 Tobacco use: Secondary | ICD-10-CM

## 2023-08-26 DIAGNOSIS — R590 Localized enlarged lymph nodes: Secondary | ICD-10-CM

## 2023-08-26 DIAGNOSIS — R0683 Snoring: Secondary | ICD-10-CM | POA: Diagnosis not present

## 2023-08-26 DIAGNOSIS — F17210 Nicotine dependence, cigarettes, uncomplicated: Secondary | ICD-10-CM | POA: Diagnosis not present

## 2023-08-26 NOTE — Patient Instructions (Signed)
 We reviewed your bronchoscopy results today.  There is no evidence of sarcoidosis on your lymph node biopsies.  There is no evidence for cancer. We talked today about possibly performing pulmonary function testing to evaluate for COPD.  We can revisit this in the future. Get your sleep study as planned by Cardiology Try to work on decreasing your cigarettes.  Ultimate goal would be to stop altogether. Follow Dr. Delton Coombes in 1 year, sooner if you have any breathing trouble

## 2023-08-26 NOTE — Progress Notes (Signed)
 Subjective:    Patient ID: Christopher Green, male    DOB: [birth year], 53 y.o.   MRN: [id number]  HPI [name] is 24 with a history of tobacco use (40 pack years), nonischemic cardiomyopathy, hypertension, A-fib/flutter, third-degree heart block with a pacemaker, lupus anticoagulant and antiphospholipid antibody syndrome on lifelong Xarelto, diabetes.  I met him 08/16/2023 to evaluate mediastinal and hilar adenopathy.  Cardiology workup for his cardiomyopathy had some suspicion of possible cardiac sarcoid, that was suspected to have possibly correlate with the adenopathy.  A cardiac PET scan 05/2023 was suspicious for active myocardial inflammation and sarcoid.  We performed EBUS and nodal biopsies on 3/3.  He tolerated well, is back on his Xarelto.  Today he reports that he is feeling well. Breathing can be limiting when he is exerting himself. No real cough. He uses albuterol as needed, sometimes not for weeks. He is waiting to get a sleep study done.   Cytology on his EBUS samples from 08/16/2023: 4R > compatible with benign lymph node with some benign bronchial cells, no evidence of malignancy, no granulomas seen 7 > no evidence of malignancy, no granulomas, benign lymph node tissue Right upper lobe BAL > fungal culture reported as negative, but then reflex reported 1-2 cultures of Candida glabrata.  AFB smear negative with culture still pending    Review of Systems As per HPI  Past Medical History:  Diagnosis Date   Alcohol abuse    Arthritis    Atrial flutter (HCC)    Cardiac sarcoidosis    Chronic pain    Diabetes mellitus without complication (HCC)    DVT (deep venous thrombosis) (HCC) 05/26/2022   LLE   Dysrhythmia    2nd degree AV Block, Type 2 s/p St. 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/22   Hypercoagulable state (HCC)    elevated factor VIII, persistent + lupus anticoagulant   Hyperlipidemia    Hypertension    Obesity    PE (pulmonary thromboembolism) (HCC) 05/25/2022   Peripheral  polyneuropathy    Presence of permanent cardiac pacemaker    Tobacco abuse    Venous insufficiency      Family History  Problem Relation Age of Onset   Heart attack Father      Social History   Socioeconomic History   Marital status: Married    Spouse name: Not on file   Number of children: Not on file   Years of education: Not on file   Highest education level: Not on file  Occupational History   Not on file  Tobacco Use   Smoking status: Every Day    Types: Cigarettes    Passive exposure: Current   Smokeless tobacco: Never   Tobacco comments:    1ppd 08/26/23  Vaping Use   Vaping status: Never Used  Substance and Sexual Activity   Alcohol use: Yes    Comment: daily   Drug use: Never   Sexual activity: Not on file  Other Topics Concern   Not on file  Social History Narrative   Not on file   Social Drivers of Health   Financial Resource Strain: Not on file  Food Insecurity: No Food Insecurity (05/25/2022)   Hunger Vital Sign    Worried About Running Out of Food in the Last Year: Never true    Ran Out of Food in the Last Year: Never true  Transportation Needs: No Transportation Needs (05/25/2022)   PRAPARE - Administrator, Civil Service (Medical): No  Lack of Transportation (Non-Medical): No  Physical Activity: Not on file  Stress: Not on file  Social Connections: Not on file  Intimate Partner Violence: Not At Risk (05/25/2022)   Humiliation, Afraid, Rape, and Kick questionnaire    Fear of Current or Ex-Partner: No    Emotionally Abused: No    Physically Abused: No    Sexually Abused: No     No Known Allergies   Outpatient Medications Prior to Visit  Medication Sig Dispense Refill   albuterol (VENTOLIN HFA) 108 (90 Base) MCG/ACT inhaler Inhale 2 puffs into the lungs every 6 (six) hours as needed for wheezing or shortness of breath. 8 g 0   atorvastatin (LIPITOR) 40 MG tablet Take 1 tablet (40 mg total) by mouth daily. 90 tablet 3    carvedilol (COREG) 3.125 MG tablet TAKE 1 TABLET BY MOUTH 2 TIMES DAILY WITH A MEAL. 60 tablet 4   cetirizine (ZYRTEC) 10 MG tablet Take 1 tablet (10 mg total) by mouth daily. 30 tablet 11   DULoxetine (CYMBALTA) 30 MG capsule Take 1 capsule (30 mg total) by mouth as needed (nerve pain).     empagliflozin (JARDIANCE) 10 MG TABS tablet Take 1 tablet (10 mg total) by mouth daily before breakfast. 30 tablet 6   icosapent Ethyl (VASCEPA) 1 g capsule Take 2 capsules (2 g total) by mouth 2 (two) times daily. 120 capsule 6   metFORMIN (GLUCOPHAGE-XR) 750 MG 24 hr tablet Take 2 tablets (1,500 mg total) by mouth daily. (Patient taking differently: Take 750 mg by mouth in the morning and at bedtime.) 180 tablet 1   potassium chloride SA (KLOR-CON M) 20 MEQ tablet Take 1 tablet (20 mEq total) by mouth 2 (two) times daily. 33 tablet 6   pregabalin (LYRICA) 300 MG capsule Take 300 mg by mouth daily as needed (pain).     rivaroxaban (XARELTO) 20 MG TABS tablet Take 1 tablet (20 mg total) by mouth daily with supper. Okay to restart this medication on 08/17/2023     sacubitril-valsartan (ENTRESTO) 24-26 MG Take 1 tablet by mouth 2 (two) times daily. 60 tablet 6   Semaglutide, 1 MG/DOSE, (OZEMPIC, 1 MG/DOSE,) 4 MG/3ML SOPN Inject 1 mg into the skin once a week.     sildenafil (VIAGRA) 100 MG tablet Take 0.5-1 tablets (50-100 mg total) by mouth daily as needed for erectile dysfunction. 10 tablet 11   spironolactone (ALDACTONE) 25 MG tablet Take 0.5 tablets (12.5 mg total) by mouth daily.     testosterone cypionate (DEPOTESTOSTERONE CYPIONATE) 200 MG/ML injection Inject 1 mL (200 mg total) into the muscle every 14 (fourteen) days. 10 mL 1   torsemide (DEMADEX) 20 MG tablet Take 1 tablet (20 mg total) by mouth every other day.     oxyCODONE-acetaminophen (PERCOCET) 10-325 MG tablet Take 1-2 tablets by mouth daily as needed for pain.     No facility-administered medications prior to visit.        Objective:    Physical Exam  Vitals:   08/26/23 1114 08/26/23 1117  BP: (!) 148/82 136/84  Pulse: 68   SpO2: 96%   Weight: (!) 301 lb 12.8 oz (136.9 kg)   Height: 6\' 1"  (1.854 m)    Gen: Pleasant, obese man, in no distress,  normal affect  ENT: No lesions,  mouth clear,  oropharynx clear, no postnasal drip  Neck: No JVD, no stridor  Lungs: No use of accessory muscles, no crackles or wheezing on normal respiration, no wheeze on  forced expiration  Cardiovascular: RRR, heart sounds normal, no murmur or gallops, trace pretibial peripheral edema  Musculoskeletal: No deformities, no cyanosis or clubbing  Neuro: alert, awake, non focal  Skin: Warm, no lesions or rash     Assessment & Plan:  Tobacco use Tobacco use with probable COPD.  We talked today about doing pulmonary function testing, starting scheduled BD therapy to see if it would help with his functional capacity, overall breathing.  He wants to hold off for now.  He does have an albuterol inhaler that he uses intermittently.  We will revisit this when we follow-up in a year, sooner if he has a decline in his functional capacity or breathing.  Did review with him the importance of smoking cessation.  Encouraged him to cut down.  He is not in a position currently to consider setting a quit date.  Snoring He is going to have a home sleep study, ordered by Cardiology.  Suspect he will have OSA and will qualify for CPAP.  Mediastinal adenopathy EBUS performed 08/16/2023.  His nodal biopsies at 4R and 7 were negative for granulomas, negative for malignancy.  No evidence to support mediastinal nodal sarcoidosis although this does not preclude cardiac disease.Marland Kitchen  He continues to follow with Cardiology.  He did have 1 culture that showed 1-2 colonies of Candida glabrata.  In absence of any pulmonary infiltrates suspect that this is not a clinically relevant finding.   Levy Pupa, MD, PhD 08/26/2023, 11:49 AM [facility] [phone] or if no answer before 7:00PM call [phone] For any issues after 7:00PM please call eLink [phone]

## 2023-08-26 NOTE — Assessment & Plan Note (Signed)
 EBUS performed 08/16/2023.  His nodal biopsies at 4R and 7 were negative for granulomas, negative for malignancy.  No evidence to support mediastinal nodal sarcoidosis although this does not preclude cardiac disease.Marland Kitchen  He continues to follow with Cardiology.  He did have 1 culture that showed 1-2 colonies of Candida glabrata.  In absence of any pulmonary infiltrates suspect that this is not a clinically relevant finding.

## 2023-08-26 NOTE — Assessment & Plan Note (Signed)
 He is going to have a home sleep study, ordered by Cardiology.  Suspect he will have OSA and will qualify for CPAP.

## 2023-08-26 NOTE — Assessment & Plan Note (Signed)
 Tobacco use with probable COPD.  We talked today about doing pulmonary function testing, starting scheduled BD therapy to see if it would help with his functional capacity, overall breathing.  He wants to hold off for now.  He does have an albuterol inhaler that he uses intermittently.  We will revisit this when we follow-up in a year, sooner if he has a decline in his functional capacity or breathing.  Did review with him the importance of smoking cessation.  Encouraged him to cut down.  He is not in a position currently to consider setting a quit date.

## 2023-09-01 NOTE — H&P (View-Only) (Signed)
 ADVANCED HEART FAILURE CLINIC NOTE  Referring Physician: Sandre Kitty, MD  Primary Care: Sandre Kitty, MD Primary Cardiologist: Dr. Servando Salina HF: Dr. 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 Wohl first established care with [facility] in October 2019 when he had an abnormal nuclear stress test followed by left heart catheterization in 03/2018 with reportedly minimal plaque.  During that time he was also seen for uncontrolled hypertension in the emergency department.  He was not seen by [facility] health cardiology until December 2023 when he presented to [facility] long with complaints of shortness of breath and a systolic blood pressure over 200 and heart rate of 40.  CTA PE on admission was significant for acute segmental and subsegmental pulmonary emboli.  While admitted patient was also significantly bradycardic with Mobitz 2 heart block.  Echocardiogram during admission with LVEF of 65 to 70%.  Due to persistent symptoms of dyspnea and 2-1 AV block with right bundle branch block on EKG he underwent placement of permanent pacemaker by Dr. Sharlot Gowda in 2024.  Despite placement of permanent pacemaker he had multiple episodes of syncope in August 2024.  Device interrogation by EP with atrial fibrillation with controlled ventricular rates and atrial flutter.  During this time he also had intermittent low blood pressures that were associated with his syncope but etiology could not be determined.  He subsequently underwent cardiac MRI on 10/24 which demonstrated severe LVH of the septum with multifocal LGE that was not present in the areas of hypertrophy.  Patient subsequently sent to heart failure clinic for further evaluation.  He reports that his  father passed in his 76s from a 'heart attack' but did not undergo LHC; he did have an autopsy. I have asked him to obtain records from his autopsy. His grandfather passed from a similar condition.   HRCT notable for hilar adenopathy. EBUS on 08/16/23 negative for granulomas or signs of mediastinal sarcoidosis.   Interval hx:  - Underwent EBUS on 08/16/23; biopsies negative for sarcoid.  - Genetic panel positive for MYH6 - From a functional standpoint, he is limited significantly from severe neck and low back pain followed by NSGY.  - Stable from HFrEF standpoint.   Current Outpatient Medications  Medication Sig Dispense Refill   albuterol (VENTOLIN HFA) 108 (90 Base) MCG/ACT inhaler Inhale 2 puffs into the lungs every 6 (six) hours as needed for wheezing or shortness of breath. 8 g 0   amiodarone (PACERONE) 200 MG tablet Take 2 tablets (400 mg total) by mouth daily. TAKE 400MG  (2) TABLETS TWICE DAILY FOR 7 DAYS- THEN DECREASE TO 400MG  ONCE DAILY THEREAFTER 180 tablet 0   atorvastatin (LIPITOR) 40 MG tablet Take 1 tablet (40 mg total) by mouth daily. 90 tablet 3   carvedilol (COREG) 3.125 MG tablet TAKE 1 TABLET BY MOUTH 2 TIMES DAILY WITH A MEAL. 60 tablet 4   cetirizine (ZYRTEC) 10 MG tablet Take 1 tablet (10 mg total) by mouth daily. 30 tablet 11   DULoxetine (CYMBALTA) 30 MG capsule Take 1 capsule (30 mg total) by mouth as needed (nerve pain).     empagliflozin (JARDIANCE) 10 MG TABS tablet Take 1 tablet (10 mg total) by mouth daily before breakfast. 30 tablet 6   icosapent Ethyl (  VASCEPA) 1 g capsule Take 2 capsules (2 g total) by mouth 2 (two) times daily. 120 capsule 6   metFORMIN (GLUCOPHAGE-XR) 750 MG 24 hr tablet Take 2 tablets (1,500 mg total) by mouth daily. (Patient taking differently: Take 750 mg by mouth in the morning and at bedtime.) 180 tablet 1   oxyCODONE-acetaminophen (PERCOCET) 10-325 MG tablet Take 1-2 tablets by mouth daily as needed for pain.     potassium chloride SA  (KLOR-CON M) 20 MEQ tablet Take 1 tablet (20 mEq total) by mouth 2 (two) times daily. 33 tablet 6   pregabalin (LYRICA) 300 MG capsule Take 300 mg by mouth daily as needed (pain).     rivaroxaban (XARELTO) 20 MG TABS tablet Take 1 tablet (20 mg total) by mouth daily with supper. Okay to restart this medication on 08/17/2023     sacubitril-valsartan (ENTRESTO) 24-26 MG Take 1 tablet by mouth 2 (two) times daily. 60 tablet 6   Semaglutide, 1 MG/DOSE, (OZEMPIC, 1 MG/DOSE,) 4 MG/3ML SOPN Inject 1 mg into the skin once a week.     sildenafil (VIAGRA) 100 MG tablet Take 0.5-1 tablets (50-100 mg total) by mouth daily as needed for erectile dysfunction. 10 tablet 11   spironolactone (ALDACTONE) 25 MG tablet Take 0.5 tablets (12.5 mg total) by mouth daily.     testosterone cypionate (DEPOTESTOSTERONE CYPIONATE) 200 MG/ML injection Inject 1 mL (200 mg total) into the muscle every 14 (fourteen) days. 10 mL 1   torsemide (DEMADEX) 20 MG tablet Take 1 tablet (20 mg total) by mouth every other day.     No current facility-administered medications for this encounter.    No Known Allergies  PHYSICAL EXAM: Vitals:   09/03/23 0844  BP: 130/82  Pulse: 60  SpO2: 98%   GENERAL: NAD Lungs- CTA CARDIAC:  JVP: 6 cm          Normal rate with regular rhythm. No murmur.  Pulses 2+. No edema.  ABDOMEN: Soft, non-tender, non-distended.  EXTREMITIES: Warm and well perfused.  NEUROLOGIC: No obvious FND    DATA REVIEW  ECG: 05/28/23: Atrial flutter with intermittent pacing as per my personal interpretation  ECHO: 02/12/23: LVEF 60 to 65% with normal RV function.  Severe LVH as per my personal interpretation  CMR (04/12/23): 1. Mild decrease in LVEF, LVEF 46%, in the setting of free breathing artifact.  2. There is severe hypertrophy of the LV septum. Though this meets criteria for hypertrophic cardiomyopathy, this is multi-focal late gadolinium enhancement with increase in ECV signal; this is not present  in the areas of hypertrophy. Consider PET imaging for inflammatory cardiomyopathy evaluation.  Cardiac PET (05/19/23):   FDG uptake findings are consistent with active myocardial inflammation/sarcoidosis.   FDG uptake was observed. FDG uptake was focal. FDG uptake was present in the apical to basal lateral, apical anterior and apex segment(s). LV perfusion is abnormal. Defect 1: There is a small defect with mild reduction in uptake present in the apical inferior location(s).   Left ventricular function is abnormal. Global function is moderately reduced. EF: 36%.   Coronary calcium was present on the attenuation correction CT images. Moderate coronary calcifications were present. Coronary calcifications were present in the left anterior descending artery, left circumflex artery and right coronary artery distribution(s).   Electronically signed by Epifanio Lesches, MD  ASSESSMENT & PLAN:  Heart failure with mildly reduced EF, Evaluation for infiltrative cardiomyopathy Etiology of HF: Nonischemic cardiomyopathy with significant LVH.  Although he does not have the  typical characteristics of cardiac sarcoid he does have patchy LGE on cardiac MRI.  Interestingly he has severe LVH that would be more consistent with hypertrophic cardiomyopathy.  Obtaining genetic panel today to rule out HCM. Cardiac PET consistent with sarcoid. He has now had a HRCT which has some hilar adenopathy. EBUS negative for sarcoid. Reached out to Dr. Delton Coombes today who reports he obtained excellent samples. 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 Will discuss case with imaging colleagues.  NYHA class / AHA Stage: Difficult to assess, NYHA IIB-III Volume status & Diuretics: Euvolemic on exam, continue torsemide 20 mg daily Vasodilators: Entresto 24/26mg  BID; SBP at goal. Repeat BMP/BNP today.  Beta-Blocker:  Coreg 3.125 mg twice daily MRA: Spironolactone 12.5 mg daily Cardiometabolic: Jardiance 10 mg daily Devices therapies & Valvulopathies: Dual-chamber permanent pacemaker placed by Dr. Ladona Ridgel for advanced AV block. Will need to upgrade device to ICD.  Advanced therapies: Not indicated  2. Cardiac sarcoid - Reviewed pulmonology notes from 08/26/23; EBUS on 08/16/23 negative for sarcoid.  - Quantiferon-gold negative - Will review vaccination record - See above; PET consistent with sarcoid. EBUS negative, discussed with Dr. Delton Coombes. Genetic panel concerning for HCM. Will discuss with imaging colleagues.   3. Persistent atrial fibrillation/flutter -Noted to be in persistent atrial fibrillation since March 2024 -Continue Xarelto 20 mg daily -Followed by Dr. Ladona Ridgel -Continue Coreg 3.125 mg twice daily -EKG today w/ atrial fibrillation; HR 90.  -Start amio 400mg  BID x 7 days followed by 400mg  daily. Plan on TEE/DCCV. Reports no prior attempts at rhythm control.   4. Coronary artery disease -Nonobstructive mild CAD per left heart cath in 2019. -stable; no angina.   5. Hyperlipidemia -Continue Lipitor 40 mg daily  6. History of PE/DVT and antiphospholipid antibody syndrome -Will require lifelong anticoagulation with Xarelto 20 mg daily.  7. Severe back pain - Followed by NSGY - Based on chart review from 07/26/23, saw Joan Flores; underwent C7-T1 injection for pain control.  - continues to have significant back pain limited him.   I spent 45 minutes caring for this patient today including face to face time, ordering and reviewing labs, calling Dr. Delton Coombes, discussing case with imaging, reviewing PET scan, obtaining genetic panel, discussing risks/benefits & scheduling TEE/DCCV, seeing the patient, documenting in the record, and arranging follow ups.   Sharina Petre Advanced Heart Failure Mechanical Circulatory Support

## 2023-09-01 NOTE — Progress Notes (Incomplete)
 ADVANCED HEART FAILURE CLINIC NOTE  Referring Physician: Sandre Kitty, MD  Primary Care: Sandre Kitty, MD Primary Cardiologist: Dr. Servando Salina HF: Dr. 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 first established care with [facility] in October 2019 when he had an abnormal nuclear stress test followed by left heart catheterization in 03/2018 with reportedly minimal plaque.  During that time he was also seen for uncontrolled hypertension in the emergency department.  He was not seen by [facility] health cardiology until December 2023 when he presented to [facility] long with complaints of shortness of breath and a systolic blood pressure over 200 and heart rate of 40.  CTA PE on admission was significant for acute segmental and subsegmental pulmonary emboli.  While admitted patient was also significantly bradycardic with Mobitz 2 heart block.  Echocardiogram during admission with LVEF of 65 to 70%.  Due to persistent symptoms of dyspnea and 2-1 AV block with right bundle branch block on EKG he underwent placement of permanent pacemaker by Dr. Sharlot Gowda in 2024.  Despite placement of permanent pacemaker he had multiple episodes of syncope in August 2024.  Device interrogation by EP with atrial fibrillation with controlled ventricular rates and atrial flutter.  During this time he also had intermittent low blood pressures that were associated with his syncope but etiology could not be determined.  He subsequently underwent cardiac MRI on 10/24 which demonstrated severe LVH of the septum with multifocal LGE that was not present in the areas of hypertrophy.  Patient subsequently sent to heart failure clinic for further evaluation.  No reported family  history of infiltrative heart disease, autoimmune disease, sarcoid or celiac that/need for pacemakers in their family.  They do have some history of coronary artery disease that appears to be unrelated.  Interval hx:  Since his last appt, he has had a HRCT which was notable for hilar adenopathy. He has continued to struggle with severe back pain now s/p injections for pain control. From a HFrEF standpoint, he becomes short of breath when walking more than 20ft but reports significant improvement from his prior evaluation. He no longer has lower extremity edema and has had some improvement in abdominal protuberance.   Current Outpatient Medications  Medication Sig Dispense Refill   albuterol (VENTOLIN HFA) 108 (90 Base) MCG/ACT inhaler Inhale 2 puffs into the lungs every 6 (six) hours as needed for wheezing or shortness of breath. 8 g 0   atorvastatin (LIPITOR) 40 MG tablet Take 1 tablet (40 mg total) by mouth daily. 90 tablet 3   carvedilol (COREG) 3.125 MG tablet TAKE 1 TABLET BY MOUTH 2 TIMES DAILY WITH A MEAL. 60 tablet 4   cetirizine (ZYRTEC) 10 MG tablet Take 1 tablet (10 mg total) by mouth daily. 30 tablet 11   DULoxetine (CYMBALTA) 30 MG capsule Take 1 capsule (30 mg total) by mouth as needed (nerve pain).     empagliflozin (JARDIANCE) 10 MG TABS tablet Take 1 tablet (10 mg total) by mouth daily before breakfast. 30 tablet 6   icosapent Ethyl (VASCEPA) 1 g capsule Take 2 capsules (2 g total) by mouth 2 (two) times daily. 120 capsule 6   metFORMIN (GLUCOPHAGE-XR) 750 MG 24 hr tablet Take 2 tablets (1,500 mg total)  by mouth daily. (Patient taking differently: Take 750 mg by mouth in the morning and at bedtime.) 180 tablet 1   oxyCODONE-acetaminophen (PERCOCET) 10-325 MG tablet Take 1-2 tablets by mouth daily as needed for pain.     potassium chloride SA (KLOR-CON M) 20 MEQ tablet Take 1 tablet (20 mEq total) by mouth 2 (two) times daily. 33 tablet 6   pregabalin (LYRICA) 300 MG capsule Take 300  mg by mouth daily as needed (pain).     rivaroxaban (XARELTO) 20 MG TABS tablet Take 1 tablet (20 mg total) by mouth daily with supper. Okay to restart this medication on 08/17/2023     sacubitril-valsartan (ENTRESTO) 24-26 MG Take 1 tablet by mouth 2 (two) times daily. 60 tablet 6   Semaglutide, 1 MG/DOSE, (OZEMPIC, 1 MG/DOSE,) 4 MG/3ML SOPN Inject 1 mg into the skin once a week.     sildenafil (VIAGRA) 100 MG tablet Take 0.5-1 tablets (50-100 mg total) by mouth daily as needed for erectile dysfunction. 10 tablet 11   spironolactone (ALDACTONE) 25 MG tablet Take 0.5 tablets (12.5 mg total) by mouth daily.     testosterone cypionate (DEPOTESTOSTERONE CYPIONATE) 200 MG/ML injection Inject 1 mL (200 mg total) into the muscle every 14 (fourteen) days. 10 mL 1   torsemide (DEMADEX) 20 MG tablet Take 1 tablet (20 mg total) by mouth every other day.     No current facility-administered medications for this visit.    No Known Allergies  PHYSICAL EXAM: There were no vitals filed for this visit. GENERAL: NAD Lungs- *** CARDIAC:  JVP: *** cm          Normal rate with regular rhythm. *** murmur.  Pulses ***. *** edema.  ABDOMEN: Soft, non-tender, non-distended.  EXTREMITIES: Warm and well perfused.  NEUROLOGIC: No obvious FND    DATA REVIEW  ECG: 05/28/23: Atrial flutter with intermittent pacing as per my personal interpretation  ECHO: 02/12/23: LVEF 60 to 65% with normal RV function.  Severe LVH as per my personal interpretation  CMR (04/12/23): 1. Mild decrease in LVEF, LVEF 46%, in the setting of free breathing artifact.  2. There is severe hypertrophy of the LV septum. Though this meets criteria for hypertrophic cardiomyopathy, this is multi-focal late gadolinium enhancement with increase in ECV signal; this is not present in the areas of hypertrophy. Consider PET imaging for inflammatory cardiomyopathy evaluation.  Cardiac PET (05/19/23):   FDG uptake findings are consistent with  active myocardial inflammation/sarcoidosis.   FDG uptake was observed. FDG uptake was focal. FDG uptake was present in the apical to basal lateral, apical anterior and apex segment(s). LV perfusion is abnormal. Defect 1: There is a small defect with mild reduction in uptake present in the apical inferior location(s).   Left ventricular function is abnormal. Global function is moderately reduced. EF: 36%.   Coronary calcium was present on the attenuation correction CT images. Moderate coronary calcifications were present. Coronary calcifications were present in the left anterior descending artery, left circumflex artery and right coronary artery distribution(s).   Electronically signed by Epifanio Lesches, MD  ASSESSMENT & PLAN:  Heart failure with mildly reduced EF, Evaluation for infiltrative cardiomyopathy Etiology of HF: Nonischemic cardiomyopathy with significant LVH.  Although he does not have the typical characteristics of cardiac sarcoid he does have patchy LGE on cardiac MRI.  Interestingly he has severe LVH that would be more consistent with hypertrophic cardiomyopathy.  Obtaining genetic panel today to rule out HCM. Cardiac PET consistent with sarcoid. He  has now had a HRCT which has some hilar adenopathy. I discussed with Dr. Judeth Horn today. Will be evaluated by pulmonology for endobronchial biopsy.  NYHA class / AHA Stage: Difficult to assess, he is a very poor historian.  Likely NYHA IIb-III Volume status & Diuretics: Euvolemic on exam, continue torsemide 20 mg daily Vasodilators: Entresto 24/26mg  BID; D/C hydralazine. Repeat BMP today.  Beta-Blocker: Coreg 3.125 mg twice daily MRA: Spironolactone 12.5 mg daily Cardiometabolic: Jardiance 10 mg daily Devices therapies & Valvulopathies: Dual-chamber permanent pacemaker placed by Dr. Ladona Ridgel for advanced AV block. Advanced therapies: Not indicated  2. Cardiac sarcoid - Discussed with Dr. Judeth Horn; will see pulm for possible EBUS -  Quantiferon-gold today - Will review vaccination record - Plan to start dual therapy with prednisone & cellcept within the next month pending biopsy and above work-up.   3. Persistent atrial fibrillation/flutter -Noted to be in persistent atrial fibrillation since March 2024 -Continue Xarelto 20 mg daily -Followed by Dr. Ladona Ridgel -Continue Coreg 3.125 mg twice daily -EKG today with persistent atrial fibrillation; once EBUS is completed with plan on cardioversion.   4. Coronary artery disease -Nonobstructive mild CAD per left heart cath in 2019. -stable with no angina  5. Hyperlipidemia -Continue Lipitor 40 mg daily  6. History of PE/DVT and antiphospholipid antibody syndrome -Will require lifelong anticoagulation with Xarelto 20 mg daily.  7. Severe back pain - Followed by NSGY - Based on chart review from 07/26/23, saw Joan Flores; underwent C7-T1 injection for pain control.   Jerris Keltz Advanced Heart Failure Mechanical Circulatory Support

## 2023-09-03 ENCOUNTER — Encounter (HOSPITAL_COMMUNITY): Payer: Self-pay | Admitting: Cardiology

## 2023-09-03 ENCOUNTER — Encounter (HOSPITAL_BASED_OUTPATIENT_CLINIC_OR_DEPARTMENT_OTHER): Payer: BC Managed Care – PPO | Admitting: Cardiology

## 2023-09-03 ENCOUNTER — Other Ambulatory Visit: Payer: Self-pay | Admitting: Family Medicine

## 2023-09-03 ENCOUNTER — Ambulatory Visit (HOSPITAL_COMMUNITY)
Admission: RE | Admit: 2023-09-03 | Discharge: 2023-09-03 | Disposition: A | Discharge status: Home / Self Care | Payer: BC Managed Care – PPO | Source: Ambulatory Visit | Attending: Cardiology | Admitting: Cardiology

## 2023-09-03 VITALS — BP 130/82 | HR 60 | Ht 73.0 in | Wt 301.6 lb

## 2023-09-03 DIAGNOSIS — I442 Atrioventricular block, complete: Secondary | ICD-10-CM | POA: Diagnosis not present

## 2023-09-03 DIAGNOSIS — I2510 Atherosclerotic heart disease of native coronary artery without angina pectoris: Secondary | ICD-10-CM | POA: Diagnosis not present

## 2023-09-03 DIAGNOSIS — I4892 Unspecified atrial flutter: Secondary | ICD-10-CM | POA: Diagnosis not present

## 2023-09-03 DIAGNOSIS — D8685 Sarcoid myocarditis: Secondary | ICD-10-CM

## 2023-09-03 DIAGNOSIS — I493 Ventricular premature depolarization: Secondary | ICD-10-CM | POA: Insufficient documentation

## 2023-09-03 DIAGNOSIS — Z950 Presence of cardiac pacemaker: Secondary | ICD-10-CM | POA: Insufficient documentation

## 2023-09-03 DIAGNOSIS — I428 Other cardiomyopathies: Secondary | ICD-10-CM | POA: Insufficient documentation

## 2023-09-03 DIAGNOSIS — I5022 Chronic systolic (congestive) heart failure: Secondary | ICD-10-CM

## 2023-09-03 DIAGNOSIS — M545 Low back pain, unspecified: Secondary | ICD-10-CM | POA: Diagnosis not present

## 2023-09-03 DIAGNOSIS — Z79899 Other long term (current) drug therapy: Secondary | ICD-10-CM | POA: Diagnosis not present

## 2023-09-03 DIAGNOSIS — Z8249 Family history of ischemic heart disease and other diseases of the circulatory system: Secondary | ICD-10-CM | POA: Insufficient documentation

## 2023-09-03 DIAGNOSIS — I5020 Unspecified systolic (congestive) heart failure: Secondary | ICD-10-CM | POA: Diagnosis not present

## 2023-09-03 DIAGNOSIS — R590 Localized enlarged lymph nodes: Secondary | ICD-10-CM | POA: Insufficient documentation

## 2023-09-03 DIAGNOSIS — I10 Essential (primary) hypertension: Secondary | ICD-10-CM | POA: Diagnosis not present

## 2023-09-03 DIAGNOSIS — I110 Hypertensive heart disease with heart failure: Secondary | ICD-10-CM | POA: Diagnosis not present

## 2023-09-03 DIAGNOSIS — Z7984 Long term (current) use of oral hypoglycemic drugs: Secondary | ICD-10-CM | POA: Diagnosis not present

## 2023-09-03 DIAGNOSIS — Z7901 Long term (current) use of anticoagulants: Secondary | ICD-10-CM | POA: Diagnosis not present

## 2023-09-03 DIAGNOSIS — Z7985 Long-term (current) use of injectable non-insulin antidiabetic drugs: Secondary | ICD-10-CM | POA: Insufficient documentation

## 2023-09-03 DIAGNOSIS — I517 Cardiomegaly: Secondary | ICD-10-CM

## 2023-09-03 DIAGNOSIS — M542 Cervicalgia: Secondary | ICD-10-CM | POA: Diagnosis not present

## 2023-09-03 DIAGNOSIS — E119 Type 2 diabetes mellitus without complications: Secondary | ICD-10-CM | POA: Diagnosis not present

## 2023-09-03 DIAGNOSIS — E785 Hyperlipidemia, unspecified: Secondary | ICD-10-CM | POA: Diagnosis not present

## 2023-09-03 DIAGNOSIS — D6861 Antiphospholipid syndrome: Secondary | ICD-10-CM | POA: Diagnosis not present

## 2023-09-03 DIAGNOSIS — Z86711 Personal history of pulmonary embolism: Secondary | ICD-10-CM | POA: Insufficient documentation

## 2023-09-03 DIAGNOSIS — I4819 Other persistent atrial fibrillation: Secondary | ICD-10-CM | POA: Diagnosis not present

## 2023-09-03 DIAGNOSIS — Z86718 Personal history of other venous thrombosis and embolism: Secondary | ICD-10-CM | POA: Diagnosis not present

## 2023-09-03 DIAGNOSIS — I4811 Longstanding persistent atrial fibrillation: Secondary | ICD-10-CM

## 2023-09-03 LAB — BRAIN NATRIURETIC PEPTIDE: B Natriuretic Peptide: 96.5 pg/mL (ref 0.0–100.0)

## 2023-09-03 LAB — COMPREHENSIVE METABOLIC PANEL
ALT: 26 U/L (ref 0–44)
AST: 33 U/L (ref 15–41)
Albumin: 3.7 g/dL (ref 3.5–5.0)
Alkaline Phosphatase: 45 U/L (ref 38–126)
Anion gap: 7 (ref 5–15)
BUN: 18 mg/dL (ref 6–20)
CO2: 29 mmol/L (ref 22–32)
Calcium: 9.8 mg/dL (ref 8.9–10.3)
Chloride: 102 mmol/L (ref 98–111)
Creatinine, Ser: 1.07 mg/dL (ref 0.61–1.24)
GFR, Estimated: >60 mL/min (ref >60)
Glucose, Bld: 162 mg/dL — ABNORMAL HIGH (ref 70–99)
Potassium: 4.5 mmol/L (ref 3.5–5.1)
Sodium: 138 mmol/L (ref 135–145)
Total Bilirubin: 0.7 mg/dL (ref 0.0–1.2)
Total Protein: 6.7 g/dL (ref 6.5–8.1)

## 2023-09-03 LAB — CBC
HCT: 45.7 % (ref 39.0–52.0)
Hemoglobin: 15.4 g/dL (ref 13.0–17.0)
MCH: 32.6 pg (ref 26.0–34.0)
MCHC: 33.7 g/dL (ref 30.0–36.0)
MCV: 96.8 fL (ref 80.0–100.0)
Platelets: 227 10*3/uL (ref 150–400)
RBC: 4.72 MIL/uL (ref 4.22–5.81)
RDW: 14.6 % (ref 11.5–15.5)
WBC: 8.9 10*3/uL (ref 4.0–10.5)
nRBC: 0 % (ref 0.0–0.2)

## 2023-09-03 MED ORDER — AMIODARONE HCL 200 MG PO TABS: 400.0000 mg | ORAL_TABLET | Freq: Every day | ORAL | 0 refills | Status: DC

## 2023-09-03 NOTE — Patient Instructions (Addendum)
 Medication Changes:  START: AMIODARONE 400MG  TWICE DAILY FOR 7 DAYS   THEN DECREASE AMIODARONE 400MG  ONCE DAILY THEREAFTER   Lab Work:  Labs done today, your results will be available in MyChart, we will contact you for abnormal readings.  Testing/Procedures:  Your physician has recommended that you have a Cardioversion (DCCV). Electrical Cardioversion uses a jolt of electricity to your heart either through paddles or wired patches attached to your chest. This is a controlled, usually prescheduled, procedure. Defibrillation is done under light anesthesia in the hospital, and you usually go home the day of the procedure. This is done to get your heart back into a normal rhythm. You are not awake for the procedure. Please see the instruction sheet given to you today.  Special Instructions // Education:     Dear Christopher Green are scheduled for a TEE (Transesophageal Echocardiogram) Guided Cardioversion on Wednesday, April 9 with Dr. Gasper Lloyd.  Please arrive at the [facility] (Main Entrance A) at [facility]: [address] [place], [place] [address] at 8:00 AM (This time is 1 hour(s) before your procedure to ensure your preparation).   Free valet parking service is available. You will check in at ADMITTING.   *Please Note: You will receive a call the day before your procedure to confirm the appointment time. That time may have changed from the original time based on the schedule for that day.*  DIET:  Nothing to eat or drink after midnight except a sip of water with medications (see medication instructions below)  MEDICATION INSTRUCTIONS: !!IF ANY NEW MEDICATIONS ARE STARTED AFTER TODAY, PLEASE NOTIFY YOUR PROVIDER AS SOON AS POSSIBLE!!  FYI: Medications such as Semaglutide (Ozempic, [place]), Tirzepatide (Mounjaro, Zepbound), Dulaglutide (Trulicity), etc ("GLP1 agonists") AND Canagliflozin (Invokana), Dapagliflozin (Farxiga), Empagliflozin (Jardiance), Ertugliflozin  (Steglatro), Bexagliflozin [employer]) or any combination with one of these drugs such as Invokamet (Canagliflozin/Metformin), Synjardy (Empagliflozin/Metformin), etc ("SGLT2 inhibitors") must be held around the time of a procedure. This is not a comprehensive list of all of these drugs. Please review all of your medications and talk to your provider if you take any one of these. If you are not sure, ask your provider.          :1}HOLD: Semaglutide (Ozempic, Rybelsus, Wegovy) for 7 days prior to the procedure.         :1}HOLD: Empagliflozin (Jardiance) for 3 days prior to the procedure. Last dose on Sunday, April 06.  ` HOLD: METFORMIN- DO NOT TAKE THE MORNING OF PROCEDURE   HOLD: DO NOT TAKE SPIRONOLACTONE OR TORSEMIDE THE MORNING OF PROCEDURE       :1}Continue taking your anticoagulant (blood thinner): Rivaroxaban (Xarelto).  You will need to continue this after your procedure until you are told by your provider that it is safe to stop.    LABS: TODAY   FYI:  For your safety, and to allow [place] to monitor your vital signs accurately during the surgery/procedure we request: If you have artificial nails, gel coating, SNS etc, please have those removed prior to your surgery/procedure. Not having the nail coverings /polish removed may result in cancellation or delay of your surgery/procedure.  Your support person will be asked to wait in the waiting room during your procedure.  It is OK to have someone drop you off and come back when you are ready to be discharged.  You cannot drive after the procedure and will need someone to drive you home.  Bring your insurance cards.  *Special  Note: Every effort is made to have your procedure done on time. Occasionally there are emergencies that occur at the hospital that may cause delays. Please be patient if a delay does occur.   Follow-Up in: 2 MONTHS AS SCHEDULED   At the [facility], you and your health needs are our priority. We have a  designated team specialized in the treatment of Heart Failure. This Care Team includes your primary Heart Failure Specialized Cardiologist (physician), Advanced Practice Providers (APPs- Physician Assistants and Nurse Practitioners), and Pharmacist who all work together to provide you with the care you need, when you need it.   You may see any of the following providers on your designated Care Team at your next follow up:  Dr. Arvilla Meres Dr. Marca Ancona Dr. Dorthula Nettles Dr. Theresia Bough Tonye Becket, NP Robbie Lis, [place] [facility] [place], [place] Brynda Peon, NP [place] Lee, NP Karle Plumber, PharmD   Please be sure to bring in all your medications bottles to every appointment.   Need to Contact us:  If you have any questions or concerns before your next appointment please send [place] a message through [employer] or call our office at [phone].    TO LEAVE A MESSAGE FOR THE NURSE SELECT OPTION 2, PLEASE LEAVE A MESSAGE INCLUDING: YOUR NAME DATE OF BIRTH CALL BACK NUMBER REASON FOR CALL**this is important as we prioritize the call backs  YOU WILL RECEIVE A CALL BACK THE SAME DAY AS LONG AS YOU CALL BEFORE 4:00 PM

## 2023-09-03 NOTE — Telephone Encounter (Signed)
 Copied from CRM [id number]. Topic: Clinical - Medication Refill >> Sep 03, 2023 10:51 AM Truddie Crumble wrote: Most Recent Primary Care Visit:  Provider: Sandre Kitty  Department: [facility]  Visit Type: OFFICE VISIT  Date: 08/10/2023  Medication: oxyCODONE-acetaminophen (PERCOCET) 10-325 MG tablet  Has the patient contacted their pharmacy? No (Agent: If no, request that the patient contact the pharmacy for the refill. If patient does not wish to contact the pharmacy document the reason why and proceed with request.) (Agent: If yes, when and what did the pharmacy advise?)  Is this the correct pharmacy for this prescription? Yes If no, delete pharmacy and type the correct one.  This is the patient's preferred pharmacy:  [facility] - [place], [place] - [address] [place] [address] Phone: [phone] Fax: [phone]   Has the prescription been filled recently? Yes  Is the patient out of the medication? Yes  Has the patient been seen for an appointment in the last year OR does the patient have an upcoming appointment? Yes  Can we respond through MyChart? Yes  Agent: Please be advised that Rx refills may take up to 3 business days. We ask that you follow-up with your pharmacy.

## 2023-09-04 ENCOUNTER — Other Ambulatory Visit: Payer: Self-pay | Admitting: Family Medicine

## 2023-09-07 ENCOUNTER — Other Ambulatory Visit (HOSPITAL_COMMUNITY): Payer: Self-pay

## 2023-09-07 DIAGNOSIS — I4819 Other persistent atrial fibrillation: Secondary | ICD-10-CM

## 2023-09-07 NOTE — Telephone Encounter (Signed)
 Can you call the patient to make sure he is aware of the prescription that was sent on 3/21.

## 2023-09-07 NOTE — Progress Notes (Signed)
 Orders placed for TEE/DCCV scheduled 4/9 with Dr. Gasper Lloyd

## 2023-09-10 ENCOUNTER — Telehealth (HOSPITAL_COMMUNITY): Payer: Self-pay | Admitting: Cardiology

## 2023-09-10 ENCOUNTER — Encounter (INDEPENDENT_AMBULATORY_CARE_PROVIDER_SITE_OTHER): Payer: Self-pay | Admitting: Cardiology

## 2023-09-10 DIAGNOSIS — G4733 Obstructive sleep apnea (adult) (pediatric): Secondary | ICD-10-CM | POA: Diagnosis not present

## 2023-09-10 NOTE — Telephone Encounter (Signed)
 Pt called to report he attempted to complete home sleep study  Reports he was told device was not registered    Serial number [id number] registered with watchpat/[employer]

## 2023-09-12 NOTE — Pre-Procedure Instructions (Signed)
 Attempted to call patient regarding procedure on 09/22/23 with anesthesia.  Anesthesia requires Ozempic to be held for 7 days before procedure. Unable to leave a voicemail due to mail box full.  Last day can take Ozempic is 4/1,  don't take after 4/1 until after procedure.

## 2023-09-13 ENCOUNTER — Other Ambulatory Visit (HOSPITAL_COMMUNITY): Payer: Self-pay | Admitting: Cardiology

## 2023-09-14 ENCOUNTER — Ambulatory Visit: Attending: Cardiology

## 2023-09-14 DIAGNOSIS — I10 Essential (primary) hypertension: Secondary | ICD-10-CM

## 2023-09-14 NOTE — Procedures (Signed)
   SLEEP STUDY REPORT Patient Information Study Date: 09/10/2023 Patient Name: Christopher Green Patient ID: [id number] Birth Date: Nov 22, 1970 Age: 53 Gender: Male BMI: 40.0 (W=302 lb, H=6' 1'') Referring Physician: Loletta Specter, DO  TEST DESCRIPTION: Home sleep apnea testing was completed using the WatchPat, a Type 1 device, utilizing peripheral arterial tonometry (PAT), chest movement, actigraphy, pulse oximetry, pulse rate, body position and snore. AHI was calculated with apnea and hypopnea using valid sleep time as the denominator. RDI includes apneas, hypopneas, and RERAs. The data acquired and the scoring of sleep and all associated events were performed in accordance with the recommended standards and specifications as outlined in the AASM Manual for the Scoring of Sleep and Associated Events 2.2.0 (2015).  FINDINGS:  1. Mild Obstructive Sleep Apnea with AHI 12.9/hr.  2. No Central Sleep Apnea with pAHIc 1.1/hr.  3. Oxygen desaturations as low as 89%.  4. Severe snoring was present. O2 sats were < 88% for 0 min.  5. Total sleep time was 3 hrs and 33 min.  6. 2.9% of total sleep time was spent in REM sleep.  7. Normal sleep onset latency at 19 min.  8. Prolonged REM sleep onset latency at 156 min.  9. Total awakenings were 8. 10. Arrhythmia detection: Suggestive of possible brief atrial fibrillation lasting 1 minutes and 3 seconds. This is not diagnostic and further testing with outpatient telemetry monitoring is recommended.  DIAGNOSIS: Mild Obstructive Sleep Apnea (G47.33) Possible Atrial Fibrillation  RECOMMENDATIONS: 1. Clinical correlation of these findings is necessary. The decision to treat obstructive sleep apnea (OSA) is usually based on the presence of apnea symptoms or the presence of associated medical conditions such as Hypertension, Congestive Heart Failure, Atrial Fibrillation or Obesity. The most common symptoms of OSA are snoring, gasping for breath while  sleeping, daytime sleepiness and fatigue. 2. Initiating apnea therapy is recommended given the presence of symptoms and/or associated conditions. Recommend proceeding with one of the following:   a. Auto-CPAP therapy with a pressure range of 5-20cm H2O.   b. An oral appliance (OA) that can be obtained from certain dentists with expertise in sleep medicine. These are primarily of use in non-obese patients with mild and moderate disease.   c. An ENT consultation which may be useful to look for specific causes of obstruction and possible treatment options.   d. If patient is intolerant to PAP therapy, consider referral to ENT for evaluation for hypoglossal nerve stimulator.  3. Close follow-up is necessary to ensure success with CPAP or oral appliance therapy for maximum benefit .  4. A follow-up oximetry study on CPAP is recommended to assess the adequacy of therapy and determine the need for supplemental oxygen or the potential need for Bi-level therapy. An arterial blood gas to determine the adequacy of baseline ventilation and oxygenation should also be considered.  5. Healthy sleep recommendations include: adequate nightly sleep (normal 7-9 hrs/night), avoidance of caffeine after noon and alcohol near bedtime, and maintaining a sleep environment that is cool, dark and quiet.  6. Weight loss for overweight patients is recommended. Even modest amounts of weight loss can significantly improve the severity of sleep apnea.  7. Snoring recommendations include: weight loss where appropriate, side sleeping, and avoidance of alcohol before bed.  8. Operation of motor vehicle should be avoided when sleepy.  Signature: Armanda Magic, MD; [facility]; Diplomat, American Board of Sleep Medicine Electronically Signed: 09/14/2023 6:42:05 PM

## 2023-09-16 ENCOUNTER — Telehealth (HOSPITAL_COMMUNITY): Payer: Self-pay

## 2023-09-16 ENCOUNTER — Telehealth: Payer: Self-pay

## 2023-09-16 LAB — FUNGUS CULTURE WITH STAIN

## 2023-09-16 LAB — FUNGAL ORGANISM REFLEX

## 2023-09-16 LAB — FUNGUS CULTURE RESULT

## 2023-09-16 NOTE — Telephone Encounter (Signed)
 Notified patient of sleep study results and recommendations. All questions (if any) were answered.  Order for an auto CPAP from 4-15cm H2O with heated humidity, mask of choice, and order overnight pulse ox on CPAP sent to [facility] today.

## 2023-09-16 NOTE — Telephone Encounter (Signed)
 Marland Kitchen

## 2023-09-16 NOTE — Addendum Note (Signed)
 Addended by: Brunetta Genera on: 09/16/2023 08:22 AM   Modules accepted: Orders

## 2023-09-16 NOTE — Telephone Encounter (Signed)
-----   Message from Armanda Magic sent at 09/14/2023  6:43 PM EDT ----- Please let patient know that they have sleep apnea and recommend treating with CPAP.  Please order an auto CPAP from 4-15cm H2O with heated humidity and mask of choice.  Order overnight pulse ox on CPAP.  Followup with me in 6 weeks.

## 2023-09-21 ENCOUNTER — Telehealth (HOSPITAL_COMMUNITY): Payer: Self-pay

## 2023-09-21 NOTE — Telephone Encounter (Signed)
 Received a fax requesting medical records from [facility]. Records were successfully faxed to: [phone] ,which was the number provided.. Medical request form will be scanned into patients chart.

## 2023-09-21 NOTE — Progress Notes (Signed)
 Pt called for pre procedure instructions. Arrival time 0730 NPO after midnight explained. Instructed to take am meds with sip of water and confirmed blood thinner consistency.  Has been taking Xarelto with no missed doses.  Pt reports has not had Ozempic in months. Instructed pt need for ride home tomorrow and have responsible adult with them for 24 hrs post procedure.

## 2023-09-21 NOTE — Addendum Note (Signed)
 Addended by: Geralyn Flash D on: 09/21/2023 01:06 PM   Modules accepted: Orders

## 2023-09-21 NOTE — Progress Notes (Signed)
 Remote pacemaker transmission.

## 2023-09-21 NOTE — Anesthesia Preprocedure Evaluation (Signed)
 Anesthesia Evaluation  Patient identified by MRN, date of birth, ID band Patient awake    Reviewed: Allergy & Precautions, NPO status , Patient's Chart, lab work & pertinent test results  History of Anesthesia Complications Negative for: history of anesthetic complications  Airway Mallampati: III  TM Distance: >3 FB Neck ROM: Full   Comment: Previous grade II view with Miller 3, easy mask with OPA Dental  (+) Poor Dentition, Dental Advisory Given   Pulmonary neg shortness of breath, sleep apnea (mild) , neg COPD, neg recent URI, Current SmokerPatient did not abstain from smoking., PE (05/25/2022)   Pulmonary exam normal breath sounds clear to auscultation       Cardiovascular hypertension, Pt. on home beta blockers (-) angina + CAD and + DVT  (-) Past MI, (-) Cardiac Stents and (-) CABG + dysrhythmias (Type 2 second degree AV block) Atrial Fibrillation + pacemaker  Rhythm:Irregular Rate:Normal  Cardiac sarcoidosis, HLD  TTE 02/12/2023: IMPRESSIONS    1. Left ventricular ejection fraction, by estimation, is 60 to 65%. The  left ventricle has normal function. The left ventricle has no regional  wall motion abnormalities. There is severe concentric left ventricular  hypertrophy. Left ventricular diastolic   parameters are indeterminate.   2. Right ventricular systolic function is normal. The right ventricular  size is normal.   3. The mitral valve is normal in structure. Trivial mitral valve  regurgitation. No evidence of mitral stenosis.   4. The aortic valve is tricuspid. Aortic valve regurgitation is mild.  Aortic valve sclerosis is present, with no evidence of aortic valve  stenosis.   5. The inferior vena cava is normal in size with greater than 50%  respiratory variability, suggesting right atrial pressure of 3 mmHg.     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) Lab Results      Component                Value               Date                      WBC                      8.9                 09/03/2023                HGB                      15.4                09/03/2023                HCT                      45.7                09/03/2023  MCV                      96.8                09/03/2023                PLT                      227                 09/03/2023              Anesthesia Other Findings Last Xarelto: last night  Last Ozempic: 3-4 months ago  Reproductive/Obstetrics                             Anesthesia Physical Anesthesia Plan  ASA: 3  Anesthesia Plan: MAC   Post-op Pain Management:    Induction: Intravenous  PONV Risk Score and Plan: 0 and Propofol infusion, TIVA and Treatment may vary due to age or medical condition  Airway Management Planned: Natural Airway and Simple Face Mask  Additional Equipment:   Intra-op Plan:   Post-operative Plan:   Informed Consent: I have reviewed the patients History and Physical, chart, labs and discussed the procedure including the risks, benefits and alternatives for the proposed anesthesia with the patient or authorized representative who has indicated his/her understanding and acceptance.     Dental advisory given  Plan Discussed with: CRNA and Anesthesiologist  Anesthesia Plan Comments: (Discussed with patient risks of MAC including, but not limited to, minor pain or discomfort, hearing people in the room, and possible need for backup general anesthesia. Risks for general anesthesia also discussed  including, but not limited to, sore throat, hoarse voice, chipped/damaged teeth, injury to vocal cords, nausea and vomiting, allergic reactions, lung infection, heart attack, stroke, and death. All questions answered. )       Anesthesia Quick Evaluation

## 2023-09-22 ENCOUNTER — Other Ambulatory Visit: Payer: Self-pay

## 2023-09-22 ENCOUNTER — Encounter (HOSPITAL_COMMUNITY): Payer: Self-pay | Admitting: Cardiology

## 2023-09-22 ENCOUNTER — Ambulatory Visit (HOSPITAL_COMMUNITY)
Admission: RE | Admit: 2023-09-22 | Discharge: 2023-09-22 | Disposition: A | Discharge status: Home / Self Care | Attending: Cardiology | Admitting: Cardiology

## 2023-09-22 ENCOUNTER — Ambulatory Visit (HOSPITAL_COMMUNITY): Payer: Self-pay | Admitting: Anesthesiology

## 2023-09-22 ENCOUNTER — Encounter (HOSPITAL_COMMUNITY)
Admission: RE | Disposition: A | Discharge status: Home / Self Care | Payer: Self-pay | Source: Home / Self Care | Attending: Cardiology

## 2023-09-22 ENCOUNTER — Ambulatory Visit (HOSPITAL_COMMUNITY)
Admission: RE | Admit: 2023-09-22 | Discharge: 2023-09-22 | Disposition: A | Discharge status: Home / Self Care | Source: Ambulatory Visit | Attending: Cardiology | Admitting: Cardiology

## 2023-09-22 DIAGNOSIS — D869 Sarcoidosis, unspecified: Secondary | ICD-10-CM | POA: Insufficient documentation

## 2023-09-22 DIAGNOSIS — I110 Hypertensive heart disease with heart failure: Secondary | ICD-10-CM | POA: Diagnosis not present

## 2023-09-22 DIAGNOSIS — Z79899 Other long term (current) drug therapy: Secondary | ICD-10-CM | POA: Insufficient documentation

## 2023-09-22 DIAGNOSIS — I5022 Chronic systolic (congestive) heart failure: Secondary | ICD-10-CM | POA: Diagnosis not present

## 2023-09-22 DIAGNOSIS — Z86711 Personal history of pulmonary embolism: Secondary | ICD-10-CM | POA: Insufficient documentation

## 2023-09-22 DIAGNOSIS — I428 Other cardiomyopathies: Secondary | ICD-10-CM | POA: Insufficient documentation

## 2023-09-22 DIAGNOSIS — Z86718 Personal history of other venous thrombosis and embolism: Secondary | ICD-10-CM | POA: Diagnosis not present

## 2023-09-22 DIAGNOSIS — E119 Type 2 diabetes mellitus without complications: Secondary | ICD-10-CM | POA: Diagnosis not present

## 2023-09-22 DIAGNOSIS — M549 Dorsalgia, unspecified: Secondary | ICD-10-CM | POA: Insufficient documentation

## 2023-09-22 DIAGNOSIS — E785 Hyperlipidemia, unspecified: Secondary | ICD-10-CM | POA: Diagnosis not present

## 2023-09-22 DIAGNOSIS — Z7984 Long term (current) use of oral hypoglycemic drugs: Secondary | ICD-10-CM | POA: Diagnosis not present

## 2023-09-22 DIAGNOSIS — I2510 Atherosclerotic heart disease of native coronary artery without angina pectoris: Secondary | ICD-10-CM | POA: Diagnosis not present

## 2023-09-22 DIAGNOSIS — I4819 Other persistent atrial fibrillation: Secondary | ICD-10-CM | POA: Diagnosis not present

## 2023-09-22 DIAGNOSIS — Z950 Presence of cardiac pacemaker: Secondary | ICD-10-CM | POA: Diagnosis not present

## 2023-09-22 DIAGNOSIS — I442 Atrioventricular block, complete: Secondary | ICD-10-CM | POA: Insufficient documentation

## 2023-09-22 DIAGNOSIS — Z7985 Long-term (current) use of injectable non-insulin antidiabetic drugs: Secondary | ICD-10-CM | POA: Diagnosis not present

## 2023-09-22 DIAGNOSIS — I4891 Unspecified atrial fibrillation: Secondary | ICD-10-CM

## 2023-09-22 DIAGNOSIS — Z7901 Long term (current) use of anticoagulants: Secondary | ICD-10-CM | POA: Insufficient documentation

## 2023-09-22 DIAGNOSIS — I10 Essential (primary) hypertension: Secondary | ICD-10-CM | POA: Diagnosis not present

## 2023-09-22 HISTORY — PX: CARDIOVERSION: EP1203

## 2023-09-22 HISTORY — PX: TRANSESOPHAGEAL ECHOCARDIOGRAM (CATH LAB): EP1270

## 2023-09-22 LAB — GLUCOSE, CAPILLARY: Glucose-Capillary: 123 mg/dL — ABNORMAL HIGH (ref 70–99)

## 2023-09-22 SURGERY — TRANSESOPHAGEAL ECHOCARDIOGRAM (TEE) (CATHLAB)
Anesthesia: Monitor Anesthesia Care

## 2023-09-22 MED ORDER — GLYCOPYRROLATE 0.2 MG/ML IJ SOLN
INTRAMUSCULAR | Status: DC | PRN
Start: 2023-09-22 — End: 2023-09-22
  Administered 2023-09-22: .1 mg via INTRAVENOUS

## 2023-09-22 MED ORDER — PROPOFOL 10 MG/ML IV BOLUS
INTRAVENOUS | Status: DC | PRN
  Administered 2023-09-22: 100 ug/kg/min via INTRAVENOUS
  Administered 2023-09-22 (×2): 50 mg via INTRAVENOUS

## 2023-09-22 MED ORDER — SODIUM CHLORIDE 0.9 % IV SOLN: INTRAVENOUS | Status: DC

## 2023-09-22 MED ORDER — PHENYLEPHRINE HCL (PRESSORS) 10 MG/ML IV SOLN
INTRAVENOUS | Status: DC | PRN
  Administered 2023-09-22 (×2): 160 ug via INTRAVENOUS

## 2023-09-22 MED ORDER — LIDOCAINE 2% (20 MG/ML) 5 ML SYRINGE
INTRAMUSCULAR | Status: DC | PRN
  Administered 2023-09-22: 50 mg via INTRAVENOUS

## 2023-09-22 SURGICAL SUPPLY — 1 items: PAD DEFIB RADIO PHYSIO CONN (PAD) ×1 IMPLANT

## 2023-09-22 NOTE — Interval H&P Note (Signed)
 History and Physical Interval Note:  09/22/2023 8:31 AM  Christopher Green  has presented today for surgery, with the diagnosis of afib.  The various methods of treatment have been discussed with the patient and family. After consideration of risks, benefits and other options for treatment, the patient has consented to  Procedure(s): TRANSESOPHAGEAL ECHOCARDIOGRAM (N/A) CARDIOVERSION (N/A) as a surgical intervention.  The patient's history has been reviewed, patient examined, no change in status, stable for surgery.  I have reviewed the patient's chart and labs.  Questions were answered to the patient's satisfaction.     Christopher Green

## 2023-09-22 NOTE — Discharge Instructions (Signed)
Electrical Cardioversion Electrical cardioversion is the delivery of a jolt of electricity to restore a normal rhythm to the heart. A rhythm that is too fast or is not regular keeps the heart from pumping well. In this procedure, sticky patches or metal paddles are placed on the chest to deliver electricity to the heart from a device. This procedure may be done in an emergency if: There is low or no blood pressure as a result of the heart rhythm. Normal rhythm must be restored as fast as possible to protect the brain and heart from further damage. It may save a life. This may also be a scheduled procedure for irregular or fast heart rhythms that are not immediately life-threatening.  What can I expect after the procedure? Your blood pressure, heart rate, breathing rate, and blood oxygen level will be monitored until you leave the hospital or clinic. Your heart rhythm will be watched to make sure it does not change. You may have some redness on the skin where the shocks were given. Over the counter cortizone cream may be helpful.  Follow these instructions at home: Do not drive for 24 hours if you were given a sedative during your procedure. Take over-the-counter and prescription medicines only as told by your health care provider. Ask your health care provider how to check your pulse. Check it often. Rest for 48 hours after the procedure or as told by your health care provider. Avoid or limit your caffeine use as told by your health care provider. Keep all follow-up visits as told by your health care provider. This is important. Contact a health care provider if: You feel like your heart is beating too quickly or your pulse is not regular. You have a serious muscle cramp that does not go away. Get help right away if: You have discomfort in your chest. You are dizzy or you feel faint. You have trouble breathing or you are short of breath. Your speech is slurred. You have trouble moving an  arm or leg on one side of your body. Your fingers or toes turn cold or blue. Summary Electrical cardioversion is the delivery of a jolt of electricity to restore a normal rhythm to the heart. This procedure may be done right away in an emergency or may be a scheduled procedure if the condition is not an emergency. Generally, this is a safe procedure. After the procedure, check your pulse often as told by your health care provider. This information is not intended to replace advice given to you by your health care provider. Make sure you discuss any questions you have with your health care provider. Document Revised: 01/02/2019 Document Reviewed: 01/02/2019 Elsevier Patient Education  2020 Elsevier Inc. TEE  YOU HAD AN CARDIAC PROCEDURE TODAY: Refer to the procedure report and other information in the discharge instructions given to you for any specific questions about what was found during the examination. If this information does not answer your questions, please call [facility] office at [phone] to clarify.   DIET: Your first meal following the procedure should be a light meal and then it is ok to progress to your normal diet. A half-sandwich or bowl of soup is an example of a good first meal. Heavy or fried foods are harder to digest and may make you feel nauseous or bloated. Drink plenty of fluids but you should avoid alcoholic beverages for 24 hours. If you had a esophageal dilation, please see attached instructions for diet.   ACTIVITY: Your   care partner should take you home directly after the procedure. You should plan to take it easy, moving slowly for the rest of the day. You can resume normal activity the day after the procedure however YOU SHOULD NOT DRIVE, use power tools, machinery or perform tasks that involve climbing or major physical exertion for 24 hours (because of the sedation medicines used during the test).   SYMPTOMS TO REPORT IMMEDIATELY: A cardiologist can be  reached at any hour. Please call [phone] for any of the following symptoms:  Vomiting of blood or coffee ground material  New, significant abdominal pain  New, significant chest pain or pain under the shoulder blades  Painful or persistently difficult swallowing  New shortness of breath  Black, tarry-looking or red, bloody stools  FOLLOW UP:  Please also call with any specific questions about appointments or follow up tests.  

## 2023-09-22 NOTE — Transfer of Care (Signed)
 Immediate Anesthesia Transfer of Care Note  Patient: Christopher Green  Procedure(s) Performed: TRANSESOPHAGEAL ECHOCARDIOGRAM CARDIOVERSION  Patient Location: Cath Lab  Anesthesia Type:MAC  Level of Consciousness: awake and alert   Airway & Oxygen Therapy: Patient Spontanous Breathing  Post-op Assessment: Report given to RN and Post -op Vital signs reviewed and stable  Post vital signs: Reviewed and stable  Last Vitals:  Vitals Value Taken Time  BP 105/53 09/22/23 0904  Temp 36.1 C 09/22/23 0903  Pulse 59 09/22/23 0908  Resp 21 09/22/23 0908  SpO2 96 % 09/22/23 0908  Vitals shown include unfiled device data.  Last Pain:  Vitals:   09/22/23 0903  TempSrc: Tympanic  PainSc: 0-No pain         Complications: No notable events documented.

## 2023-09-22 NOTE — Anesthesia Postprocedure Evaluation (Signed)
 Anesthesia Post Note  Patient: Christopher Green  Procedure(s) Performed: TRANSESOPHAGEAL ECHOCARDIOGRAM CARDIOVERSION     Patient location during evaluation: PACU Anesthesia Type: MAC Level of consciousness: awake Pain management: pain level controlled Vital Signs Assessment: post-procedure vital signs reviewed and stable Respiratory status: spontaneous breathing, nonlabored ventilation and respiratory function stable Cardiovascular status: stable and blood pressure returned to baseline Postop Assessment: no apparent nausea or vomiting Anesthetic complications: no   No notable events documented.  Last Vitals:  Vitals:   09/22/23 0913 09/22/23 0923  BP: 100/61 127/66  Pulse: 65 63  Resp: 17 17  Temp:    SpO2: 98% 98%    Last Pain:  Vitals:   09/22/23 0923  TempSrc:   PainSc: 0-No pain                 Linton Rump

## 2023-09-22 NOTE — Progress Notes (Signed)
  Echocardiogram Echocardiogram Transesophageal has been performed.  Christopher Green 09/22/2023, 9:04 AM

## 2023-09-22 NOTE — CV Procedure (Signed)
   TRANSESOPHAGEAL ECHOCARDIOGRAM GUIDED DIRECT CURRENT CARDIOVERSION  NAME:  Christopher Green    MRN: [id number] DOB:  05/25/1971    ADMIT DATE: 09/22/2023  INDICATIONS: Symptomatic atrial fibrillation  PROCEDURE:   Informed consent was obtained prior to the procedure. The risks, benefits and alternatives for the procedure were discussed and the patient comprehended these risks.  Risks include, but are not limited to, cough, sore throat, vomiting, nausea, somnolence, esophageal and stomach trauma or perforation, bleeding, low blood pressure, aspiration, pneumonia, infection, trauma to the teeth and death.    After a procedural time-out, the oropharynx was anesthetized and the patient was sedated by the anesthesia service. The transesophageal probe was inserted in the esophagus and stomach without difficulty and multiple views were obtained. Sedation by anesthesia.   COMPLICATIONS:    Complications: No complications Patient tolerated procedure well.  KEY FINDINGS:  LV: EF 45% RV: normal functin TV: mild TR MV: trivial MR PV: trivial PI LAA: no thrombus Full Report to follow.   CARDIOVERSION:     Indications:  Symptomatic Atrial Fibrillation  Procedure Details:  Once the TEE was complete, the patient had the defibrillator pads placed in the anterior and posterior position. Once an appropriate level of sedation was confirmed, the patient was cardioverted x 1 with 300J of biphasic synchronized energy.  The patient converted to NSR.  There were no apparent complications.  The patient had normal neuro status and respiratory status post procedure with vitals stable as recorded elsewhere.  Adequate airway was maintained throughout and vital signs monitored per protocol.  Terrian Sentell Advanced Heart Failure 8:55 AM

## 2023-09-25 DIAGNOSIS — Z419 Encounter for procedure for purposes other than remedying health state, unspecified: Secondary | ICD-10-CM | POA: Diagnosis not present

## 2023-10-03 LAB — ACID FAST CULTURE WITH REFLEXED SENSITIVITIES (MYCOBACTERIA): Acid Fast Culture: NEGATIVE

## 2023-10-04 ENCOUNTER — Telehealth (HOSPITAL_COMMUNITY): Payer: Self-pay | Admitting: Cardiology

## 2023-10-04 ENCOUNTER — Ambulatory Visit: Payer: Self-pay

## 2023-10-04 NOTE — Telephone Encounter (Signed)
 Copied from CRM [id number]. Topic: Clinical - Medication Refill >> Oct 04, 2023 10:49 AM Annice Kim wrote: Most Recent Primary Care Visit:  Provider: Laneta Pintos  Department: [facility]  Visit Type: OFFICE VISIT  Date: 08/10/2023  Medication: testosterone  cypionate (DEPOTESTOSTERONE CYPIONATE) 200 MG/ML injection [[id number]]  Has the patient contacted their pharmacy? Yes (Agent: If no, request that the patient contact the pharmacy for the refill. If patient does not wish to contact the pharmacy document the reason why and proceed with request.) (Agent: If yes, when and what did the pharmacy advise?)  Is this the correct pharmacy for this prescription? Yes If no, delete pharmacy and type the correct one.  This is the patient's preferred pharmacy:  [facility] - [place], [place] - [address] [place] [address] Phone: [phone] Fax: [phone]    Has the prescription been filled recently? Yes  Is the patient out of the medication? Yes  Has the patient been seen for an appointment in the last year OR does the patient have an upcoming appointment? Yes  Can we respond through MyChart? Yes  Agent: Please be advised that Rx refills may take up to 3 business days. We ask that you follow-up with your pharmacy.

## 2023-10-04 NOTE — Telephone Encounter (Signed)
 Patient called to report he is having a hard time sleeping, reports he has not been asleep in 3 days.  Reports he does not want to take ambien requests an ok to start diazepam.   Advised detailed sleep management and prescribing details will come from PCP, however will get cardiac safe med advise from provider

## 2023-10-04 NOTE — Telephone Encounter (Signed)
Pt aware.

## 2023-10-04 NOTE — Telephone Encounter (Signed)
 Agree. Needs to set up follow up with PCP. Would also recommend avoiding caffeine intake.   Ameisha Mcclellan NP-C  3:37 PM

## 2023-10-05 ENCOUNTER — Other Ambulatory Visit: Payer: Self-pay | Admitting: Hematology and Oncology

## 2023-10-05 ENCOUNTER — Other Ambulatory Visit: Payer: Self-pay | Admitting: Family Medicine

## 2023-10-05 ENCOUNTER — Other Ambulatory Visit: Payer: Self-pay | Admitting: Cardiology

## 2023-10-05 ENCOUNTER — Telehealth: Payer: Self-pay | Admitting: *Deleted

## 2023-10-05 MED ORDER — RIVAROXABAN 20 MG PO TABS: 20.0000 mg | ORAL_TABLET | Freq: Every day | ORAL | Status: DC

## 2023-10-05 MED ORDER — TESTOSTERONE CYPIONATE 200 MG/ML IM SOLN: INTRAMUSCULAR | 2 refills | Status: DC

## 2023-10-05 NOTE — Telephone Encounter (Signed)
 Copied from CRM [id number]. Topic: General - Other >> Oct 05, 2023 11:37 AM Emylou G wrote: Reason for CRM: Christopher Green w/[facility] [phone] called and said they are no longer seeing him and to please take over refill: zarelto looking for a savings card and to refill his zarelto

## 2023-10-05 NOTE — Telephone Encounter (Signed)
 [facility] is requesting you to take over xarelto  Rx, we do not have any savings cards. Please advise.

## 2023-10-06 ENCOUNTER — Other Ambulatory Visit: Payer: Self-pay | Admitting: *Deleted

## 2023-10-06 MED ORDER — RIVAROXABAN 20 MG PO TABS
20.0000 mg | ORAL_TABLET | Freq: Every day | ORAL | 11 refills | Status: AC
Start: 2023-10-06 — End: 2024-10-06

## 2023-10-06 NOTE — Telephone Encounter (Signed)
 Resent this Rx due to the one yesterday not showing that it was received by pharmacy and I called and confirmed this with them and resent it

## 2023-10-06 NOTE — Telephone Encounter (Signed)
 Contacted pt and informed him of below and asked him if he wanted us  to reopen the referral that was placed in January.  He said no one ever reached out to him but the note said that he declined the service at the time.  I have contacted the referral coordinator to see about reopening that referral .

## 2023-10-06 NOTE — Telephone Encounter (Signed)
 I can certainly refill his Xarelto , but we do not have any physical savings cards.  It is something that can be filled out online by him at the Xarelto  website.  I have also sent in a referral to [facility] in t January and they can help with this process.  I am not sure if he ever scheduled an appointment with them.  He also sees a cardiologist regularly and cardiologist offices typically have either the coupon cards or samples.  It also looks like he has Medicaid as secondary coverage.  If he has Medicaid, Medicaid should cover this medication.

## 2023-10-07 ENCOUNTER — Other Ambulatory Visit: Payer: Self-pay

## 2023-10-07 ENCOUNTER — Telehealth: Payer: Self-pay

## 2023-10-07 NOTE — Progress Notes (Signed)
   10/07/2023  Patient ID: Christopher Green, male   DOB: [birth year], 53 y.o.   MRN: [id number]    10/07/2023 Name: Christopher Green MRN: [id number] DOB: [birth year]  Chief Complaint  Patient presents with   Medication Management    Christopher Green is a 53 y.o. year old male who presented for a telephone visit.   They were referred to the pharmacist by their PCP for assistance in managing medication access.    Subjective:  Care Team: Primary Care Provider: Laneta Pintos, MD ; Next Scheduled Visit:  Future Appointments  Date Time Provider Department Center  10/25/2023 10:20 AM Alwin Baars, DO [facility] None  11/09/2023  9:10 AM Laneta Pintos, MD [facility] None  11/16/2023  7:00 AM [facility] DEVICE REMOTES [facility]  02/15/2024  7:00 AM [facility] DEVICE REMOTES [facility]  05/16/2024  7:00 AM [facility] DEVICE REMOTES [facility]  08/15/2024  7:00 AM [facility] DEVICE REMOTES [facility]     Medication Access/Adherence  Current Pharmacy:  [facility] - [place], [place] - [address] [place] [address] Phone: [phone] Fax: [phone]  [id number] - [address] [place] - [address] [place] [address] Phone: [phone] Fax: [phone]  [id number] - [place], [place] - [address] [place] [address] Phone: [phone] Fax: [phone]   Patient reports affordability concerns with their medications: Yes  Patient reports access/transportation concerns to their pharmacy:  Patient reports adherence concerns with their medications:      Medication Management:  Current adherence strategy:   Patient reports Good adherence to medications  Patient reports the following barriers to adherence: none other than cost and hopefully that is resolved.   Recent fill dates:    Objective:  Lab Results  Component  Value Date   HGBA1C 7.6 08/10/2023    Lab Results  Component Value Date   CREATININE 1.07 09/03/2023   BUN 18 09/03/2023   NA 138 09/03/2023   K 4.5 09/03/2023   CL 102 09/03/2023   CO2 29 09/03/2023    Lab Results  Component Value Date   CHOL 141 07/12/2023   HDL 31 (L) 07/12/2023   LDLCALC 43 07/12/2023   TRIG 334 (H) 07/12/2023   CHOLHDL 4.5 07/12/2023    Medications Reviewed Today   Medications were not reviewed in this encounter       Assessment/Plan:   -I contacted pharmacy - had Jardiance  and Xarelto  processed, no copay for either, the confusion leading up to the referral might have been the need for PA however that was approved. Patient states he will have wife call with any additional pharmacy related concerns.   Follow Up Plan: happy to assist PRN, no scheduled f/u at this time given Xarelto  going through at no cost.   Rolando Cliche, PharmD, [facility] Clinical Pharmacist  [phone]

## 2023-10-07 NOTE — Progress Notes (Signed)
 Care Guide Pharmacy Note  10/07/2023 Name: Christopher Green MRN: [id number] DOB: [birth year]  Referred By: Laneta Pintos, MD Reason for referral: Complex Care Management (Outreach to schedule with Pharm d )   Christopher Green is a 53 y.o. year old male who is a primary care patient of Laneta Pintos, MD.  [name] was referred to the pharmacist for assistance related to: DMII  Successful contact was made with the patient to discuss pharmacy services including being ready for the pharmacist to call at least 5 minutes before the scheduled appointment time and to have medication bottles and any blood pressure readings ready for review. The patient agreed to meet with the pharmacist via telephone visit on (date/time).10/07/2023  Lenton Rail , RMA     [facility], [facility] Guide  Direct Dial: [phone]  Website: [email or web address]

## 2023-10-11 LAB — ECHO TEE

## 2023-10-22 ENCOUNTER — Other Ambulatory Visit: Payer: Self-pay | Admitting: Cardiology

## 2023-10-25 ENCOUNTER — Ambulatory Visit (HOSPITAL_COMMUNITY)
Admission: RE | Admit: 2023-10-25 | Discharge: 2023-10-25 | Disposition: A | Discharge status: Home / Self Care | Source: Ambulatory Visit | Attending: Adult Health | Admitting: Adult Health

## 2023-10-25 ENCOUNTER — Encounter (HOSPITAL_COMMUNITY): Payer: Self-pay | Admitting: Cardiology

## 2023-10-25 VITALS — BP 120/89 | HR 61 | Wt 300.0 lb

## 2023-10-25 DIAGNOSIS — I4892 Unspecified atrial flutter: Secondary | ICD-10-CM | POA: Insufficient documentation

## 2023-10-25 DIAGNOSIS — Z7901 Long term (current) use of anticoagulants: Secondary | ICD-10-CM | POA: Insufficient documentation

## 2023-10-25 DIAGNOSIS — Z86711 Personal history of pulmonary embolism: Secondary | ICD-10-CM | POA: Diagnosis not present

## 2023-10-25 DIAGNOSIS — Z79899 Other long term (current) drug therapy: Secondary | ICD-10-CM | POA: Insufficient documentation

## 2023-10-25 DIAGNOSIS — Z6839 Body mass index (BMI) 39.0-39.9, adult: Secondary | ICD-10-CM | POA: Insufficient documentation

## 2023-10-25 DIAGNOSIS — E669 Obesity, unspecified: Secondary | ICD-10-CM | POA: Insufficient documentation

## 2023-10-25 DIAGNOSIS — Z8249 Family history of ischemic heart disease and other diseases of the circulatory system: Secondary | ICD-10-CM | POA: Insufficient documentation

## 2023-10-25 DIAGNOSIS — I2510 Atherosclerotic heart disease of native coronary artery without angina pectoris: Secondary | ICD-10-CM | POA: Diagnosis not present

## 2023-10-25 DIAGNOSIS — I110 Hypertensive heart disease with heart failure: Secondary | ICD-10-CM | POA: Diagnosis not present

## 2023-10-25 DIAGNOSIS — Z950 Presence of cardiac pacemaker: Secondary | ICD-10-CM | POA: Diagnosis not present

## 2023-10-25 DIAGNOSIS — E119 Type 2 diabetes mellitus without complications: Secondary | ICD-10-CM | POA: Diagnosis not present

## 2023-10-25 DIAGNOSIS — E785 Hyperlipidemia, unspecified: Secondary | ICD-10-CM | POA: Diagnosis not present

## 2023-10-25 DIAGNOSIS — Z720 Tobacco use: Secondary | ICD-10-CM

## 2023-10-25 DIAGNOSIS — Z419 Encounter for procedure for purposes other than remedying health state, unspecified: Secondary | ICD-10-CM | POA: Diagnosis not present

## 2023-10-25 DIAGNOSIS — Z86718 Personal history of other venous thrombosis and embolism: Secondary | ICD-10-CM | POA: Diagnosis not present

## 2023-10-25 DIAGNOSIS — I4819 Other persistent atrial fibrillation: Secondary | ICD-10-CM

## 2023-10-25 DIAGNOSIS — F17210 Nicotine dependence, cigarettes, uncomplicated: Secondary | ICD-10-CM | POA: Diagnosis not present

## 2023-10-25 DIAGNOSIS — M549 Dorsalgia, unspecified: Secondary | ICD-10-CM | POA: Insufficient documentation

## 2023-10-25 DIAGNOSIS — I428 Other cardiomyopathies: Secondary | ICD-10-CM | POA: Diagnosis not present

## 2023-10-25 DIAGNOSIS — Z7984 Long term (current) use of oral hypoglycemic drugs: Secondary | ICD-10-CM | POA: Diagnosis not present

## 2023-10-25 DIAGNOSIS — Z8679 Personal history of other diseases of the circulatory system: Secondary | ICD-10-CM | POA: Diagnosis not present

## 2023-10-25 DIAGNOSIS — I5022 Chronic systolic (congestive) heart failure: Secondary | ICD-10-CM | POA: Insufficient documentation

## 2023-10-25 DIAGNOSIS — I480 Paroxysmal atrial fibrillation: Secondary | ICD-10-CM | POA: Diagnosis not present

## 2023-10-25 DIAGNOSIS — I517 Cardiomegaly: Secondary | ICD-10-CM

## 2023-10-25 LAB — HEPATIC FUNCTION PANEL
ALT: 31 U/L (ref 0–44)
AST: 27 U/L (ref 15–41)
Albumin: 3.5 g/dL (ref 3.5–5.0)
Alkaline Phosphatase: 46 U/L (ref 38–126)
Bilirubin, Direct: 0.1 mg/dL (ref 0.0–0.2)
Indirect Bilirubin: 0.4 mg/dL (ref 0.3–0.9)
Total Bilirubin: 0.5 mg/dL (ref 0.0–1.2)
Total Protein: 6.2 g/dL — ABNORMAL LOW (ref 6.5–8.1)

## 2023-10-25 LAB — TSH: TSH: 1.821 u[IU]/mL (ref 0.350–4.500)

## 2023-10-25 MED ORDER — AMIODARONE HCL 200 MG PO TABS: 200.0000 mg | ORAL_TABLET | Freq: Every day | ORAL | 3 refills | Status: DC

## 2023-10-25 MED ORDER — SPIRONOLACTONE 25 MG PO TABS: 12.5000 mg | ORAL_TABLET | Freq: Every day | ORAL | 1 refills | Status: AC

## 2023-10-25 NOTE — Patient Instructions (Signed)
 Medication Changes:  DECREASE AMIODARONE  TO 200MG  ONCE DAILY (CAN TAKE 1/2 TABLET) UNTIL TIME TO PICK UP NEW DOSAGE   TAKE SPIRONOLACTONE  DAILY AT BEDTIME   Lab Work:  Labs done today, your results will be available in MyChart, we will contact you for abnormal readings.  Follow-Up in: 2 MONTHS AS SCHEDULED   At the [facility], you and your health needs are our priority. We have a designated team specialized in the treatment of Heart Failure. This Care Team includes your primary Heart Failure Specialized Cardiologist (physician), Advanced Practice Providers (APPs- Physician Assistants and Nurse Practitioners), and Pharmacist who all work together to provide you with the care you need, when you need it.   You may see any of the following providers on your designated Care Team at your next follow up:  Dr. Jules Oar Dr. Peder Bourdon Dr. Alwin Baars Dr. Judyth Nunnery Nieves Bars, NP Ruddy Corral, [place] [facility] [place], [place] Dennise Fitz, NP [place] Lee, NP Luster Salters, PharmD   Please be sure to bring in all your medications bottles to every appointment.   Need to Contact Us :  If you have any questions or concerns before your next appointment please send us  a message through [place] or call our office at [phone].    TO LEAVE A MESSAGE FOR THE NURSE SELECT OPTION 2, PLEASE LEAVE A MESSAGE INCLUDING: YOUR NAME DATE OF BIRTH CALL BACK NUMBER REASON FOR CALL**this is important as we prioritize the call backs  YOU WILL RECEIVE A CALL BACK THE SAME DAY AS LONG AS YOU CALL BEFORE 4:00 PM

## 2023-10-25 NOTE — Progress Notes (Signed)
 [facility] CL[facility] NOTE  Referring Physician: Laneta Pintos, MD  Primary Care: Christopher Pintos, MD Primary Cardiologist: Dr. Emmette Green HF: Dr. Bruce Green  CC: Heart Failure HPI: Christopher Green is a 53 y.o. 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 first established care with [facility] in October 2019 when he had an abnormal nuclear stress test followed by left heart catheterization in 03/2018 with reportedly minimal plaque.  During that time he was also seen for uncontrolled hypertension in the emergency department.  He was not seen by [facility] cardiology until December 2023 when he presented to [facility] with complaints of shortness of breath and a systolic blood pressure over 200 and heart rate of 40.  CTA PE on admission was significant for acute segmental and subsegmental pulmonary emboli.  While admitted patient was also significantly bradycardic with Mobitz 2 heart block.  Echocardiogram during admission with LVEF of 65 to 70%.  Due to persistent symptoms of dyspnea and 2-1 AV block with right bundle branch block on EKG he underwent placement of permanent pacemaker by Dr. Pete Green in 2024.  Despite placement of permanent pacemaker he had multiple episodes of syncope in August 2024.  Device interrogation by EP with atrial fibrillation with controlled ventricular rates and atrial flutter.  During this time he also had intermittent low blood pressures that were associated with his syncope but etiology could not be determined.  He subsequently underwent cardiac MRI on 10/24 which demonstrated severe LVH of the septum with multifocal LGE that was not present in the areas of hypertrophy.  Patient subsequently sent to heart failure clinic for further evaluation.  He reports that his father passed in  his 72s from a 'heart attack' but did not undergo LHC; he did have an autopsy.  We are trying to obtain records from his autopsy. His grandfather passed from a similar condition.   HRCT notable for hilar adenopathy. EBUS on 08/16/23 negative for granulomas or signs of mediastinal sarcoidosis.   Today he returns for HF follow up.Overall feeling fine. Occasional SOB with exertion. Functionally limited by back pain. Denies PND/Orthopnea. Having difficuly sleeping. Appetite ok. Intolerant ozempic  due to nausea. No fever or chills.  Taking all medications. Smoking 1 PPD. Not interested inquiting.He did stop drinking alcohol.   Interval hx: -S/P TEE- DC-CV with conversion to SR.   - Underwent EBUS on 08/16/23; biopsies negative for sarcoid.  - Genetic panel positive for MYH6 -  Christopher Green AHI 12.9  Mild OSA- Recommendations to start Apnea treatment with CPAP.   Current Outpatient Medications  Medication Sig Dispense Refill   albuterol  (VENTOLIN  HFA) 108 (90 Base) MCG/ACT inhaler Inhale 2 puffs into the lungs every 6 (six) hours as needed for wheezing or shortness of breath. 8 g 0   atorvastatin  (LIPITOR) 40 MG tablet TAKE 1 TABLET (40 MG TOTAL) BY MOUTH DAILY. 90 tablet 1   carvedilol  (COREG ) 3.125 MG tablet TAKE 1 TABLET BY MOUTH 2 TIMES DAILY WITH A MEAL. 60 tablet 4   cetirizine  (ZYRTEC ) 10 MG tablet Take 1 tablet (10 mg total) by mouth daily. 30 tablet 11   empagliflozin  (JARDIANCE ) 10 MG TABS tablet Take 1 tablet (10 mg total) by mouth daily before breakfast. 30 tablet 6   icosapent  Ethyl (VASCEPA ) 1 g capsule Take 2 capsules (2 g total) by  mouth 2 (two) times daily. 120 capsule 6   metFORMIN  (GLUCOPHAGE -XR) 750 MG 24 hr tablet Take 2 tablets (1,500 mg total) by mouth daily. (Patient taking differently: Take 750 mg by mouth in the morning and at bedtime.) 180 tablet 1   oxyCODONE -acetaminophen  (PERCOCET) 10-325 MG tablet 1 tablet every 4-6 hours prn for pain 150 tablet 0   potassium chloride  SA  (KLOR-CON  M) 20 MEQ tablet Take 1 tablet (20 mEq total) by mouth 2 (two) times daily. 33 tablet 6   pregabalin  (LYRICA ) 300 MG capsule Take 300 mg by mouth daily as needed (pain).     rivaroxaban  (XARELTO ) 20 MG TABS tablet Take 1 tablet (20 mg total) by mouth daily with supper. 30 tablet 11   sacubitril-valsartan  (ENTRESTO ) 24-26 MG Take 1 tablet by mouth 2 (two) times daily. 60 tablet 6   sildenafil  (VIAGRA ) 100 MG tablet Take 0.5-1 tablets (50-100 mg total) by mouth daily as needed for erectile dysfunction. 10 tablet 11   testosterone  cypionate (DEPOTESTOSTERONE CYPIONATE) 200 MG/ML injection INJECT 0.5 ML INTO THE MUSCLE EVERY 14 DAYS. 10 mL 2   torsemide  (DEMADEX ) 20 MG tablet Take 1 tablet (20 mg total) by mouth every other day.     amiodarone  (PACERONE ) 200 MG tablet Take 1 tablet (200 mg total) by mouth daily. 90 tablet 3   DULoxetine  (CYMBALTA ) 30 MG capsule Take 1 capsule (30 mg total) by mouth as needed (nerve pain). (Patient not taking: Reported on 10/25/2023)     spironolactone  (ALDACTONE ) 25 MG tablet Take 0.5 tablets (12.5 mg total) by mouth at bedtime. 45 tablet 1   No current facility-administered medications for this encounter.    No Known Allergies  PHYSICAL EXAM: Vitals:   10/25/23 1025  BP: 120/89  Pulse: 61  SpO2: 96%   Wt Readings from Last 3 Encounters:  10/25/23 136.1 kg (300 lb)  09/22/23 135.6 kg (299 lb)  09/03/23 (!) 136.8 kg (301 lb 9.6 oz)    General:   No resp difficulty Neck: supple. no JVD.  Cor: PMI nondisplaced. Regular rate & rhythm. No rubs, gallops or murmurs. Lungs: clear Abdomen: soft, nontender, nondistended.  Extremities: no cyanosis, clubbing, rash, edema Neuro: alert & oriented x3  EKG: A Sensed V paced. SR 64 bpm  DATA REVIEW  ECG: 05/28/23: Atrial flutter with intermittent pacing as per my personal interpretation  ECHO: 02/12/23: LVEF 60 to 65% with normal RV function.  Severe LVH as per Dr Christopher Green.   CMR (04/12/23): 1.  Mild decrease in LVEF, LVEF 46%, in the setting of free breathing artifact.  2. There is severe hypertrophy of the LV septum. Though this meets criteria for hypertrophic cardiomyopathy, this is multi-focal late gadolinium enhancement with increase in ECV signal; this is not present in the areas of hypertrophy. Consider PET imaging for inflammatory cardiomyopathy evaluation.  Cardiac PET (05/19/23):   FDG uptake findings are consistent with active myocardial inflammation/sarcoidosis.   FDG uptake was observed. FDG uptake was focal. FDG uptake was present in the apical to basal lateral, apical anterior and apex segment(s). LV perfusion is abnormal. Defect 1: There is a small defect with mild reduction in uptake present in the apical inferior location(s).   Left ventricular function is abnormal. Global function is moderately reduced. EF: 36%.   Coronary calcium  was present on the attenuation correction CT images. Moderate coronary calcifications were present. Coronary calcifications were present in the left anterior descending artery, left circumflex artery and right coronary artery distribution(s).  Electronically signed by Carson Clara, MD  ASSESSMENT & PLAN:  Heart failure with mildly reduced EF, Evaluation for infiltrative cardiomyopathy Etiology of HF: Nonischemic cardiomyopathy with significant LVH.  Although he does not have the typical characteristics of cardiac sarcoid he does have patchy LGE on cardiac MRI.  Interestingly he has severe LVH that would be more consistent with hypertrophic cardiomyopathy.  Genetic panel to rule out HCM. Cardiac PET consistent with sarcoid. He has now had a HRCT which has some hilar adenopathy. EBUS negative for sarcoid.  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  NYHA class / AHA Stage: NYHA III Volume status & Diuretics: Appears  euvolemic. Continue torsemide  every other day  Vasodilators: Entresto  24/26mg  BID; SBP at goal.  Beta-Blocker: Coreg  3.125 mg twice daily MRA: Spironolactone  12.5 mg daily. Switch to bedtime.  Cardiometabolic: Jardiance  10 mg daily Devices therapies & Valvulopathies: Dual-chamber permanent pacemaker placed by Dr. Carolynne Citron for advanced AV block. Will need to upgrade device to ICD.  Advanced therapies: Not indicated  2. Cardiac sarcoid - Reviewed pulmonology notes from 08/26/23; EBUS on 08/16/23 negative for sarcoid.  - Quantiferon-gold negative - Will review vaccination record -PET consistent with sarcoid. EBUS negative -Genetic panel concerning for HCM. Will discuss with imaging colleagues. + MYH6.  - Will need to refer to Dr Drexel Gentles for genetic counseling.   3. PAF -Noted to be in persistent atrial fibrillation since March 2024 09/2023 S/P TEE DC-CV with conversion to SR. Cut back amio 200 mg daily. - In SR today.  -Continue Xarelto  20 mg daily -Followed by Dr. Carolynne Citron -Continue Coreg  3.125 mg twice daily - Cut back amio 200 mg daily.  Patient is on amiodarone .  - Plan to follow amiodarone  screening per guidelines  -Check TSH, LFTS today.  - Needs yearly eye exams and PFTs.  We discussed today.  - Waiting to start CPAP.   4. Coronary artery disease -Nonobstructive mild CAD per left heart cath in 2019. -No chest pain.   5. Hyperlipidemia -Continue Lipitor 40 mg daily  6. History of PE/DVT and antiphospholipid antibody syndrome -Will require lifelong anticoagulation with Xarelto  20 mg daily.  7. Severe back pain - Followed by NSGY - Based on chart review from 07/26/23, saw Christopher Green; underwent C7-T1 injection for pain control.  - Remains limited by back pain.   8. Obesity  Body mass index is 39.58 kg/m. He has tried Ozempic  but had nausea so he stopped. He does not want to re challenge.   9. Tobacco Abuse Smokes 1 PPD. Discussed cessation. He does not want to quit.      Follow up in 2 months with Dr Christopher Green.   Christopher Green Aylanie Cubillos NP-C  11:09 AM

## 2023-10-25 NOTE — Addendum Note (Signed)
 Encounter addended by: Ziquan Fidel B, RN on: 10/25/2023 11:42 AM  Actions taken: Order list changed, Diagnosis association updated

## 2023-11-09 ENCOUNTER — Ambulatory Visit: Payer: BC Managed Care – PPO | Admitting: Family Medicine

## 2023-11-16 ENCOUNTER — Ambulatory Visit (INDEPENDENT_AMBULATORY_CARE_PROVIDER_SITE_OTHER): Payer: BC Managed Care – PPO

## 2023-11-16 DIAGNOSIS — I4430 Unspecified atrioventricular block: Secondary | ICD-10-CM | POA: Diagnosis not present

## 2023-11-16 LAB — CUP PACEART REMOTE DEVICE CHECK
Battery Remaining Longevity: 84 mo
Battery Remaining Percentage: 89 %
Battery Voltage: 2.98 V
Brady Statistic AP VP Percent: 53 %
Brady Statistic AP VS Percent: 8.4 %
Brady Statistic AS VP Percent: 37 %
Brady Statistic AS VS Percent: 1.1 %
Brady Statistic RA Percent Paced: 58 %
Brady Statistic RV Percent Paced: 89 %
Date Time Interrogation Session: 20250603040013
Implantable Lead Connection Status: 753985
Implantable Lead Connection Status: 753985
Implantable Lead Implant Date: 20240304
Implantable Lead Implant Date: 20240304
Implantable Lead Location: 753859
Implantable Lead Location: 753860
Implantable Pulse Generator Implant Date: 20240304
Lead Channel Impedance Value: 430 Ohm
Lead Channel Impedance Value: 430 Ohm
Lead Channel Pacing Threshold Amplitude: 1 V
Lead Channel Pacing Threshold Amplitude: 1.25 V
Lead Channel Pacing Threshold Pulse Width: 0.5 ms
Lead Channel Pacing Threshold Pulse Width: 0.5 ms
Lead Channel Sensing Intrinsic Amplitude: 0.4 mV
Lead Channel Sensing Intrinsic Amplitude: 12 mV
Lead Channel Setting Pacing Amplitude: 2.5 V
Lead Channel Setting Pacing Amplitude: 2.5 V
Lead Channel Setting Pacing Pulse Width: 0.5 ms
Lead Channel Setting Sensing Sensitivity: 2 mV
Pulse Gen Model: 2272
Pulse Gen Serial Number: 8153879

## 2023-11-18 ENCOUNTER — Ambulatory Visit: Payer: Self-pay | Admitting: Internal Medicine

## 2023-11-19 ENCOUNTER — Other Ambulatory Visit (HOSPITAL_COMMUNITY): Payer: Self-pay | Admitting: Family Medicine

## 2023-11-22 ENCOUNTER — Telehealth: Payer: Self-pay

## 2023-11-22 NOTE — Telephone Encounter (Signed)
 Called patient to find out if he has received PAP device. Notified patient that [facility] has been trying to reach him, patient stated he is scheduled to pickup device and supplies on Wed November 24, 2023. Notified patient that LN, Sleep Clinic scheduler, will contact patient for compliance visit in 6 weeks.

## 2023-11-22 NOTE — Telephone Encounter (Signed)
-----   Message from Nurse Daria Eddy B sent at 10/25/2023 10:51 AM EDT ----- Regarding: CPAP Hi- this patient was seen today in HF clinic- he is still waiting on CPAP- could someone check on this and let patient know where in the process he is?    Thank you so much  -Jenna B

## 2023-11-24 ENCOUNTER — Encounter (HOSPITAL_COMMUNITY): Payer: Self-pay | Admitting: Cardiology

## 2023-11-25 DIAGNOSIS — Z419 Encounter for procedure for purposes other than remedying health state, unspecified: Secondary | ICD-10-CM | POA: Diagnosis not present

## 2023-12-03 ENCOUNTER — Other Ambulatory Visit: Payer: Self-pay | Admitting: Family Medicine

## 2023-12-15 ENCOUNTER — Ambulatory Visit (INDEPENDENT_AMBULATORY_CARE_PROVIDER_SITE_OTHER): Admitting: Physician Assistant

## 2023-12-15 ENCOUNTER — Ambulatory Visit
Admission: RE | Admit: 2023-12-15 | Discharge: 2023-12-15 | Disposition: A | Discharge status: Home / Self Care | Attending: Physician Assistant | Admitting: Physician Assistant

## 2023-12-15 ENCOUNTER — Encounter: Payer: Self-pay | Admitting: Physician Assistant

## 2023-12-15 ENCOUNTER — Ambulatory Visit
Admission: RE | Admit: 2023-12-15 | Discharge: 2023-12-15 | Disposition: A | Discharge status: Home / Self Care | Source: Ambulatory Visit | Attending: Physician Assistant | Admitting: Physician Assistant

## 2023-12-15 VITALS — BP 120/70 | Ht 73.0 in | Wt 289.0 lb

## 2023-12-15 DIAGNOSIS — I700 Atherosclerosis of aorta: Secondary | ICD-10-CM | POA: Diagnosis not present

## 2023-12-15 DIAGNOSIS — M51369 Other intervertebral disc degeneration, lumbar region without mention of lumbar back pain or lower extremity pain: Secondary | ICD-10-CM | POA: Insufficient documentation

## 2023-12-15 DIAGNOSIS — W19XXXD Unspecified fall, subsequent encounter: Secondary | ICD-10-CM

## 2023-12-15 DIAGNOSIS — W19XXXA Unspecified fall, initial encounter: Secondary | ICD-10-CM

## 2023-12-15 DIAGNOSIS — M5136 Other intervertebral disc degeneration, lumbar region with discogenic back pain only: Secondary | ICD-10-CM | POA: Insufficient documentation

## 2023-12-15 DIAGNOSIS — M48062 Spinal stenosis, lumbar region with neurogenic claudication: Secondary | ICD-10-CM | POA: Diagnosis not present

## 2023-12-15 DIAGNOSIS — M4316 Spondylolisthesis, lumbar region: Secondary | ICD-10-CM | POA: Diagnosis not present

## 2023-12-15 DIAGNOSIS — M549 Dorsalgia, unspecified: Secondary | ICD-10-CM | POA: Diagnosis not present

## 2023-12-15 MED ORDER — METHYLPREDNISOLONE 4 MG PO TBPK: ORAL_TABLET | ORAL | 0 refills | Status: DC

## 2023-12-15 MED ORDER — TIZANIDINE HCL 4 MG PO TABS: 4.0000 mg | ORAL_TABLET | Freq: Three times a day (TID) | ORAL | 1 refills | Status: AC

## 2023-12-15 NOTE — Progress Notes (Signed)
 Referring Physician:  Chandra Toribio POUR, MD [address] [place],  [place] [address]  Primary Physician:  Chandra Toribio POUR, MD  History of Present Illness: [name] has a history of HTN, atrial flutter, venous insufficiency, DM, peripheral polyneuropathy, hyperlipidemia, obesity, and chronic pain.  He has a pacemaker.  He is currently having acute on chronic pain after a fall 2 weeks ago trying to get off his boat and when she fell directly on his back.  He states he is having increased numbness in bilateral lower extremities.  He states that his legs feel very heavy and he can only walk about 100 feet which is a change since the fall and as soon as he starts walking his legs go numb.  He has some relief while leaning forward, but he is in quite severe pain despite Lyrica  and oxycodone .  No radiating shooting pain down his legs.  One  leg is not worse than the other.   Bowel/Bladder Dysfunction: none  Conservative measures:  Physical therapy: None Multimodal medical therapy including regular antiinflammatories: Oxycodone , Gabapentin , Tylenol , Ibuprofen, cymbalta   Injections:  Has done ESIs in past with relief  Past Surgery: no  Christopher Green has no symptoms of cervical myelopathy. He has bilateral carpal tunnel, had surgery on the left.   The symptoms are causing a significant impact on the patient's life.   Review of Systems:  A 10 point review of systems is negative, except for the pertinent positives and negatives detailed in the HPI.  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   Hypercoagulable state (HCC)    elevated factor VIII, persistent + lupus anticoagulant    Hyperlipidemia    Hypertension    Obesity    PE (pulmonary thromboembolism) (HCC) 05/25/2022   Peripheral polyneuropathy    Presence of permanent cardiac pacemaker    Tobacco abuse    Venous insufficiency     Past Surgical History: Past Surgical History:  Procedure Laterality Date   BRONCHIAL WASHINGS  08/16/2023   Procedure: IRRIGATION, BRONCHUS;  Surgeon: Shelah Lamar RAMAN, MD;  Location: [facility] ENDOSCOPY;  Service: Pulmonary;;   CARDIAC CATHETERIZATION  03/25/2018   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, mLAD, DIAG, pCX, pRCA)   CARDIOVERSION N/A 09/22/2023   Procedure: CARDIOVERSION;  Surgeon: Gardenia Led, DO;  Location: [facility] INVASIVE CV LAB;  Service: Cardiovascular;  Laterality: N/A;   CARPAL TUNNEL RELEASE Left    FINE NEEDLE ASPIRATION  08/16/2023   Procedure: FINE NEEDLE ASPIRATION (FNA) LINEAR;  Surgeon: Shelah Lamar RAMAN, MD;  Location[facility] ENDOSCOPY;  Service: Pulmonary;;   PACEMAKER IMPLANT N/A 08/17/2022   Procedure: PACEMAKER IMPLANT;  Surgeon: Waddell Danelle ORN, MD;  Location: [facility] INVASIVE CV LAB;  Service: Cardiovascular;  Laterality: N/A;   TRANSESOPHAGEAL ECHOCARDIOGRAM (CATH LAB) N/A 09/22/2023   Procedure: TRANSESOPHAGEAL ECHOCARDIOGRAM;  Surgeon: Gardenia Led, DO;  Location: [facility] CV [facility];  Service: Cardiovascular;  Laterality: N/A;   VIDEO BRONCHOSCOPY WITH ENDOBRONCHIAL ULTRASOUND N/A 08/16/2023   Procedure: BRONCHOSCOPY, WITH EBUS;  Surgeon: Shelah Lamar RAMAN, MD;  Location: [facility] ENDOSCOPY;  Service: Pulmonary;  Laterality: N/A;    Allergies: Allergies  as of 12/15/2023   (No Known Allergies)    Medications: Outpatient Encounter Medications as of 12/15/2023  Medication Sig   albuterol  (VENTOLIN  HFA) 108 (90 Base) MCG/ACT inhaler Inhale 2 puffs into the lungs every 6 (six) hours as needed for wheezing or shortness of breath.   amiodarone  (PACERONE ) 200 MG tablet Take 1 tablet (200 mg total) by mouth daily.   atorvastatin  (LIPITOR) 40 MG tablet TAKE 1 TABLET (40 MG TOTAL) BY MOUTH DAILY.    carvedilol  (COREG ) 3.125 MG tablet TAKE 1 TABLET BY MOUTH 2 TIMES DAILY WITH A MEAL.   cetirizine  (ZYRTEC ) 10 MG tablet Take 1 tablet (10 mg total) by mouth daily.   DULoxetine  (CYMBALTA ) 30 MG capsule Take 1 capsule (30 mg total) by mouth as needed (nerve pain).   empagliflozin  (JARDIANCE ) 10 MG TABS tablet Take 1 tablet (10 mg total) by mouth daily before breakfast.   icosapent  Ethyl (VASCEPA ) 1 g capsule Take 2 capsules (2 g total) by mouth 2 (two) times daily.   metFORMIN  (GLUCOPHAGE -XR) 750 MG 24 hr tablet Take 2 tablets (1,500 mg total) by mouth daily. (Patient taking differently: Take 750 mg by mouth in the morning and at bedtime.)   oxyCODONE -acetaminophen  (PERCOCET) 10-325 MG tablet TAKE 1 TABLET BY MOUTH EVERY 4 TO 6 HOURS AS NEEDED FOR PAIN FILL 11/02/2023   potassium chloride  SA (KLOR-CON  M) 20 MEQ tablet TAKE 1 TABLET BY MOUTH 2 TIMES DAILY.   pregabalin  (LYRICA ) 300 MG capsule Take 300 mg by mouth daily as needed (pain).   rivaroxaban  (XARELTO ) 20 MG TABS tablet Take 1 tablet (20 mg total) by mouth daily with supper.   sacubitril-valsartan  (ENTRESTO ) 24-26 MG Take 1 tablet by mouth 2 (two) times daily.   sildenafil  (VIAGRA ) 100 MG tablet Take 0.5-1 tablets (50-100 mg total) by mouth daily as needed for erectile dysfunction.   spironolactone  (ALDACTONE ) 25 MG tablet Take 0.5 tablets (12.5 mg total) by mouth at bedtime.   testosterone  cypionate (DEPOTESTOSTERONE CYPIONATE) 200 MG/ML injection INJECT 0.5 ML INTO THE MUSCLE EVERY 14 DAYS.   torsemide  (DEMADEX ) 20 MG tablet Take 1 tablet (20 mg total) by mouth every other day.   No facility-administered encounter medications on file as of 12/15/2023.    Social History: Social History   Tobacco Use   Smoking status: Every Day    Types: Cigarettes    Passive exposure: Current   Smokeless tobacco: Never   Tobacco comments:    1ppd 08/26/23  Vaping Use   Vaping status: Never Used  Substance Use Topics   Alcohol use: Yes     Comment: daily   Drug use: Never    1 pack per day  Family Medical History: Family History  Problem Relation Age of Onset   Heart attack Father     Physical Examination: Vitals:   12/15/23 1117  BP: 120/70      Awake, alert, oriented to person, place, and time.  Speech is clear and fluent. Fund of knowledge is appropriate.   Cranial Nerves: Pupils equal round and reactive to light.  Facial tone is symmetric.    No posterior cervical or bilateral trapezial tenderness.   Mild lower posterior lumbar tenderness.     Strength:  Side Iliopsoas Quads Hamstring PF DF EHL  R 5 5 5 5 5 5   L 5 5 5 5 5 5    Reflexes are 1+ and symmetric at the  patella and achilles.     Clonus is not present.  Decreased sensation in bilateral lower extremities. Gait is abnormal- he limps.   Medical Decision Making  Imaging: MRI lumbar spine dated 04/12/23:  FINDINGS: Segmentation: Conventional numbering is assumed with 5 non-rib-bearing, lumbar type vertebral bodies.   Alignment:  Normal.   Vertebrae: Modic type 2 degenerative endplate marrow signal changes at L3-4 and L4-5. 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   Conus medullaris and cauda equina: Conus extends to the L1 level. Conus and cauda equina appear normal.   Paraspinal and other soft tissues: Unremarkable.   Disc levels:   T12-L1:  Normal.   L1-L2: Small disc bulge and mild bilateral facet arthropathy. No spinal canal stenosis or neural foraminal narrowing.   L2-L3: Disc bulge and facet arthropathy results mild spinal canal stenosis, unchanged.   L3-L4: Right eccentric disc bulge and facet arthropathy results moderate narrowing of the right subarticular zone with displacement of the traversing right L4 nerve root, new from prior.   L4-L5: Disc bulge and right-greater-than-left facet arthropathy results in moderate spinal canal stenosis, severe right and moderate left neural foraminal  narrowing, unchanged.   L5-S1: Small disc bulge without spinal canal stenosis or neural foraminal narrowing.   IMPRESSION: 1. 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 2. Moderate narrowing of the right subarticular zone at L3-4 with displacement of the traversing right L4 nerve root, new from prior.     Electronically Signed   By: Ryan Chess M.D.   On: 04/26/2023 14:48   MRI of cervical spine dated 08/27/22:  FINDINGS: The study is partially degraded by motion.   Alignment: Straightening of the cervical curvature.   Vertebrae: Marrow edema in the tip of the C7 spinous process with surrounding soft tissue edema. Endplate degenerative changes with associated marrow edema at C5-6. No evidence of discitis or aggressive bone lesion.   Cord: Mass effect on the cord at C5-6.  No cord signal abnormality.   Posterior Fossa, vertebral arteries, paraspinal tissues: Marrow edema about the C7 spinous process.   Disc levels:   C2-3: Posterior disc protrusion without significant spinal canal stenosis. Uncovertebral and facet degenerative changes resulting in moderate bilateral neural foraminal narrowing, left greater than right.   C3-4: Posterior disc osteophyte complex without significant spinal canal stenosis. Uncovertebral and facet degenerative changes resulting in mild bilateral neural foraminal narrowing, left greater than right.   C4-5: Posterior disc osteophyte complex without significant spinal canal stenosis. Uncovertebral and facet degenerative change resulting in mild right and severe left neural foraminal narrowing.   C5-6: Posterior disc osteophyte complex, asymmetric to the right, resulting in moderate spinal canal stenosis with mild mass effect on the cord without cord signal  abnormality. Uncovertebral and facet degenerative changes resulting in severe bilateral neural foraminal narrowing.   C6-7: Small posterior disc osteophyte complex without significant spinal canal stenosis. Uncovertebral and facet degenerative changes resulting in mild right and moderate left neural foraminal narrowing.   C7-T1: Facet degenerative change resulting mild bilateral neural foraminal narrowing. No significant spinal canal stenosis.   IMPRESSION: 1. Edema in the tip of the C7 spinous process with surrounding soft tissue edema suggestive of supraspinal ligament injury. 2. Degenerative changes of the cervical spine with moderate spinal canal stenosis at C5-6 where there is mild mass effect on the cord without cord signal abnormality. 3. Multilevel high-grade neural foraminal narrowing, as described above.  Electronically Signed   By: Katyucia  de Macedo Rodrigues M.D.   On: 08/26/2021 14:41    I have personally reviewed the images and agree with the above interpretation.  Assessment and Plan: Mr. Wombles is a pleasant 53 y.o. male with known lumbar stenosis and neurogenic claudication who comes today 2 weeks after a fall with increased numbness in his legs and decreased ability to walk.  Plan includes the following movement:  -Lumbar spine x-rays - MRI of the lumbar spine due to new fall with increased numbness. - Tizanidine and steroid Dosepak for pain - Counseled on smoking cessation - Physical therapy referral sent - See back in 6 weeks  I spent a total of 45 minutes in face-to-face and non-face-to-face activities related to this patient's care today including review of outside records, review of imaging, review of symptoms, physical exam, discussion of differential diagnosis, discussion of treatment options, and documentation.   Charan Prieto PA-C Dept. of Neurosurgery

## 2023-12-16 ENCOUNTER — Telehealth (HOSPITAL_COMMUNITY): Payer: Self-pay | Admitting: Cardiology

## 2023-12-16 NOTE — Telephone Encounter (Signed)
 Called to confirm/remind patient of their appointment at the [facility] on 12/16/2023.   Appointment:   [x] Confirmed  [] Left mess   [] No answer/No voice mail  [] VM Full/unable to leave message  [] Phone not in service  Patient reminded to bring all medications and/or complete list.  Confirmed patient has transportation. Gave directions, instructed to utilize valet parking.

## 2023-12-19 NOTE — Progress Notes (Incomplete)
 ADVANCED HEART FAILURE CLINIC NOTE  Referring Physician: Chandra Toribio POUR, MD  Primary Care: Chandra Toribio POUR, MD Primary Cardiologist: Dr. Sheena HF: Dr. Gardenia  CC: Heart Failure HPI: Christopher Green is a 53 y.o. male with T2DM, HTN, DVT/PE, HTN, CHB s/p PPM, atrial fibrillation / flutter, lupus anticoagulant with elevated factor 8 on lifelong anticoagulation for antiphospholipid syndrome presenting today for evaluation of cardiac sarcoid  Christopher Green first established care with [facility] in October 2019 when he had an abnormal nuclear stress test followed by left heart catheterization in 03/2018 with reportedly minimal plaque.  During that time he was also seen for uncontrolled hypertension in the emergency department.  He was not seen by [facility] cardiology until December 2023 when he presented to [facility] with complaints of shortness of breath and a systolic blood pressure over 200 and heart rate of 40.  CTA PE on admission was significant for acute segmental and subsegmental pulmonary emboli.  While admitted patient was also significantly bradycardic with Mobitz 2 heart block.  Echocardiogram during admission with LVEF of 65 to 70%.  Due to persistent symptoms of dyspnea and 2-1 AV block with right bundle branch block on EKG he underwent placement of permanent pacemaker by Dr. Danelle in 2024.  Despite placement of permanent pacemaker he had multiple episodes of syncope in August 2024.  Device interrogation by EP with atrial fibrillation with controlled ventricular rates and atrial flutter.  During this time he also had intermittent low blood pressures that were associated with his syncope but etiology could not be determined.  He subsequently underwent cardiac MRI on 10/24 which demonstrated severe LVH of the septum with multifocal LGE that was not present in the areas of hypertrophy.  Patient subsequently sent to heart failure clinic for further evaluation.  He reports that his father passed in  his 77s from a 'heart attack' but did not undergo LHC; he did have an autopsy.  We are trying to obtain records from his autopsy. His grandfather passed from a similar condition.   HRCT notable for hilar adenopathy. EBUS on 08/16/23 negative for granulomas or signs of mediastinal sarcoidosis.   Today he returns for HF follow up.Overall feeling fine. Occasional SOB with exertion. Functionally limited by back pain. Denies PND/Orthopnea. Having difficuly sleeping. Appetite ok. Intolerant ozempic  due to nausea. No fever or chills.  Taking all medications. Smoking 1 PPD. Not interested inquiting.He did stop drinking alcohol.   Interval hx: -S/P TEE- DC-CV with conversion to SR.   - Underwent EBUS on 08/16/23; biopsies negative for sarcoid.  - Genetic panel positive for MYH6 -  Itamar4/10/25 AHI 12.9  Mild OSA- Recommendations to start Apnea treatment with CPAP.   Current Outpatient Medications  Medication Sig Dispense Refill   albuterol  (VENTOLIN  HFA) 108 (90 Base) MCG/ACT inhaler Inhale 2 puffs into the lungs every 6 (six) hours as needed for wheezing or shortness of breath. 8 g 0   amiodarone  (PACERONE ) 200 MG tablet Take 1 tablet (200 mg total) by mouth daily. 90 tablet 3   atorvastatin  (LIPITOR) 40 MG tablet TAKE 1 TABLET (40 MG TOTAL) BY MOUTH DAILY. 90 tablet 1   carvedilol  (COREG ) 3.125 MG tablet TAKE 1 TABLET BY MOUTH 2 TIMES DAILY WITH A MEAL. 60 tablet 4   cetirizine  (ZYRTEC ) 10 MG tablet Take 1 tablet (10 mg total) by mouth daily. 30 tablet 11   DULoxetine  (CYMBALTA ) 30 MG capsule Take 1 capsule (30 mg total) by mouth as needed (nerve  pain).     empagliflozin  (JARDIANCE ) 10 MG TABS tablet Take 1 tablet (10 mg total) by mouth daily before breakfast. 30 tablet 6   icosapent  Ethyl (VASCEPA ) 1 g capsule Take 2 capsules (2 g total) by mouth 2 (two) times daily. 120 capsule 6   metFORMIN  (GLUCOPHAGE -XR) 750 MG 24 hr tablet Take 2 tablets (1,500 mg total) by mouth daily. (Patient taking differently:  Take 750 mg by mouth in the morning and at bedtime.) 180 tablet 1   methylPREDNISolone  (MEDROL  DOSEPAK) 4 MG TBPK tablet Take by mouth daily, taper daily dose per package instructions. 21 tablet 0   oxyCODONE -acetaminophen  (PERCOCET) 10-325 MG tablet TAKE 1 TABLET BY MOUTH EVERY 4 TO 6 HOURS AS NEEDED FOR PAIN FILL 11/02/2023 150 tablet 0   potassium chloride  SA (KLOR-CON  M) 20 MEQ tablet TAKE 1 TABLET BY MOUTH 2 TIMES DAILY. 33 tablet 6   pregabalin  (LYRICA ) 300 MG capsule Take 300 mg by mouth daily as needed (pain).     rivaroxaban  (XARELTO ) 20 MG TABS tablet Take 1 tablet (20 mg total) by mouth daily with supper. 30 tablet 11   sacubitril-valsartan  (ENTRESTO ) 24-26 MG Take 1 tablet by mouth 2 (two) times daily. 60 tablet 6   sildenafil  (VIAGRA ) 100 MG tablet Take 0.5-1 tablets (50-100 mg total) by mouth daily as needed for erectile dysfunction. 10 tablet 11   spironolactone  (ALDACTONE ) 25 MG tablet Take 0.5 tablets (12.5 mg total) by mouth at bedtime. 45 tablet 1   testosterone  cypionate (DEPOTESTOSTERONE CYPIONATE) 200 MG/ML injection INJECT 0.5 ML INTO THE MUSCLE EVERY 14 DAYS. 10 mL 2   tiZANidine  (ZANAFLEX ) 4 MG tablet Take 1 tablet (4 mg total) by mouth 3 (three) times daily. 90 tablet 1   torsemide  (DEMADEX ) 20 MG tablet Take 1 tablet (20 mg total) by mouth every other day.     No current facility-administered medications for this visit.    No Known Allergies  PHYSICAL EXAM: There were no vitals filed for this visit. GENERAL: NAD Lungs- *** CARDIAC:  JVP: *** cm          Normal rate with regular rhythm. *** murmur.  Pulses ***. *** edema.  ABDOMEN: Soft, non-tender, non-distended.  EXTREMITIES: Warm and well perfused.  NEUROLOGIC: No obvious FND   DATA REVIEW  ECG: 05/28/23: Atrial flutter with intermittent pacing as per my personal interpretation  ECHO: 02/12/23: LVEF 60 to 65% with normal RV function.  Severe LVH as per Dr Gardenia.   CMR (04/12/23): 1. Mild decrease  in LVEF, LVEF 46%, in the setting of free breathing artifact.  2. There is severe hypertrophy of the LV septum. Though this meets criteria for hypertrophic cardiomyopathy, this is multi-focal late gadolinium enhancement with increase in ECV signal; this is not present in the areas of hypertrophy. Consider PET imaging for inflammatory cardiomyopathy evaluation.  Cardiac PET (05/19/23):   FDG uptake findings are consistent with active myocardial inflammation/sarcoidosis.   FDG uptake was observed. FDG uptake was focal. FDG uptake was present in the apical to basal lateral, apical anterior and apex segment(s). LV perfusion is abnormal. Defect 1: There is a small defect with mild reduction in uptake present in the apical inferior location(s).   Left ventricular function is abnormal. Global function is moderately reduced. EF: 36%.   Coronary calcium  was present on the attenuation correction CT images. Moderate coronary calcifications were present. Coronary calcifications were present in the left anterior descending artery, left circumflex artery and right coronary artery distribution(s).  Electronically signed by Lonni Nanas, MD  ASSESSMENT & PLAN:  Heart failure with mildly reduced EF, Evaluation for infiltrative cardiomyopathy Etiology of HF: Nonischemic cardiomyopathy with significant LVH.  Although he does not have the typical characteristics of cardiac sarcoid he does have patchy LGE on cardiac MRI.  Interestingly he has severe LVH that would be more consistent with hypertrophic cardiomyopathy.  Genetic panel to rule out HCM. Cardiac PET consistent with sarcoid. He has now had a HRCT which has some hilar adenopathy. EBUS negative for sarcoid.  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  NYHA class / AHA Stage: NYHA III Volume status & Diuretics: Appears euvolemic.  Continue torsemide  every other day  Vasodilators: Entresto  24/26mg  BID; SBP at goal.  Beta-Blocker: Coreg  3.125 mg twice daily MRA: Spironolactone  12.5 mg daily. Switch to bedtime.  Cardiometabolic: Jardiance  10 mg daily Devices therapies & Valvulopathies: Dual-chamber permanent pacemaker placed by Dr. Waddell for advanced AV block. Will need to upgrade device to ICD.  Advanced therapies: Not indicated  2. Cardiac sarcoid - Reviewed pulmonology notes from 08/26/23; EBUS on 08/16/23 negative for sarcoid.  - Quantiferon-gold negative - Will review vaccination record -PET consistent with sarcoid. EBUS negative -Genetic panel concerning for HCM. Will discuss with imaging colleagues. + MYH6.  - Will need to refer to Dr Fairy for genetic counseling.   3. PAF -Noted to be in persistent atrial fibrillation since March 2024 09/2023 S/P TEE DC-CV with conversion to SR. Cut back amio 200 mg daily. - In SR today.  -Continue Xarelto  20 mg daily -Followed by Dr. Waddell -Continue Coreg  3.125 mg twice daily - Cut back amio 200 mg daily.  Patient is on amiodarone .  - Plan to follow amiodarone  screening per guidelines  -Check TSH, LFTS today.  - Needs yearly eye exams and PFTs.  We discussed today.  - Waiting to start CPAP.   4. Coronary artery disease -Nonobstructive mild CAD per left heart cath in 2019. -No chest pain.   5. Hyperlipidemia -Continue Lipitor 40 mg daily  6. History of PE/DVT and antiphospholipid antibody syndrome -Will require lifelong anticoagulation with Xarelto  20 mg daily.  7. Severe back pain - Followed by NSGY - Based on chart review from 07/26/23, saw Lyle Decamp; underwent C7-T1 injection for pain control.  - Remains limited by back pain.   8. Obesity  There is no height or weight on file to calculate BMI. He has tried Ozempic  but had nausea so he stopped. He does not want to re challenge.   9. Tobacco Abuse Smokes 1 PPD. Discussed cessation. He does not want to  quit.     Follow up in 2 months with Dr Gardenia.   Ria Gardenia NP-C  2:54 PM

## 2023-12-20 ENCOUNTER — Other Ambulatory Visit: Payer: Self-pay | Admitting: Family Medicine

## 2023-12-20 ENCOUNTER — Ambulatory Visit (HOSPITAL_COMMUNITY)
Admission: RE | Admit: 2023-12-20 | Discharge: 2023-12-20 | Disposition: A | Discharge status: Home / Self Care | Source: Ambulatory Visit | Attending: Cardiology | Admitting: Cardiology

## 2023-12-20 VITALS — BP 134/90 | HR 67 | Wt 288.2 lb

## 2023-12-20 DIAGNOSIS — I10 Essential (primary) hypertension: Secondary | ICD-10-CM | POA: Diagnosis not present

## 2023-12-20 DIAGNOSIS — I5022 Chronic systolic (congestive) heart failure: Secondary | ICD-10-CM | POA: Diagnosis not present

## 2023-12-20 DIAGNOSIS — Z79899 Other long term (current) drug therapy: Secondary | ICD-10-CM | POA: Insufficient documentation

## 2023-12-20 DIAGNOSIS — I4892 Unspecified atrial flutter: Secondary | ICD-10-CM | POA: Diagnosis not present

## 2023-12-20 DIAGNOSIS — I110 Hypertensive heart disease with heart failure: Secondary | ICD-10-CM | POA: Diagnosis not present

## 2023-12-20 DIAGNOSIS — E669 Obesity, unspecified: Secondary | ICD-10-CM | POA: Diagnosis not present

## 2023-12-20 DIAGNOSIS — I2510 Atherosclerotic heart disease of native coronary artery without angina pectoris: Secondary | ICD-10-CM | POA: Insufficient documentation

## 2023-12-20 DIAGNOSIS — E785 Hyperlipidemia, unspecified: Secondary | ICD-10-CM | POA: Insufficient documentation

## 2023-12-20 DIAGNOSIS — Z86711 Personal history of pulmonary embolism: Secondary | ICD-10-CM | POA: Diagnosis not present

## 2023-12-20 DIAGNOSIS — I428 Other cardiomyopathies: Secondary | ICD-10-CM | POA: Diagnosis not present

## 2023-12-20 DIAGNOSIS — Z7901 Long term (current) use of anticoagulants: Secondary | ICD-10-CM | POA: Diagnosis not present

## 2023-12-20 DIAGNOSIS — Z950 Presence of cardiac pacemaker: Secondary | ICD-10-CM | POA: Insufficient documentation

## 2023-12-20 DIAGNOSIS — D6861 Antiphospholipid syndrome: Secondary | ICD-10-CM | POA: Diagnosis not present

## 2023-12-20 DIAGNOSIS — Z5181 Encounter for therapeutic drug level monitoring: Secondary | ICD-10-CM

## 2023-12-20 DIAGNOSIS — Z86718 Personal history of other venous thrombosis and embolism: Secondary | ICD-10-CM | POA: Diagnosis not present

## 2023-12-20 DIAGNOSIS — J3089 Other allergic rhinitis: Secondary | ICD-10-CM

## 2023-12-20 DIAGNOSIS — Z7984 Long term (current) use of oral hypoglycemic drugs: Secondary | ICD-10-CM | POA: Insufficient documentation

## 2023-12-20 DIAGNOSIS — F17200 Nicotine dependence, unspecified, uncomplicated: Secondary | ICD-10-CM | POA: Insufficient documentation

## 2023-12-20 DIAGNOSIS — I480 Paroxysmal atrial fibrillation: Secondary | ICD-10-CM | POA: Insufficient documentation

## 2023-12-20 DIAGNOSIS — Z6838 Body mass index (BMI) 38.0-38.9, adult: Secondary | ICD-10-CM | POA: Diagnosis not present

## 2023-12-20 DIAGNOSIS — M545 Low back pain, unspecified: Secondary | ICD-10-CM | POA: Insufficient documentation

## 2023-12-20 DIAGNOSIS — I441 Atrioventricular block, second degree: Secondary | ICD-10-CM | POA: Insufficient documentation

## 2023-12-20 DIAGNOSIS — E119 Type 2 diabetes mellitus without complications: Secondary | ICD-10-CM | POA: Insufficient documentation

## 2023-12-20 DIAGNOSIS — R590 Localized enlarged lymph nodes: Secondary | ICD-10-CM | POA: Insufficient documentation

## 2023-12-20 DIAGNOSIS — I2782 Chronic pulmonary embolism: Secondary | ICD-10-CM

## 2023-12-20 LAB — BASIC METABOLIC PANEL WITH GFR
Anion gap: 11 (ref 5–15)
BUN: 8 mg/dL (ref 6–20)
CO2: 27 mmol/L (ref 22–32)
Calcium: 9.8 mg/dL (ref 8.9–10.3)
Chloride: 106 mmol/L (ref 98–111)
Creatinine, Ser: 0.97 mg/dL (ref 0.61–1.24)
GFR, Estimated: >60 mL/min (ref >60)
Glucose, Bld: 114 mg/dL — ABNORMAL HIGH (ref 70–99)
Potassium: 4.8 mmol/L (ref 3.5–5.1)
Sodium: 144 mmol/L (ref 135–145)

## 2023-12-20 LAB — BRAIN NATRIURETIC PEPTIDE: B Natriuretic Peptide: 51.3 pg/mL (ref 0.0–100.0)

## 2023-12-20 NOTE — Patient Instructions (Signed)
 Medication Changes:  No Changes In Medications at this time.   Lab Work:  Labs done today, your results will be available in MyChart, we will contact you for abnormal readings.  Testing/Procedures:  ECHOCARDIOGRAM AS SCHEDULED   Follow-Up in: 3 MONTHS WITH DR. GARDENIA PLEASE CALL OUR OFFICE AROUND AUGUST TO GET SCHEDULED FOR YOUR APPOINTMENT. PHONE NUMBER IS [phone] OPTION 2   At the [facility], you and your health needs are our priority. We have a designated team specialized in the treatment of Heart Failure. This Care Team includes your primary Heart Failure Specialized Cardiologist (physician), Advanced Practice Providers (APPs- Physician Assistants and Nurse Practitioners), and Pharmacist who all work together to provide you with the care you need, when you need it.   You may see any of the following providers on your designated Care Team at your next follow up:  Dr. Toribio Fuel Dr. Ezra Shuck Dr. Ria GARDENIA Dr. Odis Brownie Greig Mosses, NP Caffie Shed, [place] [facility] [place], [place] Beckey Coe, NP [place] Lee, NP Tinnie Redman, PharmD   Please be sure to bring in all your medications bottles to every appointment.   Need to Contact Us :  If you have any questions or concerns before your next appointment please send us  a message through [facility] or call our office at [phone].    TO LEAVE A MESSAGE FOR THE NURSE SELECT OPTION 2, PLEASE LEAVE A MESSAGE INCLUDING: YOUR NAME DATE OF BIRTH CALL BACK NUMBER REASON FOR CALL**this is important as we prioritize the call backs  YOU WILL RECEIVE A CALL BACK THE SAME DAY AS LONG AS YOU CALL BEFORE 4:00 PM

## 2023-12-23 ENCOUNTER — Ambulatory Visit (INDEPENDENT_AMBULATORY_CARE_PROVIDER_SITE_OTHER): Admitting: Family Medicine

## 2023-12-23 ENCOUNTER — Encounter: Payer: Self-pay | Admitting: Family Medicine

## 2023-12-23 VITALS — BP 111/64 | HR 59 | Ht 73.0 in | Wt 281.0 lb

## 2023-12-23 DIAGNOSIS — E1169 Type 2 diabetes mellitus with other specified complication: Secondary | ICD-10-CM

## 2023-12-23 DIAGNOSIS — M5441 Lumbago with sciatica, right side: Secondary | ICD-10-CM | POA: Diagnosis not present

## 2023-12-23 DIAGNOSIS — G629 Polyneuropathy, unspecified: Secondary | ICD-10-CM

## 2023-12-23 DIAGNOSIS — Z7984 Long term (current) use of oral hypoglycemic drugs: Secondary | ICD-10-CM

## 2023-12-23 DIAGNOSIS — M5442 Lumbago with sciatica, left side: Secondary | ICD-10-CM

## 2023-12-23 DIAGNOSIS — G8929 Other chronic pain: Secondary | ICD-10-CM

## 2023-12-23 MED ORDER — OXYCODONE-ACETAMINOPHEN 10-325 MG PO TABS: ORAL_TABLET | ORAL | 0 refills | Status: DC

## 2023-12-23 NOTE — Assessment & Plan Note (Addendum)
 Longstanding history of bilateral foot numbness, pain, and discoloration. A1c is well-controlled at 6.3. Exam reveals sensory deficits consistent with neuropathy. Feet are cool to touch, concerning for peripheral artery disease. Pt complaining of both neuropathy and claudication, may be hard for him to distinguish between the two.  We discussed how he can have both and the differences between the two.   - Continue current medications for neuropathy. Most likely diabetic neuropathy but poor peripheral vasculature may be contributing.    - Discussed ankle-brachial index (ABI) testing to evaluate for peripheral artery disease. Plan to hold off on ordering until after back MRI results are available. - Discussed potential treatments for PAD including walking therapy, medications (for which he is not a good candidate due to being on Xarelto ), and surgical stenting. - Continue annual diabetic foot exams.

## 2023-12-23 NOTE — Progress Notes (Signed)
 Established Patient Office Visit  Subjective   Patient ID: Christopher Green, male    DOB: December 16, 1970  Age: 53 y.o. MRN: [id number]  Chief Complaint  Patient presents with   Medical Management of Chronic Issues    HPI  Subjective - Reports fall from approximately 4 feet onto dirt, landing on his buttocks. This occurred while working on a boat. - Experiencing significant back pain and difficulty walking since the fall. - States he is unable to function due to pain. - He received Zanaflex  and a steroid pack from another provider on 12/15/2023. He takes steroids as needed. - Reports a history of passing out episodes last year, which have resolved since a blood pressure medication was stopped by his cardiologist. - Reports significant bilateral foot pain and numbness, which has been present for years. Describes feet as feeling numb in the toes, bottom, front, and sides. Pain is unbearable within 30 minutes of removing footwear. Feet turn blue intermittently. - Expresses frustration with his complex medication regimen and wishes to reduce the number of pills. - Has an upcoming echocardiogram and MRI of his back scheduled. - Was advised to do physical therapy but finds it difficult due to travel and cost. Discussed potential for home health PT.  Medications: Amiodarone , Lipitor (atorvastatin ), carvedilol , Zyrtec  (cetirizine ) prn, Cymbalta  (duloxetine ) prn for pain, Jardiance , Vascepa , metformin , potassium, Lyrica  (pregabalin ) prn, Entresto , spironolactone , Viagra  prn, Zanaflex  (tizanidine ), Torsemide , oxycodone .   PMH, PSH, FH, Social Hx: PMHx: High cholesterol, heart failure, cardiac sarcoidosis, Factor V Leiden mutation, diabetes (A1C 6.3), coronary artery disease, peripheral artery disease, neuropathy, history of falls, pacemaker.  ROS: Pertinent positives: back pain, foot pain, foot numbness, foot discoloration. Pertinent negatives: No recent syncope.  The 10-year ASCVD risk score (Arnett  DK, et al., 2019) is: 16.8%  Health Maintenance Due  Topic Date Due   COVID-19 Vaccine (1) Never done   OPHTHALMOLOGY EXAM  Never done   Hepatitis C Screening  Never done   Pneumococcal Vaccine 38-58 Years old (1 of 2 - PCV) Never done   Hepatitis B Vaccines (1 of 3 - 19+ 3-dose series) Never done   Zoster Vaccines- Shingrix (1 of 2) Never done   Colonoscopy  Never done      Objective:     BP 111/64   Pulse (!) 59   Ht 6' 1 (1.854 m)   Wt 281 lb (127.5 kg)   SpO2 96%   BMI 37.07 kg/m    Physical Exam General: Appears in discomfort. Pulm: no resp distress Extremities: Feet are cool to touch.  Decreased sensation to monofilament testing on bilateral feet, particularly midfoot and metatarsals. Sensation present more proximally on the legs.   No results found for any visits on 12/23/23.      Assessment & Plan:   Type 2 diabetes mellitus with other specified complication, without long-term current use of insulin  (HCC) Assessment & Plan: A1c 6.3 today.  Well controlled.  Continue current mgmt.  Foot exam showed b/l decreased/absent sensation   Orders: -     Microalbumin / creatinine urine ratio; Future  Chronic bilateral low back pain with bilateral sciatica Assessment & Plan: History of chronic back pain with recent acute exacerbation after a 4-foot fall. MRI of the back is scheduled to evaluate for any new changes. - Increase oxycodone  to 160 tablets per month (from 150) to allow for an extra tablet on 10 days out of the month for breakthrough pain.   Peripheral polyneuropathy Assessment & Plan:  Longstanding history of bilateral foot numbness, pain, and discoloration. A1c is well-controlled at 6.3. Exam reveals sensory deficits consistent with neuropathy. Feet are cool to touch, concerning for peripheral artery disease. Pt complaining of both neuropathy and claudication, may be hard for him to distinguish between the two.  We discussed how he can have both and the  differences between the two.   - Continue current medications for neuropathy. Most likely diabetic neuropathy but poor peripheral vasculature may be contributing.    - Discussed ankle-brachial index (ABI) testing to evaluate for peripheral artery disease. Plan to hold off on ordering until after back MRI results are available. - Discussed potential treatments for PAD including walking therapy, medications (for which he is not a good candidate due to being on Xarelto ), and surgical stenting. - Continue annual diabetic foot exams.   Other orders -     oxyCODONE -Acetaminophen ; TAKE 1 TABLET BY MOUTH EVERY 4 TO 6 HOURS AS NEEDED FOR PAIN FILL 11/02/2023  Dispense: 160 tablet; Refill: 0     Return for DM.    Christopher MARLA Slain, MD

## 2023-12-23 NOTE — Patient Instructions (Signed)
 It was nice to see you today,  We addressed the following topics today: -I have increased your oxycodone  amount by 10 tablets this month due to your recent injury. - At some point we should get what is called ankle-brachial index testing to check for peripheral artery disease in your legs. - At any time you can come by and drop off a urine sample.  Have a great day,  Rolan Slain, MD

## 2023-12-23 NOTE — Assessment & Plan Note (Signed)
 History of chronic back pain with recent acute exacerbation after a 4-foot fall. MRI of the back is scheduled to evaluate for any new changes. - Increase oxycodone  to 160 tablets per month (from 150) to allow for an extra tablet on 10 days out of the month for breakthrough pain.

## 2023-12-24 DIAGNOSIS — G4733 Obstructive sleep apnea (adult) (pediatric): Secondary | ICD-10-CM | POA: Diagnosis not present

## 2023-12-24 NOTE — Assessment & Plan Note (Signed)
 A1c 6.3 today.  Well controlled.  Continue current mgmt.  Foot exam showed b/l decreased/absent sensation

## 2023-12-25 DIAGNOSIS — Z419 Encounter for procedure for purposes other than remedying health state, unspecified: Secondary | ICD-10-CM | POA: Diagnosis not present

## 2023-12-29 DIAGNOSIS — M79604 Pain in right leg: Secondary | ICD-10-CM | POA: Diagnosis not present

## 2023-12-29 DIAGNOSIS — R262 Difficulty in walking, not elsewhere classified: Secondary | ICD-10-CM | POA: Diagnosis not present

## 2023-12-29 DIAGNOSIS — M5459 Other low back pain: Secondary | ICD-10-CM | POA: Diagnosis not present

## 2023-12-29 DIAGNOSIS — R202 Paresthesia of skin: Secondary | ICD-10-CM | POA: Diagnosis not present

## 2023-12-30 ENCOUNTER — Ambulatory Visit (HOSPITAL_COMMUNITY)
Admission: RE | Admit: 2023-12-30 | Discharge: 2023-12-30 | Disposition: A | Discharge status: Home / Self Care | Source: Ambulatory Visit | Attending: Family Medicine | Admitting: Family Medicine

## 2023-12-30 DIAGNOSIS — I080 Rheumatic disorders of both mitral and aortic valves: Secondary | ICD-10-CM | POA: Diagnosis not present

## 2023-12-30 DIAGNOSIS — I110 Hypertensive heart disease with heart failure: Secondary | ICD-10-CM | POA: Insufficient documentation

## 2023-12-30 DIAGNOSIS — I5022 Chronic systolic (congestive) heart failure: Secondary | ICD-10-CM | POA: Insufficient documentation

## 2023-12-30 LAB — ECHOCARDIOGRAM COMPLETE
Area-P 1/2: 4.44 cm2
Calc EF: 62 %
S' Lateral: 4 cm
Single Plane A2C EF: 66.1 %
Single Plane A4C EF: 59.1 %

## 2023-12-30 MED ORDER — PERFLUTREN LIPID MICROSPHERE
1.0000 mL | INTRAVENOUS | Status: AC | PRN
  Administered 2023-12-30: 1 mL via INTRAVENOUS

## 2023-12-30 NOTE — CV Procedure (Signed)
  Device system confirmed to be MRI conditional, with implant date > 6 weeks ago, and no evidence of abandoned or epicardial leads in review of most recent CXR  Device last cleared by EP Provider: Prentice Passey 12/30/23  Clearance is good through for 1 year as long as parameters remain stable at time of check. If pt undergoes a cardiac device procedure during that time, they should be re-cleared.   Tachy-therapies to be programmed off if applicable with device back to pre-MRI settings after completion of exam.  [employer] will be present for programming for the MRI.   Christopher Green, RT  12/30/2023 10:40 AM

## 2024-01-04 ENCOUNTER — Ambulatory Visit (HOSPITAL_COMMUNITY)
Admission: RE | Admit: 2024-01-04 | Discharge: 2024-01-04 | Disposition: A | Discharge status: Home / Self Care | Source: Ambulatory Visit | Attending: Physician Assistant | Admitting: Physician Assistant

## 2024-01-04 DIAGNOSIS — M79604 Pain in right leg: Secondary | ICD-10-CM | POA: Diagnosis not present

## 2024-01-04 DIAGNOSIS — M5459 Other low back pain: Secondary | ICD-10-CM | POA: Diagnosis not present

## 2024-01-04 DIAGNOSIS — W19XXXD Unspecified fall, subsequent encounter: Secondary | ICD-10-CM | POA: Insufficient documentation

## 2024-01-04 DIAGNOSIS — M48062 Spinal stenosis, lumbar region with neurogenic claudication: Secondary | ICD-10-CM | POA: Insufficient documentation

## 2024-01-04 DIAGNOSIS — R262 Difficulty in walking, not elsewhere classified: Secondary | ICD-10-CM | POA: Diagnosis not present

## 2024-01-04 DIAGNOSIS — R202 Paresthesia of skin: Secondary | ICD-10-CM | POA: Diagnosis not present

## 2024-01-10 ENCOUNTER — Ambulatory Visit: Payer: Self-pay | Admitting: Physician Assistant

## 2024-01-10 NOTE — Progress Notes (Unsigned)
 Sleep Medicine CONSULT Note    Date:  01/11/2024   ID:  XAIVER Green, DOB 05-20-1971, MRN [id number]  PCP:  Chandra Toribio POUR, MD  Cardiologist: Kardie Tobb, DO   Chief Complaint  Patient presents with   New Patient (Initial Visit)    OSA    History of Present Illness:  Christopher Green is a 53 y.o. male who is being seen today for the evaluation of obstructive sleep apnea at the request of Kardie Tobb, DO.  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  Due to a history of atrial arrhythmias he was referred for home sleep study.  Home sleep study demonstrated mild obstructive sleep apnea with an AHI of 12.9/h and no significant central events.  There was no nocturnal hypoxemia.  He did have prolonged REM sleep onset latency 856 minutes with normal sleep onset latency of 19 minutes.  Total time spent in REM sleep was 2.9%.  He was started on auto CPAP from 4 to 15 cm H2O.  He is now referred for sleep medicine consultation for treatment of obstructive sleep apnea.  He is doing well with his PAP device and thinks that he has gotten used to it.  He tolerates the full face mask but it is taking him a while to get used to wearing it.  He feels the pressure is adequate.  He has not really noticed an improvement in his daytime sleepiness but has not been compliant with his device. He denies any significant mouth or nasal dryness or nasal congestion.  He does not think that he snores. An Epworth Sleepiness Scale score was calculated the office today and this endorsed at 8 arguing against residual daytime sleepiness. Patient denies any episodes of bruxism, restless legs, No gagging hallucinations or cataplectic events.     Past Medical History:  Diagnosis Date   Alcohol abuse    Arthritis    Atrial flutter (HCC)     Cardiac sarcoidosis    Chronic pain    Diabetes mellitus without complication (HCC)    DVT (deep venous thrombosis) (HCC) 05/26/2022   LLE   Dysrhythmia    2nd degree AV Block, Type 2 s/p 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/22   Hypercoagulable state (HCC)    elevated factor VIII, persistent + lupus anticoagulant   Hyperlipidemia    Hypertension    Obesity    OSA on CPAP    mild obstructive sleep apnea with an AHI of 12.9/h and no significant central events. on auto CPAP   PE (pulmonary thromboembolism) (HCC) 05/25/2022   Peripheral polyneuropathy    Presence of permanent cardiac pacemaker    Tobacco abuse    Venous insufficiency     Past Surgical History:  Procedure Laterality Date   BRONCHIAL WASHINGS  08/16/2023   Procedure: IRRIGATION, BRONCHUS;  Surgeon: Shelah Lamar RAMAN, MD;  Location: [facility] ENDOSCOPY;  Service: Pulmonary;;   CARDIAC CATHETERIZATION  03/25/2018   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, mLAD, DIAG, pCX, pRCA)   CARDIOVERSION N/A 09/22/2023   Procedure: CARDIOVERSION;  Surgeon: Gardenia Led, DO;  Location: [facility] INVASIVE CV LAB;  Service: Cardiovascular;  Laterality: N/A;   CARPAL TUNNEL RELEASE Left    FINE NEEDLE ASPIRATION  08/16/2023   Procedure: FINE NEEDLE ASPIRATION (FNA) LINEAR;  Surgeon: Shelah Lamar RAMAN, MD;  Location: [facility] ENDOSCOPY;  Service: Pulmonary;;  PACEMAKER IMPLANT N/A 08/17/2022   Procedure: PACEMAKER IMPLANT;  Surgeon: Waddell Danelle ORN, MD;  [facility] INVASIVE CV LAB;  Service: Cardiovascular;  Laterality: N/A;   TRANSESOPHAGEAL ECHOCARDIOGRAM (CATH LAB) N/A 09/22/2023   Procedure: TRANSESOPHAGEAL ECHOCARDIOGRAM;  Surgeon: Gardenia Led, DO;  Location: [facility] INVASIVE CV LAB;  Service: Cardiovascular;  Laterality: N/A;   VIDEO BRONCHOSCOPY WITH ENDOBRONCHIAL ULTRASOUND N/A 08/16/2023   Procedure: BRONCHOSCOPY, WITH EBUS;  Surgeon: Shelah Lamar RAMAN, MD;  Location: [facility] ENDOSCOPY;  Service: Pulmonary;  Laterality: N/A;    Current Medications: Current Meds  Medication Sig    albuterol  (VENTOLIN  HFA) 108 (90 Base) MCG/ACT inhaler Inhale 2 puffs into the lungs every 6 (six) hours as needed for wheezing or shortness of breath.   amiodarone  (PACERONE ) 200 MG tablet Take 1 tablet (200 mg total) by mouth daily.   atorvastatin  (LIPITOR) 40 MG tablet TAKE 1 TABLET (40 MG TOTAL) BY MOUTH DAILY.   carvedilol  (COREG ) 3.125 MG tablet TAKE 1 TABLET BY MOUTH 2 TIMES DAILY WITH A MEAL.   cetirizine  (ZYRTEC ) 10 MG tablet TAKE 1 TABLET (10 MG TOTAL) BY MOUTH DAILY.   DULoxetine  (CYMBALTA ) 30 MG capsule Take 1 capsule (30 mg total) by mouth as needed (nerve pain).   empagliflozin  (JARDIANCE ) 10 MG TABS tablet Take 1 tablet (10 mg total) by mouth daily before breakfast.   icosapent  Ethyl (VASCEPA ) 1 g capsule Take 2 capsules (2 g total) by mouth 2 (two) times daily.   metFORMIN  (GLUCOPHAGE -XR) 750 MG 24 hr tablet Take 2 tablets (1,500 mg total) by mouth daily.   oxyCODONE -acetaminophen  (PERCOCET) 10-325 MG tablet TAKE 1 TABLET BY MOUTH EVERY 4 TO 6 HOURS AS NEEDED FOR PAIN FILL 11/02/2023   potassium chloride  SA (KLOR-CON  M) 20 MEQ tablet TAKE 1 TABLET BY MOUTH 2 TIMES DAILY.   pregabalin  (LYRICA ) 300 MG capsule Take 300 mg by mouth daily as needed (pain).   rivaroxaban  (XARELTO ) 20 MG TABS tablet Take 1 tablet (20 mg total) by mouth daily with supper.   sacubitril-valsartan  (ENTRESTO ) 24-26 MG Take 1 tablet by mouth 2 (two) times daily.   sildenafil  (VIAGRA ) 100 MG tablet Take 0.5-1 tablets (50-100 mg total) by mouth daily as needed for erectile dysfunction.   spironolactone  (ALDACTONE ) 25 MG tablet Take 0.5 tablets (12.5 mg total) by mouth at bedtime.   testosterone  cypionate (DEPOTESTOSTERONE CYPIONATE) 200 MG/ML injection INJECT 0.5 ML INTO THE MUSCLE EVERY 14 DAYS.   torsemide  (DEMADEX ) 20 MG tablet Take 1 tablet (20 mg total) by mouth every other day.    Allergies:   Patient has no known allergies.   Social History   Socioeconomic History   Marital status: Married    Spouse  name: Not on file   Number of children: Not on file   Years of education: Not on file   Highest education level: Not on file  Occupational History   Not on file  Tobacco Use   Smoking status: Every Day    Types: Cigarettes    Passive exposure: Current   Smokeless tobacco: Never   Tobacco comments:    1ppd 08/26/23  Vaping Use   Vaping status: Never Used  Substance and Sexual Activity   Alcohol use: Yes    Comment: daily   Drug use: Never   Sexual activity: Not on file  Other Topics Concern   Not on file  Social History Narrative   Not on file   Social Drivers of Health   Financial Resource Strain: Not on  file  Food Insecurity: No Food Insecurity (05/25/2022)   Hunger Vital Sign    Worried About Running Out of Food in the Last Year: Never true    Ran Out of Food in the Last Year: Never true  Transportation Needs: No Transportation Needs (05/25/2022)   PRAPARE - Administrator, Civil Service (Medical): No    Lack of Transportation (Non-Medical): No  Physical Activity: Not on file  Stress: Not on file  Social Connections: Not on file     Family History:  The patient's family history includes Heart attack in his father.   ROS:   Please see the history of present illness.    ROS All other systems reviewed and are negative.      No data to display             PHYSICAL EXAM:   VS:  BP 104/62   Pulse 77   Ht 6' (1.829 m)   Wt 279 lb 12.8 oz (126.9 kg)   SpO2 98%   BMI 37.95 kg/m    GEN: Well nourished, well developed, in no acute distress  HEENT: normal  Neck: no JVD, carotid bruits, or masses Cardiac: RRR; no murmurs, rubs, or gallops,no edema.  Intact distal pulses bilaterally.  Respiratory:  clear to auscultation bilaterally, normal work of breathing GI: soft, nontender, nondistended, + BS MS: no deformity or atrophy  Skin: warm and dry, no rash Neuro:  Alert and Oriented x 3, Strength and sensation are intact Psych: euthymic mood, full  affect  Wt Readings from Last 3 Encounters:  01/11/24 279 lb 12.8 oz (126.9 kg)  12/23/23 281 lb (127.5 kg)  12/20/23 288 lb 3.2 oz (130.7 kg)      Studies/Labs Reviewed:   Home sleep study, PAP compliance download  Recent Labs: 09/03/2023: Hemoglobin 15.4; Platelets 227 10/25/2023: ALT 31; TSH 1.821 12/20/2023: B Natriuretic Peptide 51.3; BUN 8; Creatinine, Ser 0.97; Potassium 4.8; Sodium 144    CHA2DS2-VASc Score = 5   This indicates a 7.2% annual risk of stroke. The patient's score is based upon: CHF History: 0 HTN History: 1 Diabetes History: 1 Stroke History: 2 Vascular Disease History: 1 Age Score: 0 Gender Score: 0     ASSESSMENT:    1. OSA (obstructive sleep apnea)   2. Essential hypertension   3. OSA on CPAP      PLAN:  In order of problems listed above:  OSA - The patient is tolerating PAP therapy well without any problems. The PAP download performed by his DME was personally reviewed and interpreted by me today and showed an AHI of 2.5 /hr on auto CPAP from 4-15 H2O with 13% compliance in using more than 4 hours nightly.  The patient has been using and benefiting from PAP use and will continue to benefit from therapy.  -He goes to bed some without putting the mask on and then falls asleep and doesn't put it on. -I encouraged him to place his mask on before he goes to bed -I am going to try a Nasal cushion mask to see if he does better with that and order a chin strap -will get a download in 4 weeks  Hypertension - BP controlled on exam today - Continue carvedilol  3.125 mg twice daily, Entresto  24-26 mg twice daily, spironolactone  12.5 mg daily with as needed refills  Followup with me in 2 months  Time Spent: 20 minutes total time of encounter, including 15 minutes spent in face-to-face  patient care on the date of this encounter. This time includes coordination of care and counseling regarding above mentioned problem list. Remainder of non-face-to-face  time involved reviewing chart documents/testing relevant to the patient encounter and documentation in the medical record. I have independently reviewed documentation from referring provider  Medication Adjustments/Labs and Tests Ordered: Current medicines are reviewed at length with the patient today.  Concerns regarding medicines are outlined above.  Medication changes, Labs and Tests ordered today are listed in the Patient Instructions below.  There are no Patient Instructions on file for this visit.   Signed, Wilbert Bihari, MD  01/11/2024 8:18 AM    [facility] [address] [place], [place], [place]  [address] Phone: [phone]; Fax: [phone]

## 2024-01-11 ENCOUNTER — Ambulatory Visit: Attending: Internal Medicine | Admitting: Cardiology

## 2024-01-11 ENCOUNTER — Encounter: Payer: Self-pay | Admitting: Cardiology

## 2024-01-11 VITALS — BP 104/62 | HR 77 | Ht 72.0 in | Wt 279.8 lb

## 2024-01-11 DIAGNOSIS — G4733 Obstructive sleep apnea (adult) (pediatric): Secondary | ICD-10-CM | POA: Diagnosis not present

## 2024-01-11 DIAGNOSIS — M5459 Other low back pain: Secondary | ICD-10-CM | POA: Diagnosis not present

## 2024-01-11 DIAGNOSIS — I10 Essential (primary) hypertension: Secondary | ICD-10-CM

## 2024-01-11 DIAGNOSIS — M79604 Pain in right leg: Secondary | ICD-10-CM | POA: Diagnosis not present

## 2024-01-11 DIAGNOSIS — R202 Paresthesia of skin: Secondary | ICD-10-CM | POA: Diagnosis not present

## 2024-01-11 DIAGNOSIS — R262 Difficulty in walking, not elsewhere classified: Secondary | ICD-10-CM | POA: Diagnosis not present

## 2024-01-11 NOTE — Patient Instructions (Signed)
 Medication Instructions:  Your physician recommends that you continue on your current medications as directed. Please refer to the Current Medication list given to you today.  *If you need a refill on your cardiac medications before your next appointment, please call your pharmacy*  Lab Work: NONE If you have labs (blood work) drawn today and your tests are completely normal, you will receive your results only by: MyChart Message (if you have MyChart) OR A paper copy in the mail If you have any lab test that is abnormal or we need to change your treatment, we will call you to review the results.  Testing/Procedures: NONE  Follow-Up: At [facility], you and your health needs are our priority.  As part of our continuing mission to provide you with exceptional heart care, our providers are all part of one team.  This team includes your primary Cardiologist (physician) and Advanced Practice Providers or APPs (Physician Assistants and Nurse Practitioners) who all work together to provide you with the care you need, when you need it.  Your next appointment:   2 month(s)  Provider:   Shlomo, MD  We recommend signing up for the patient portal called MyChart.  Sign up information is provided on this After Visit Summary.  MyChart is used to connect with patients for Virtual Visits (Telemedicine).  Patients are able to view lab/test results, encounter notes, upcoming appointments, etc.  Non-urgent messages can be sent to your provider as well.   To learn more about what you can do with MyChart, go to [email or web address].

## 2024-01-11 NOTE — Progress Notes (Signed)
 Remote pacemaker transmission.

## 2024-01-12 ENCOUNTER — Telehealth: Payer: Self-pay

## 2024-01-12 DIAGNOSIS — I480 Paroxysmal atrial fibrillation: Secondary | ICD-10-CM

## 2024-01-12 DIAGNOSIS — G4733 Obstructive sleep apnea (adult) (pediatric): Secondary | ICD-10-CM

## 2024-01-12 DIAGNOSIS — I5022 Chronic systolic (congestive) heart failure: Secondary | ICD-10-CM

## 2024-01-12 DIAGNOSIS — I2782 Chronic pulmonary embolism: Secondary | ICD-10-CM

## 2024-01-12 DIAGNOSIS — I4430 Unspecified atrioventricular block: Secondary | ICD-10-CM

## 2024-01-12 DIAGNOSIS — I517 Cardiomegaly: Secondary | ICD-10-CM

## 2024-01-12 DIAGNOSIS — I428 Other cardiomyopathies: Secondary | ICD-10-CM

## 2024-01-12 DIAGNOSIS — I10 Essential (primary) hypertension: Secondary | ICD-10-CM

## 2024-01-12 DIAGNOSIS — I2510 Atherosclerotic heart disease of native coronary artery without angina pectoris: Secondary | ICD-10-CM

## 2024-01-12 NOTE — Telephone Encounter (Signed)
-----   Message from Wilbert Bihari sent at 01/11/2024  8:20 AM EDT ----- Order a nasal cushion mask to see if he does better with that and order a chin strap

## 2024-01-12 NOTE — Telephone Encounter (Signed)
 Order for nasal cushions and chin strap sent to [facility] today.

## 2024-01-21 ENCOUNTER — Telehealth (HOSPITAL_COMMUNITY): Payer: Self-pay | Admitting: Cardiology

## 2024-01-21 NOTE — Telephone Encounter (Signed)
 Pt aware and voiced understanding

## 2024-01-21 NOTE — Telephone Encounter (Signed)
 Patient called to request echo results

## 2024-01-24 DIAGNOSIS — G4733 Obstructive sleep apnea (adult) (pediatric): Secondary | ICD-10-CM | POA: Diagnosis not present

## 2024-01-24 NOTE — Telephone Encounter (Signed)
 Fax came from [facility] that they could not reach the patient for the ONO. I reached out to patient spoke to him and he states he will call [facility] this week to get set up. Contacted T Salick at [facility] to inform her.

## 2024-01-25 ENCOUNTER — Other Ambulatory Visit (HOSPITAL_COMMUNITY): Payer: Self-pay | Admitting: Cardiology

## 2024-01-25 ENCOUNTER — Other Ambulatory Visit: Payer: Self-pay | Admitting: Family Medicine

## 2024-01-25 ENCOUNTER — Ambulatory Visit: Payer: Self-pay

## 2024-01-25 DIAGNOSIS — Z419 Encounter for procedure for purposes other than remedying health state, unspecified: Secondary | ICD-10-CM | POA: Diagnosis not present

## 2024-01-25 NOTE — Telephone Encounter (Signed)
 FYI Only or Action Required?: Action required by provider: medication change.  Patient was last seen in primary care on 12/23/2023 by Chandra Toribio POUR, MD.  Called Nurse Triage reporting Pain.  Symptoms began x few week ago.  Interventions attempted: Prescription medications: neuropathy medication  and Rest, hydration, or home remedies.  Symptoms are: gradually worsening.  Triage Disposition: See PCP When Office is Open (Within 3 Days)  Patient/caregiver understands and will follow disposition?: Yes     Copied from CRM [id number]. Topic: Clinical - Red Word Triage >> Jan 25, 2024 11:32 AM Winona SAUNDERS wrote: Bad pain in his feet and would like to know if he has any other options for the pain. States he has neuropathy in his feet. Reason for Disposition  [1] MODERATE pain (e.g., interferes with normal activities) AND [2] present > 3 days  Answer Assessment - Initial Assessment Questions 1. ONSET: When did the muscle aches or body pains start?      X few weeks 2. LOCATION: What part of your body is hurting? (e.g., entire body, arms, legs)      Bilateral feet pain, burning, tingling and numbness 3. SEVERITY: How bad is the pain? (Scale 1-10; or mild, moderate, severe)     severe 4. CAUSE: What do you think is causing the pains?     neuropathy 5. FEVER: Do you have a fever? If Yes, ask: What is your temperature, how was it measured, and  when did it start?      no 6. OTHER SYMPTOMS: Do you have any other symptoms? (e.g., chest pain, cold or flu symptoms, rash, weakness, weight loss)     no 7. PREGNANCY: Is there any chance you are pregnant? When was your last menstrual period?     na 8. TRAVEL: Have you traveled out of the country in the last month? (e.g., exposures, travel history)     Na  Pt stated medication for his neuropathy are not working and would like to see if PCP could try him on something different?  Protocols used: Muscle Aches and Body Pain-A-AH

## 2024-01-26 ENCOUNTER — Other Ambulatory Visit: Payer: Self-pay | Admitting: Family Medicine

## 2024-01-26 DIAGNOSIS — R202 Paresthesia of skin: Secondary | ICD-10-CM | POA: Diagnosis not present

## 2024-01-26 DIAGNOSIS — M79604 Pain in right leg: Secondary | ICD-10-CM | POA: Diagnosis not present

## 2024-01-26 DIAGNOSIS — M5459 Other low back pain: Secondary | ICD-10-CM | POA: Diagnosis not present

## 2024-01-26 DIAGNOSIS — R262 Difficulty in walking, not elsewhere classified: Secondary | ICD-10-CM | POA: Diagnosis not present

## 2024-01-26 MED ORDER — PREGABALIN 300 MG PO CAPS: 300.0000 mg | ORAL_CAPSULE | Freq: Every day | ORAL | 0 refills | Status: DC

## 2024-01-26 NOTE — Telephone Encounter (Signed)
 There is a patch that can be placed on his feet called Qutenza that may help his neuropathy.  It has to be administered in a doctor's office and the only physicians I know who do it are the physical medicine and rehab doctors. He saw a podiatrist in 2022, Dr. Magdalen, who appears to be qualified to do this according to the [employer].   If he would like to see if this is an option he can call the [facility] to see if dr. Magdalen can do it, or I would need to send in a referral to physical medicine and rehab.

## 2024-01-26 NOTE — Telephone Encounter (Signed)
 Called patient he is agreeable to the Qutenza  he will reach out to the foot doctor he also wants to renew his Pregabalin 

## 2024-01-27 ENCOUNTER — Encounter: Payer: Self-pay | Admitting: Family Medicine

## 2024-01-27 ENCOUNTER — Ambulatory Visit (INDEPENDENT_AMBULATORY_CARE_PROVIDER_SITE_OTHER): Admitting: Family Medicine

## 2024-01-27 VITALS — BP 116/72 | HR 66 | Ht 72.0 in | Wt 273.0 lb

## 2024-01-27 DIAGNOSIS — Z7984 Long term (current) use of oral hypoglycemic drugs: Secondary | ICD-10-CM | POA: Diagnosis not present

## 2024-01-27 DIAGNOSIS — G629 Polyneuropathy, unspecified: Secondary | ICD-10-CM | POA: Diagnosis not present

## 2024-01-27 DIAGNOSIS — E1169 Type 2 diabetes mellitus with other specified complication: Secondary | ICD-10-CM | POA: Diagnosis not present

## 2024-01-27 DIAGNOSIS — H9313 Tinnitus, bilateral: Secondary | ICD-10-CM | POA: Diagnosis not present

## 2024-01-27 DIAGNOSIS — H9319 Tinnitus, unspecified ear: Secondary | ICD-10-CM | POA: Insufficient documentation

## 2024-01-27 LAB — POCT GLYCOSYLATED HEMOGLOBIN (HGB A1C): Hemoglobin A1C: 6.4 % — AB (ref 4.0–5.6)

## 2024-01-27 NOTE — Patient Instructions (Signed)
 It was nice to see you today,  We addressed the following topics today: -You should have the pregabalin  prescription at your pharmacy currently waiting for you. - You can try vibrating footplates for help with your neuropathy as well until you see the foot doctor about Qutenza.  Have a great day,  Rolan Slain, MD

## 2024-01-27 NOTE — Assessment & Plan Note (Signed)
 Reports new onset of loud, high-pitched buzzing in both ears. Exam is unremarkable. -   Educated that there is often no identifiable cause and limited treatment options. -   Discussed options such as hearing aids if hearing loss is present, or masking devices. -   No acute intervention planned at this time.

## 2024-01-27 NOTE — Assessment & Plan Note (Signed)
 Presents with increased foot pain after being without pregabalin  for one week. Pain is localized and severe, limiting mobility. He has tried various topical treatments, soaking, and heating pads without relief. His diabetes is relatively well-controlled with a recent HbA1c of 6.3. He is on maximum tolerated doses of oral agents for neuropathy (pregabalin ). -   A new prescription for pregabalin  was sent to the pharmacy. -   Advised to pick up pregabalin . -   Discussed non-pharmacologic options, including a vibrating plate for feet. -   Discussed Qutenza (capsaicin patch) as a potential treatment. He has an upcoming appointment with a podiatrist on Monday at 1:45 PM, who may offer this treatment. Counseled on the procedure, which involves in-office application and monitoring. -   Continue current management. -   Follow up with podiatry as scheduled.

## 2024-01-27 NOTE — Progress Notes (Signed)
 Acute Office Visit  Subjective:     Patient ID: Christopher Green, male    DOB: June 08, 1971, 53 y.o.   MRN: [id number]  Chief Complaint  Patient presents with   Foot Pain   Ear Problem    HPI Patient is in today for   Subjective -   Foot pain: Reports significant foot pain, . Pain is severe enough that he has been mostly lying on the couch, not wanting to move. Sleeps with Crocs on as it provides some relief. Pain worsened after stepping off something last week. -   Tinnitus: Reports a loud, high-pitched buzzing in both ears, likened to a running airplane. Denies a rhythmic or pulsatile quality.  Medications Reports taking pregabalin  300 mg, but has been out for at least a week. The prescription was written for one pill daily, but he sometimes takes more. He also takes oxycodone , which does not cause side effects. He previously took duloxetine  but discontinued it as he felt it was not effective. His wife reports he sometimes experiences hallucinations or bad dreams with gabapentin /pregabalin , leading him to stop taking it intermittently. He is not currently requesting refills on other medications.  PMH, PSH, FH, Social Hx PMHx: Diabetic neuropathy, Type 2 Diabetes Mellitus, back pain (described as not radiating from feet up). History of gout mentioned by wife, but not confirmed as a current issue. FH: Cousin is attending [employer] for medical training. Social Hx: Wife is involved in his care and manages his medications, except for his pain medications.  ROS Neurological: Positive for foot pain, burning sensation in feet consistent with neuropathy. Negative for radicular back pain. Reports nightmares/hallucinations with gabapentin /pregabalin . Ears: Positive for bilateral high-pitched tinnitus. Negative for pulsatile tinnitus. General: Reports unintentional weight loss of approximately 27 lbs since May. Denies trying to lose weight.    ROS      Objective:    BP 116/72    Pulse 66   Ht 6' (1.829 m)   Wt 273 lb (123.8 kg)   SpO2 96%   BMI 37.03 kg/m    Physical Exam Gen: alert, oriented.  Accompanied by wife.  HEENT: Examination of the left ear reveals some hard cerumen at the tip, but it is non-obstructive. Tympanic membranes appear normal bilaterally.  Results for orders placed or performed in visit on 01/27/24  POCT HgB A1C  Result Value Ref Range   Hemoglobin A1C 6.4 (A) 4.0 - 5.6 %   HbA1c POC (<> result, manual entry)     HbA1c, POC (prediabetic range)     HbA1c, POC (controlled diabetic range)          Assessment & Plan:   Peripheral polyneuropathy Assessment & Plan: Presents with increased foot pain after being without pregabalin  for one week. Pain is localized and severe, limiting mobility. He has tried various topical treatments, soaking, and heating pads without relief. His diabetes is relatively well-controlled with a recent HbA1c of 6.3. He is on maximum tolerated doses of oral agents for neuropathy (pregabalin ). -   A new prescription for pregabalin  was sent to the pharmacy. -   Advised to pick up pregabalin . -   Discussed non-pharmacologic options, including a vibrating plate for feet. -   Discussed Qutenza (capsaicin patch) as a potential treatment. He has an upcoming appointment with a podiatrist on Monday at 1:45 PM, who may offer this treatment. Counseled on the procedure, which involves in-office application and monitoring. -   Continue current management. -   Follow  up with podiatry as scheduled.   Type 2 diabetes mellitus with other specified complication, without long-term current use of insulin  (HCC) Assessment & Plan: Well-controlled with latest HbA1c of 6.3. Noted unintentional weight loss. He has a history of intolerance to Ozempic  but was counseled that Mounjaro could be an option in the future if needed, as it is sometimes better tolerated. -   Collect urine for microalbumin today. -   Continue current management. -    Follow-up appointment scheduled in October.  Orders: -     POCT glycosylated hemoglobin (Hb A1C) -     Microalbumin / creatinine urine ratio  Tinnitus of both ears Assessment & Plan: Reports new onset of loud, high-pitched buzzing in both ears. Exam is unremarkable. -   Educated that there is often no identifiable cause and limited treatment options. -   Discussed options such as hearing aids if hearing loss is present, or masking devices. -   No acute intervention planned at this time.      Return for Follow-up already on file.SABRA Toribio MARLA Chandra, MD

## 2024-01-27 NOTE — Assessment & Plan Note (Signed)
 Well-controlled with latest HbA1c of 6.3. Noted unintentional weight loss. He has a history of intolerance to Ozempic  but was counseled that Mounjaro could be an option in the future if needed, as it is sometimes better tolerated. -   Collect urine for microalbumin today. -   Continue current management. -   Follow-up appointment scheduled in October.

## 2024-01-27 NOTE — Assessment & Plan Note (Deleted)
 Presents with increased foot pain after being without pregabalin  for one week. Pain is localized and severe, limiting mobility. He has tried various topical treatments, soaking, and heating pads without relief. His diabetes is relatively well-controlled with a recent HbA1c of 6.3. He is on maximum tolerated doses of oral agents for neuropathy (pregabalin ). -   A new prescription for pregabalin  was sent to the pharmacy. -   Advised to pick up pregabalin . -   Discussed non-pharmacologic options, including a vibrating plate for feet. -   Discussed Qutenza (capsaicin patch) as a potential treatment. He has an upcoming appointment with a podiatrist on Monday at 1:45 PM, who may offer this treatment. Counseled on the procedure, which involves in-office application and monitoring. -   Continue current management. -   Follow up with podiatry as scheduled.

## 2024-01-28 LAB — MICROALBUMIN / CREATININE URINE RATIO
Creatinine, Urine: 135.4 mg/dL
Microalb/Creat Ratio: 4 mg/g{creat} (ref 0–29)
Microalbumin, Urine: 5.5 ug/mL

## 2024-01-31 ENCOUNTER — Ambulatory Visit (INDEPENDENT_AMBULATORY_CARE_PROVIDER_SITE_OTHER): Admitting: Podiatry

## 2024-01-31 ENCOUNTER — Ambulatory Visit: Payer: Self-pay | Admitting: Family Medicine

## 2024-01-31 ENCOUNTER — Other Ambulatory Visit: Payer: Self-pay | Admitting: Family Medicine

## 2024-01-31 ENCOUNTER — Ambulatory Visit (INDEPENDENT_AMBULATORY_CARE_PROVIDER_SITE_OTHER)

## 2024-01-31 DIAGNOSIS — E1142 Type 2 diabetes mellitus with diabetic polyneuropathy: Secondary | ICD-10-CM | POA: Diagnosis not present

## 2024-01-31 DIAGNOSIS — R600 Localized edema: Secondary | ICD-10-CM

## 2024-01-31 DIAGNOSIS — M7751 Other enthesopathy of right foot: Secondary | ICD-10-CM | POA: Diagnosis not present

## 2024-01-31 DIAGNOSIS — M7752 Other enthesopathy of left foot: Secondary | ICD-10-CM | POA: Diagnosis not present

## 2024-01-31 DIAGNOSIS — M76822 Posterior tibial tendinitis, left leg: Secondary | ICD-10-CM

## 2024-01-31 DIAGNOSIS — M778 Other enthesopathies, not elsewhere classified: Secondary | ICD-10-CM | POA: Diagnosis not present

## 2024-01-31 DIAGNOSIS — R262 Difficulty in walking, not elsewhere classified: Secondary | ICD-10-CM | POA: Diagnosis not present

## 2024-01-31 NOTE — Progress Notes (Unsigned)
 Chief Complaint  Patient presents with   Foot Pain    Bilateral foot pain more so in toes and lateral.   Over a year. Diabetic takes metformin , xeralto no calluses.   HPI: 53 y.o. Christopher [name] presents today patient presents with a couple concerns in the keeps bouncing back between the different symptoms wanting to make sure that all of his concerns get addressed today.  First, he notes significant pain to the inner aspect of the left midfoot/ankle area.  Does not recall any specific injury.  States he is not currently employed and is working on obtaining disability benefits.  Second, he has concern of significant diabetic neuropathy.  He states that he started pregabalin  1 week ago.  He states that this is managing it but it is not great.  He states that he is supposed to take 300 mg once daily, but he is taking 300 mg Christopher Christopher [name] times per day to get relief throughout the entire day and night.  He notes that he sees a neurologist in [place] for his back issues.  He is wanting to know if there are any other options.  States his A1c is good and his last level was 6.4 on 01/27/2024  Past Medical History:  Diagnosis Date   Alcohol abuse    Arthritis    Atrial flutter (HCC)    Cardiac sarcoidosis    Chronic pain    Diabetes mellitus without complication (HCC)    DVT (deep venous thrombosis) (HCC) 05/26/2022   LLE   Dysrhythmia    2nd degree AV Block, Type 2 s/p St. Jude/Abbott PPM Christopher Christopher [name]/4/24   Hypercoagulable state (HCC)    elevated factor VIII, persistent + lupus anticoagulant   Hyperlipidemia    Hypertension    Obesity    OSA on CPAP    mild obstructive sleep apnea with an AHI of 12.9/h and no significant central events. on auto CPAP   PE (pulmonary thromboembolism) (HCC) 05/25/2022   Peripheral polyneuropathy    Presence of permanent cardiac pacemaker    Tobacco abuse    Venous insufficiency    Past Surgical History:  Procedure Laterality Date   BRONCHIAL WASHINGS  Christopher Christopher [name]/Christopher Christopher [name]/2025   Procedure:  IRRIGATION, BRONCHUS;  Surgeon: Shelah Lamar RAMAN, MD;  Location: [facility] ENDOSCOPY;  Service: Pulmonary;;   CARDIAC CATHETERIZATION  03/25/2018   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, mLAD, DIAG, pCX, pRCA)   CARDIOVERSION N/A 09/22/2023   Procedure: CARDIOVERSION;  Surgeon: Gardenia Led, DO;  Location: [facility] INVASIVE CV LAB;  Service: Cardiovascular;  Laterality: N/A;   CARPAL TUNNEL RELEASE Left    FINE NEEDLE ASPIRATION  Christopher Christopher [name]/Christopher Christopher [name]/2025   Procedure: FINE NEEDLE ASPIRATION (FNA) LINEAR;  Surgeon: Shelah Lamar RAMAN, MD;  Location: [facility] ENDOSCOPY;  Service: Pulmonary;;   PACEMAKER IMPLANT N/A 08/17/2022   Procedure: PACEMAKER IMPLANT;  Surgeon: Waddell Danelle ORN, MD;  Location: [facility] INVASIVE CV LAB;  Service: Cardiovascular;  Laterality: N/A;   TRANSESOPHAGEAL ECHOCARDIOGRAM (CATH LAB) N/A 09/22/2023   Procedure: TRANSESOPHAGEAL ECHOCARDIOGRAM;  Surgeon: Gardenia Led, DO;  Location: [facility] INVASIVE CV LAB;  Service: Cardiovascular;  Laterality: N/A;   VIDEO BRONCHOSCOPY WITH ENDOBRONCHIAL ULTRASOUND N/A Christopher Christopher [name]/Christopher Christopher [name]/2025   Procedure: BRONCHOSCOPY, WITH EBUS;  Surgeon: Shelah Lamar RAMAN, MD;  Location: [facility] ENDOSCOPY;  Service: Pulmonary;  Laterality: N/A;   No Known Allergies Review of Systems  Neurological:  Positive for sensory change.      Physical Exam: Palpable pedal pulses noted.  There is localized edema to the medial aspect of the left midfoot/rear foot.  There is significant pain on palpation along the posterior tibial tendon on the left.  Resisted inversion of the foot aggravates pain along the posterior tibial tendon.  No gaps or nodules are noted within the posterior tibial tendon.  Decreased light touch sensation to the toes.  Intact protective sensation.  Ankle dorsiflexion less than 10 degrees with the knee extended bilateral.  No pain on palpation of the Achilles tendon or plantar aspect of the heel.  Radiographic Exam (bilateral foot, Christopher Christopher [name] weightbearing views, 01/31/2024):  Normal osseous mineralization. No fractures noted.   Moderate dorsal spurring and joint space narrowing of the right first MPJ.  Decreased calcaneal inclination angle and increased talar declination angle on lateral view.  Assessment/Plan of Care: 1. Posterior tibial tendinitis of left leg   2. Capsulitis of foot, left   Christopher Christopher [name]. Capsulitis of foot, right   4. Diabetic peripheral neuropathy (HCC)   5. Difficulty walking   6. Localized edema     Reviewed clinical and radiographic findings with the patient and his companion today.  Recommend immobilization in a pneumatic cam walker for the left posterior tibial tendinitis since it is extremely painful and he is walking with a visible limp.  We need to immobilize this tendon to allow it adequate time to heal.  He can remove the cam walker for sleeping and bathing.  But he needs to wear it at all times weightbearing.  Will wear this for at least Christopher Christopher [name] weeks.  He reviewed and signed the proof of delivery and waiver form via motion MD.  Strongly recommended he reach out to his neurologist to discuss his peripheral neuropathy.  300 mg of pregabalin  Christopher Christopher [name] times per day is not the prescribed regimen that he is supposed to be taking and he could be causing damage to the organs if he continues this for any prolonged period of time.  Cannot offer Qutenza as an option to him because we have a hold placed on this treatment due to reimbursement issues at our office.  Perhaps his neurologist may offer this as another option.  Discussed the importance of proper blood sugar control in helping decrease any exacerbation of neuropathy symptoms.  Recommended the patient stay active.  Follow-up in 4 weeks  Mykeisha Dysert D. Amilcar Reever, DPM, FACFAS [facility]     [address] [place], [place] [address]                Office [phone]  Fax [phone]

## 2024-02-03 ENCOUNTER — Other Ambulatory Visit: Payer: Self-pay | Admitting: Family Medicine

## 2024-02-03 DIAGNOSIS — E1142 Type 2 diabetes mellitus with diabetic polyneuropathy: Secondary | ICD-10-CM

## 2024-02-04 ENCOUNTER — Other Ambulatory Visit: Payer: Self-pay | Admitting: Family Medicine

## 2024-02-04 ENCOUNTER — Telehealth: Payer: Self-pay

## 2024-02-04 MED ORDER — PREGABALIN 300 MG PO CAPS: 300.0000 mg | ORAL_CAPSULE | Freq: Every day | ORAL | 0 refills | Status: DC

## 2024-02-04 NOTE — Telephone Encounter (Signed)
 Called patient he advise about the referral and Rx

## 2024-02-04 NOTE — Telephone Encounter (Signed)
-----   Message from [name] sent at 02/04/2024  6:25 AM EDT ----- I had already sent the referral before you called him so if he gets a call about it he can decline it.    Please let him know I will fill his gabapentin  but going forward it will only be filled every thirty days as a once a day prescription. ----- Message ----- From: [name], RMA Sent: 02/03/2024   8:29 AM EDT To: [name], MD  Called pt he stated that he does not need a referral the pain has stop and also he will like a refill on his pregablin ----- Message ----- From: Slain Toribio MARLA, MD Sent: 02/03/2024   7:39 AM EDT To: [facility]  Please let the patient know I have seen his podiatry note and I will send in the referral to Physical medicine and rehab regarding the qutenza patch, since his podiatrist's office is no longer doing them.    Also let him know that the tapentadol medication he asked about would not be appropriate for him as it is an opioid and he is already taking opioid medications.

## 2024-02-15 ENCOUNTER — Other Ambulatory Visit: Payer: Self-pay | Admitting: Family Medicine

## 2024-02-15 ENCOUNTER — Ambulatory Visit (INDEPENDENT_AMBULATORY_CARE_PROVIDER_SITE_OTHER): Payer: BC Managed Care – PPO

## 2024-02-15 DIAGNOSIS — I480 Paroxysmal atrial fibrillation: Secondary | ICD-10-CM

## 2024-02-17 LAB — CUP PACEART REMOTE DEVICE CHECK
Battery Remaining Longevity: 82 mo
Battery Remaining Percentage: 85 %
Battery Voltage: 2.98 V
Brady Statistic AP VP Percent: 37 %
Brady Statistic AP VS Percent: 10 %
Brady Statistic AS VP Percent: 47 %
Brady Statistic AS VS Percent: 1.8 %
Brady Statistic RA Percent Paced: 43 %
Brady Statistic RV Percent Paced: 84 %
Date Time Interrogation Session: 20250902043603
Implantable Lead Connection Status: 753985
Implantable Lead Connection Status: 753985
Implantable Lead Implant Date: 20240304
Implantable Lead Implant Date: 20240304
Implantable Lead Location: 753859
Implantable Lead Location: 753860
Implantable Pulse Generator Implant Date: 20240304
Lead Channel Impedance Value: 400 Ohm
Lead Channel Impedance Value: 430 Ohm
Lead Channel Pacing Threshold Amplitude: 1 V
Lead Channel Pacing Threshold Amplitude: 1.25 V
Lead Channel Pacing Threshold Pulse Width: 0.5 ms
Lead Channel Pacing Threshold Pulse Width: 0.5 ms
Lead Channel Sensing Intrinsic Amplitude: 0.3 mV
Lead Channel Sensing Intrinsic Amplitude: 12 mV
Lead Channel Setting Pacing Amplitude: 2.5 V
Lead Channel Setting Pacing Amplitude: 2.5 V
Lead Channel Setting Pacing Pulse Width: 0.5 ms
Lead Channel Setting Sensing Sensitivity: 2 mV
Pulse Gen Model: 2272
Pulse Gen Serial Number: 8153879

## 2024-02-20 ENCOUNTER — Ambulatory Visit: Payer: Self-pay | Admitting: Internal Medicine

## 2024-02-22 NOTE — Progress Notes (Signed)
 Remote PPM Transmission

## 2024-02-22 NOTE — Progress Notes (Unsigned)
 Cardiology Office Note:  .   Date:  02/22/2024  ID:  Christopher Green, DOB May 28, 1971, MRN [id number] PCP: Chandra Toribio POUR, MD  [facility] Providers Cardiologist:  Dub Huntsman, DO Electrophysiologist:  Danelle Birmingham, MD {  History of Present Illness: .   Christopher Green is a 53 y.o. 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 Birmingham 11/24/22, doing well, no changes were made.  He saw D. Dunn, PA 02/09/23 for recurrent syncope, suffered serious facial injuries, suspect to have been situational, perhaps cough at the hospital/ER. Pt described too many episodes to recall, no clear pattern, trigger by the note other then low BPs perhaps.he self stopped valsartan , hydrochlorothiazide  and reported no additional events since then. Device interrogation noted no findings to explain syncope, AFib CVR, A lead undersensing not new for him Orthostatics + (mild, asymptomatic) Advised to the ER given so many events and being on OAC, though he declined, planned for labs Suspected low BP Labs suggested dehydration, advised to stay off valsartan /hydrochlorothiazide  and his spironolactone  dose reduced. Discussed f/u w/EP to visit rhythm control options Also discussed concerns of ongoing a/c in the environment of recurrent syncope.   He saw CANDIE Ferrier PA 02/26/23, reported no ongoing symptoms or syncope, and was likely low BP, pressure this visit was elevated though had not taken his meds yet.  Planned to monitor at home  Subsequently had c.MRI done, D. Dunn, PA-C noted:  I discussed MRI report with the doctor that read the study. This shows severe thickening of the heart as we saw on echo, with appearance of whether this could be a process of inflammation of the  heart called sarcoidosis. Sarcoidosis can also cause heart rhythm issues leading to pacemakers which may link these in his medical history. The EF pump function was down slightly on this study but this may be due to him breathing during the study - echo recently had shown that EF was normal. Dr. Santo would recommend we get him set up for a PET sarcoid protocol study >> ordered and pending  I saw him 04/20/13 His device was already programmed VVI > and up to that programming change had been 100% AF Pt preferred to get his CM w/u underway > completed, also pending perhaps a back surgery and did not want to pursue rhythm management.  Subsequently following with the AHF team  Did undergo TEE/DCCV eventually 09/22/23 w/Dr. Nance  Last saw him 12/20/23, very limited by terrible back pain, unable to walk much, seeing neurology/neurosurgery teams He struggles with LE neuropathy pain and pedal (tip of his feet) edema, particularly as the day progresses. Meds don't seem to help He has a terrible back, w/u in progress with likely surgery coming for it W/u: Nonischemic cardiomyopathy with significant LVH.  Although he does not have the typical characteristics of cardiac sarcoid he does have patchy LGE on cardiac MRI.   Interestingly he has severe LVH that would be more consistent with hypertrophic cardiomyopathy.  Cardiac PET consistent with sarcoid.  He has now had a HRCT which has some hilar adenopathy. EBUS negative for sarcoid.   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  Discussed need to consider  PPM > ICD  Seeing Dr. Shlomo > OSA  Updated echo noted below   Today's visit is scheduled as a 6 mo visit ROS:   He reports feeling pretty good No overt symptoms or concerns Denies any CP, palpitations No dizzy spells, near syncope or syncope No rest SOB, mild DOE  Device information Abbott dual chamber PPM implanted  08/17/22 RV lead is in septal/LB area  Generator is a Assurity RA lead is LPA 1231 RV lead is LPA 1231  Arrhythmia/AAD hx Amiodarone  started 09/03/23 (AFib)  Studies Reviewed: SABRA    EKG not done today  DEVICE interrogation done today by industry and reviewed by myself and Dr. Waddell Glimpse and RV lead measurements are good RA sensing has declined, threshold is capturing in the A and stable There is a noisy signal on  the A channel, though rhythmic (and likely the TV) Very large RV signal on the atrial channel AMS EGMs reviewed are false for FF, noisy signal Some are true ATach/slow AFlutter with CVR No HVR episodes  12/30/23: TTE 1. Left ventricular ejection fraction, by estimation, is 60 to 65%. The  left ventricle has normal function. The left ventricle has no regional  wall motion abnormalities. There is moderate concentric left ventricular  hypertrophy. Left ventricular  diastolic parameters are consistent with Grade I diastolic dysfunction  (impaired relaxation).   2. Right ventricular systolic function is normal. The right ventricular  size is normal.   3. The mitral valve is normal in structure. No evidence of mitral valve  regurgitation. No evidence of mitral stenosis.   4. The aortic valve is tricuspid. There is mild calcification of the  aortic valve. Aortic valve regurgitation is not visualized. Aortic valve  sclerosis/calcification is present, without any evidence of aortic  stenosis.   5. The inferior vena cava is normal in size with greater than 50%  respiratory variability, suggesting right atrial pressure of 3 mmHg   ECHO: 02/12/23: LVEF 60 to 65% with normal RV function.  Severe LVH as per Dr Gardenia.    CMR (04/12/23): 1. Mild decrease in LVEF, LVEF 46%, in the setting of free breathing artifact.  2. There is severe hypertrophy of the LV septum. Though this meets criteria for hypertrophic cardiomyopathy, this is multi-focal late gadolinium enhancement  with increase in ECV signal; this is not present in the areas of hypertrophy. Consider PET imaging for inflammatory cardiomyopathy evaluation.   Cardiac PET (05/19/23):   FDG uptake findings are consistent with active myocardial inflammation/sarcoidosis.   FDG uptake was observed. FDG uptake was focal. FDG uptake was present in the apical to basal lateral, apical anterior and apex segment(s). LV perfusion is abnormal. Defect 1: There is a small defect with mild reduction in uptake present in the apical inferior location(s).   Left ventricular function is abnormal. Global function is moderately reduced. EF: 36%.   Coronary calcium  was present on the attenuation correction CT images. Moderate coronary calcifications were present. Coronary calcifications were present in the left anterior descending artery, left circumflex artery and right coronary artery distribution(s).   Electronically signed by Lonni Nanas, MD  Risk Assessment/Calculations:    Physical Exam:   VS:  There were no vitals taken for this visit.   Wt Readings from Last 3 Encounters:  01/27/24 273 lb (123.8 kg)  01/11/24 279 lb 12.8 oz (126.9 kg)  12/23/23 281 lb (127.5 kg)    GEN: Well nourished, well developed in no acute distress NECK: No JVD; No carotid  bruits CARDIAC: RRR, no murmurs, rubs, gallops RESPIRATORY: CTA b/l without rales, wheezing or rhonchi  ABDOMEN: Soft, non-tender, non-distended, obese EXTREMITIES: No edema is appreciated today; No deformity   PPM site: is stable, no thinning, fluctuation, tethering  ASSESSMENT AND PLAN: .    Longstanding persistent AFib CHA2DS2Vasc is 4, on Xarelto , appropriately dosed Unclear burden given false episodes  PPM RA lead by last CXR has fallen/dislodged towards TV  Pacing the atrium with stable threshold Noisy signal from TV and RV signal Programmed DDI with AV conduction (long PR)  Discussed given HF team findings > recommendation to pursue ICD (he  recalled Dr. Gardenia discussing that and potentially a life saving device) Discussed his RA lead had moved and not functioning normally Dr. Waddell saw him today Recommend extraction of his RA lead Upgrade to ICD with HV RV lead and a new RA lead Patient is agreeable His RN/scheduling team will reach out to the patient with scheduling dates  NICM HCM Sarcoid Volume stable C/w Dr. Fulton  Secondary hypercoagulable state 2/2 AFib, DVT, Pulmonary Embolism, and Antiphospholipid Antibody Syndrome     Dispo: post procedure as usual, sooner if needed  Signed, Charlies Macario Arthur, PA-C

## 2024-02-24 ENCOUNTER — Encounter: Payer: Self-pay | Admitting: Physical Medicine & Rehabilitation

## 2024-02-25 ENCOUNTER — Ambulatory Visit: Attending: Physician Assistant | Admitting: Physician Assistant

## 2024-02-25 ENCOUNTER — Encounter: Payer: Self-pay | Admitting: Physician Assistant

## 2024-02-25 VITALS — BP 118/76 | HR 66 | Ht 72.0 in | Wt 270.8 lb

## 2024-02-25 DIAGNOSIS — I4811 Longstanding persistent atrial fibrillation: Secondary | ICD-10-CM | POA: Diagnosis not present

## 2024-02-25 DIAGNOSIS — I422 Other hypertrophic cardiomyopathy: Secondary | ICD-10-CM

## 2024-02-25 DIAGNOSIS — Z950 Presence of cardiac pacemaker: Secondary | ICD-10-CM

## 2024-02-25 DIAGNOSIS — Z419 Encounter for procedure for purposes other than remedying health state, unspecified: Secondary | ICD-10-CM | POA: Diagnosis not present

## 2024-02-25 DIAGNOSIS — I428 Other cardiomyopathies: Secondary | ICD-10-CM | POA: Diagnosis not present

## 2024-02-25 DIAGNOSIS — D6869 Other thrombophilia: Secondary | ICD-10-CM

## 2024-02-25 LAB — CUP PACEART INCLINIC DEVICE CHECK
Battery Remaining Longevity: 81 mo
Battery Voltage: 2.98 V
Brady Statistic RA Percent Paced: 45 %
Brady Statistic RV Percent Paced: 85 %
Date Time Interrogation Session: 20250912181353
Implantable Lead Connection Status: 753985
Implantable Lead Connection Status: 753985
Implantable Lead Implant Date: 20240304
Implantable Lead Implant Date: 20240304
Implantable Lead Location: 753859
Implantable Lead Location: 753860
Implantable Pulse Generator Implant Date: 20240304
Lead Channel Impedance Value: 387.5 Ohm
Lead Channel Impedance Value: 412.5 Ohm
Lead Channel Pacing Threshold Amplitude: 1 V
Lead Channel Pacing Threshold Amplitude: 1 V
Lead Channel Pacing Threshold Amplitude: 1.25 V
Lead Channel Pacing Threshold Amplitude: 1.25 V
Lead Channel Pacing Threshold Pulse Width: 0.5 ms
Lead Channel Pacing Threshold Pulse Width: 0.5 ms
Lead Channel Pacing Threshold Pulse Width: 0.5 ms
Lead Channel Pacing Threshold Pulse Width: 0.5 ms
Lead Channel Sensing Intrinsic Amplitude: 0.3 mV
Lead Channel Sensing Intrinsic Amplitude: 12 mV
Lead Channel Setting Pacing Amplitude: 2.5 V
Lead Channel Setting Pacing Amplitude: 2.5 V
Lead Channel Setting Pacing Pulse Width: 0.5 ms
Lead Channel Setting Sensing Sensitivity: 2 mV
Pulse Gen Model: 2272
Pulse Gen Serial Number: 8153879

## 2024-02-25 NOTE — Patient Instructions (Addendum)
 Medication Instructions:   Your physician recommends that you continue on your current medications as directed. Please refer to the Current Medication list given to you today.    *If you need a refill on your cardiac medications before your next appointment, please call your pharmacy*    Lab Work: NONE ORDERED  TODAY     If you have labs (blood work) drawn today and your tests are completely normal, you will receive your results only by: MyChart Message (if you have MyChart) OR A paper copy in the mail If you have any lab test that is abnormal or we need to change your treatment, we will call you to review the results.   Testing/Procedures:  YOU WILL BE CONTACT BACK  BY DR WADDELL NURSE SCHEDULING TEAM FOR AN EXTRACTION DATE     Follow-Up: At [facility], you and your health needs are our priority.  As part of our continuing mission to provide you with exceptional heart care, our providers are all part of one team.  This team includes your primary Cardiologist (physician) and Advanced Practice Providers or APPs (Physician Assistants and Nurse Practitioners) who all work together to provide you with the care you need, when you need it.    Your next appointment:   IN OCTOBER WITH DR [facility])     Provider:   You may see Danelle WADDELL, MD or Charlies Arthur, PA-C ( CONTACT  CASSIE HALL/ ANGELINE HAMMER FOR EP SCHEDULING ISSUES )   We recommend signing up for the patient portal called MyChart.  Sign up information is provided on this After Visit Summary.  MyChart is used to connect with patients for Virtual Visits (Telemedicine).  Patients are able to view lab/test results, encounter notes, upcoming appointments, etc.  Non-urgent messages can be sent to your provider as well.   To learn more about what you can do with MyChart, go to [email or web address].   Other Instructions

## 2024-02-28 ENCOUNTER — Ambulatory Visit: Admitting: Podiatry

## 2024-03-02 ENCOUNTER — Other Ambulatory Visit: Payer: Self-pay | Admitting: Family Medicine

## 2024-03-02 ENCOUNTER — Telehealth: Payer: Self-pay | Admitting: Cardiology

## 2024-03-02 NOTE — Telephone Encounter (Signed)
 Christopher Green at [facility] states the patient is in the process of returning his cpap. On 9/10 patient did not mention having any problems. He never got new supplies he just wanted to return then device.

## 2024-03-02 NOTE — Telephone Encounter (Signed)
 Patient called in with concerns related to his HR this morning. Reports woke up around 5am and noted an alarm on his smart watch with heart at 49bpm, rechecked with his BP cuff machine and HR noted at 47bpm. Has been up walking this morning with rates up to the 70s. Overall, feels alittle sluggish, but no light-headedness, dizziness or syncope.   Does report yesterday his BP dropped into the 70s and felt dizzy and light-headed. Increased his fluid intake, and BP improved to 115/80 on subsequent checks.   BP in the 160s systolic this morning, has not had his morning meds.   Review of chart, notes RA lead dislodgement, but normal functioning RV lead. Programmed at DDI with base HR of 50bpm on device check 9/12. Planned for RA lead extraction and PPM upgrade to ICD per notes.   Will route to device clinic to pull remote transmission this morning and alert EP provider as well.   Patient thanked me for callback

## 2024-03-02 NOTE — Telephone Encounter (Signed)
 Contacted patient and discussed his symptoms.   He reports some heart rates in the upper 40's this am when waking up and felt some dizziness. Also noted blood pressure yesterday 60s-70/40's. He held medication and increased his fluid,salt intake.  Bp today systollic 160 and not experiencing any acute symptoms.   Device Transmission Received:  LRL programmed to DDI 50 at 02/25/24 OV (VP % dropped from 80 - 27% since Friday.  Do note small burden of HR's in the 40's on histograms.  Also note 6% PVC burden.  Atrial events are false due to [facility], however not in a ventricular tracking mode.  Known RA lead dislodgement. RV trends stable, otherwise normal device function.    Patient was not happy that his LRL was decreased to 50.  States he would not have been worried about his HR's this morning had he known that.  I explained that they likely were attempting to allow his own heart rate to come in versus pacing as this is healthier for the heart if it is appropriate.  Being that patient may be symptomatic with the changes - I offered to increase his LRL back to 60; but he refused. States he is okay now knowing this and does not want to come in for the change.    Forwarding to provider for review.  Gretta Leverne PA-C as Dr. Waddell is out of the office).   Patient states he would like to see Dr. Gardenia sooner than the end of October due to his general symptoms. Also, understands that he may be looking at a defibrillator in the place of pacemaker.     FALSE ATRIAL EVENT DUE TO [place] [employer]:

## 2024-03-03 ENCOUNTER — Ambulatory Visit: Admitting: Cardiology

## 2024-03-07 ENCOUNTER — Other Ambulatory Visit (HOSPITAL_COMMUNITY): Payer: Self-pay | Admitting: Family Medicine

## 2024-03-07 ENCOUNTER — Other Ambulatory Visit: Payer: Self-pay | Admitting: Family Medicine

## 2024-03-07 ENCOUNTER — Telehealth: Payer: Self-pay

## 2024-03-07 NOTE — Telephone Encounter (Signed)
 Called Christopher Green to schedule procedure with Dr. Waddell. He is scheduled on Friday, 11/21 at 2:30 pm for RA Lead Extraction and Dual Chamber PPM upgrade to ICD with HV RV lead and a new RA Lead (per RU note)  I offered Christopher Green 10/9 for procedure but he would like to see Dr. Gardenia first to get his opinion before he proceeds. He is scheduled to see him on 10/29. He will get labwork done on 10/29 while at the [facility].  I will send Christopher Green's Instruction letter via MyChart and mail him a copy - address verified.   Christopher Green is on Xarelto  - I will send message to Dr. Waddell to ask how long he needs to hold medication prior to his procedure.

## 2024-03-08 IMAGING — MR MR CERVICAL SPINE W/O CM
5 of 6 series · 34 of 48 positions shown · non-contrast
Comparison: None.

CLINICAL DATA: Cervical radiculitis 6KP.SD ([id number]).

EXAM:
MRI CERVICAL SPINE WITHOUT CONTRAST
TECHNIQUE: Multiplanar, multisequence MR imaging of the cervical spine was
performed. No intravenous contrast was administered.

[Series 5: T2 · sagittal · 3.0mm · 0.55mm/px · 5 of 16 slices shown (1 of 3)]
[im 1/16]
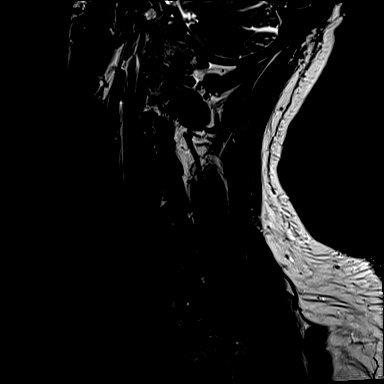
[im 4/16]
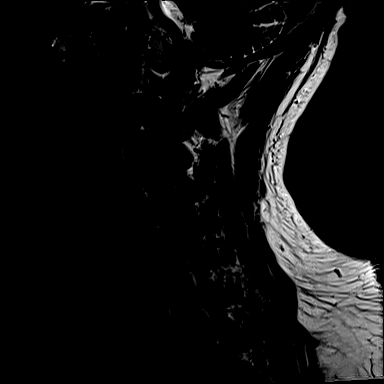
[im 8/16]
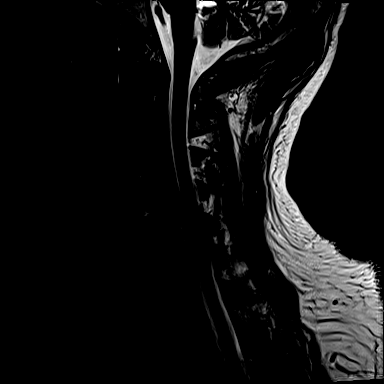
[im 12/16]
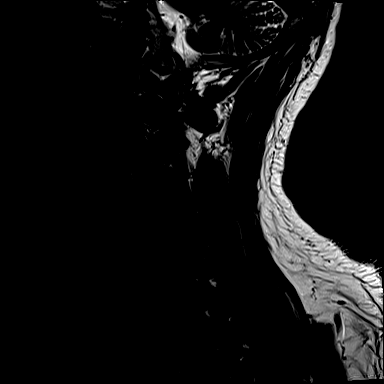
[im 16/16]
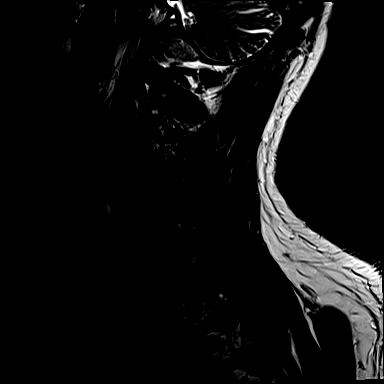

[Series 6: T1 · sagittal · 3.0mm · 0.66mm/px · 5 of 16 slices shown]
[im 1/16]
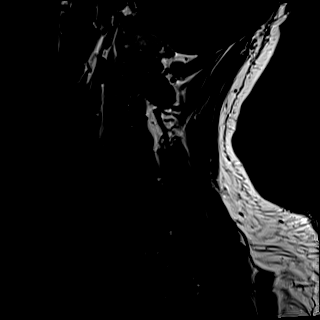
[im 4/16]
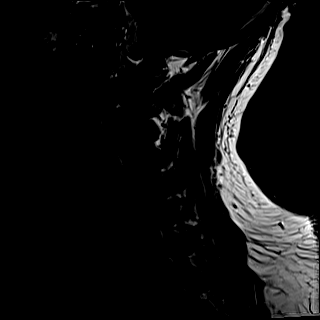
[im 8/16]
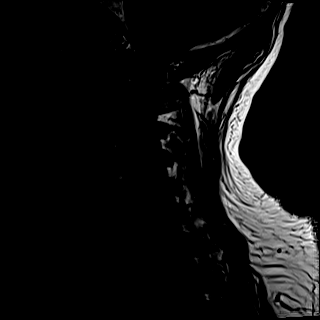
[im 12/16]
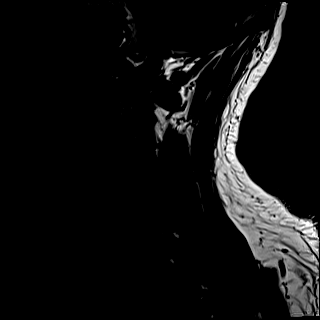
[im 16/16]
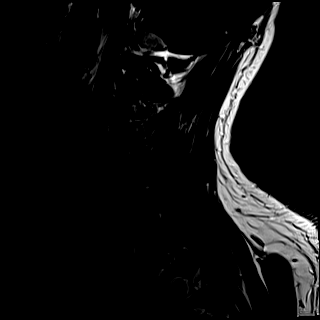

[Series 7: STIR · sagittal · 3.0mm · 0.33mm/px · 2 of 16 slices shown]
[im 1/16]
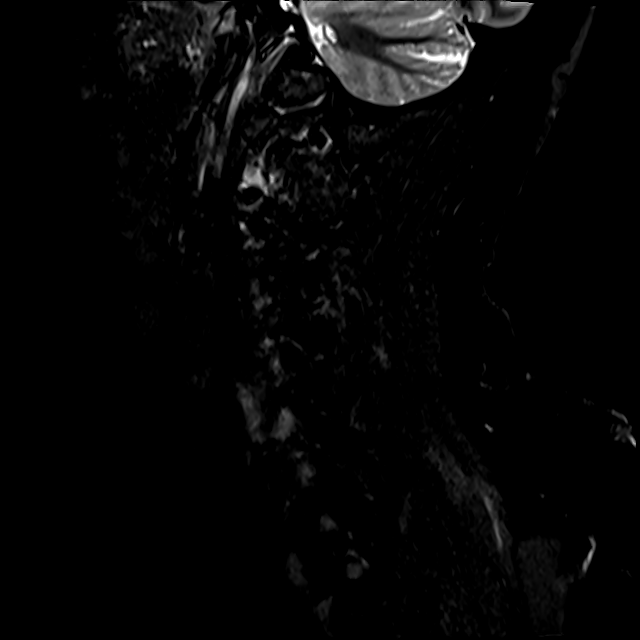
[im 4/16]
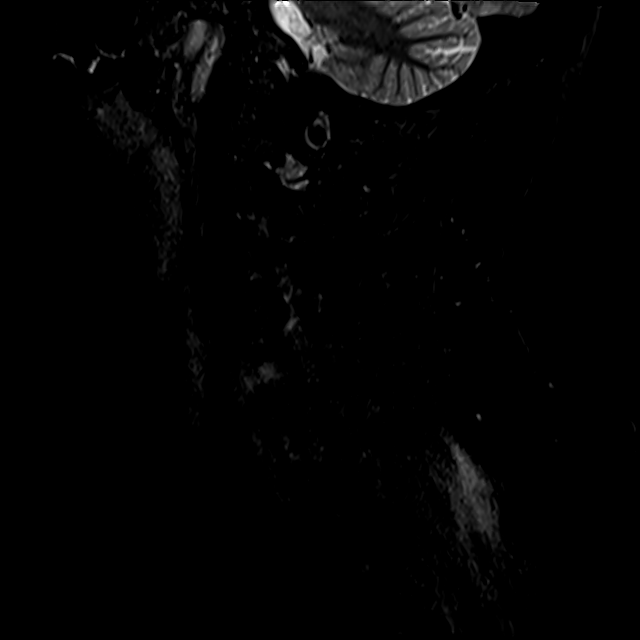

[Series 8: T2 · axial · 3.0mm · 0.50mm/px · z∈[-110,+16]mm · 11 of 40 slices shown (2 of 3)]
[im 1/40]
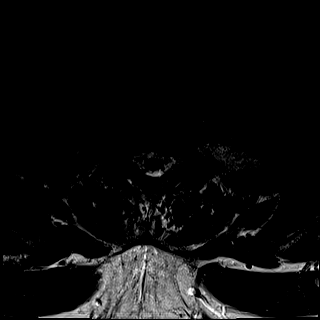
[im 4/40]
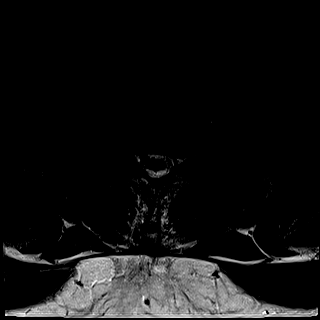
[im 8/40]
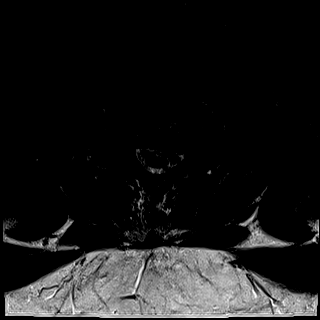
[im 12/40]
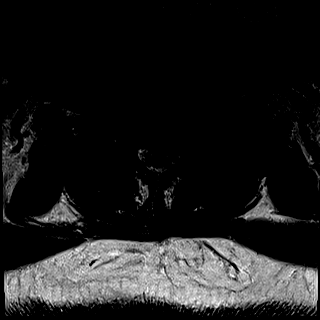
[im 16/40]
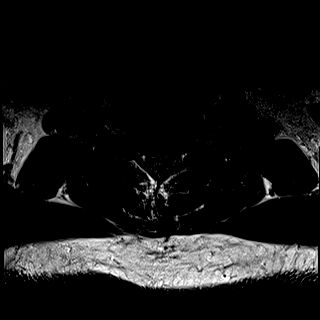
[im 20/40]
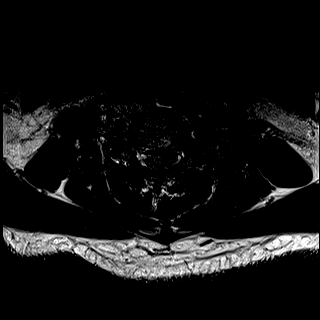
[im 24/40]
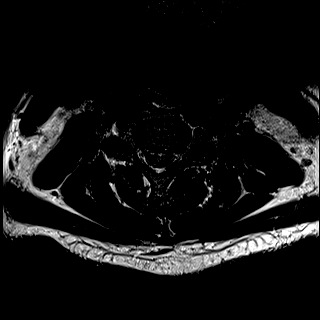
[im 28/40]
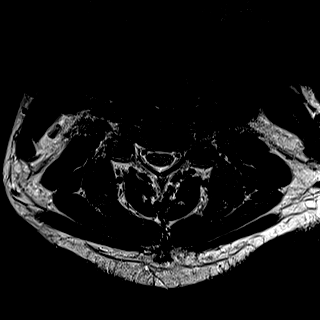
[im 32/40]
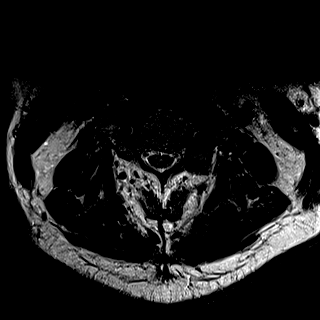
[im 36/40]
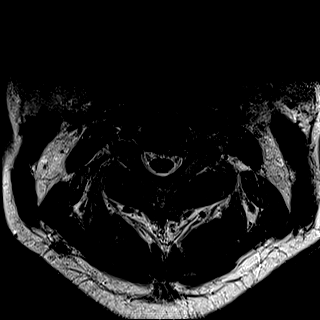
[im 40/40]
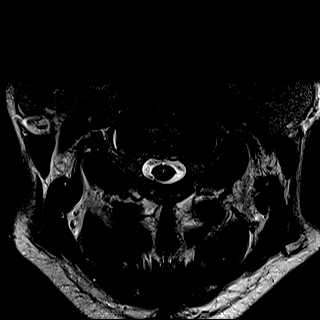

[Series 9: T2 · axial · 3.0mm · 0.50mm/px · z∈[-110,+16]mm · 11 of 40 slices shown (3 of 3)]
[im 1/40]
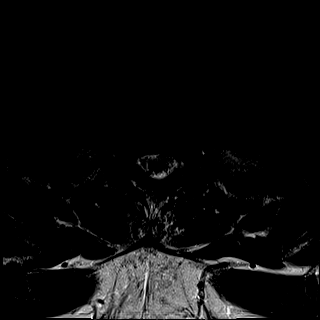
[im 4/40]
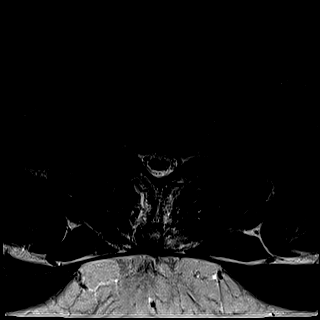
[im 8/40]
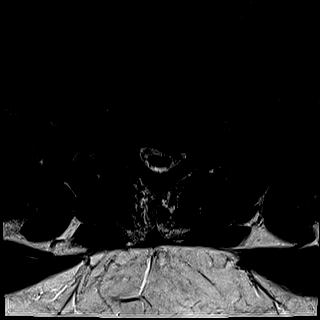
[im 12/40]
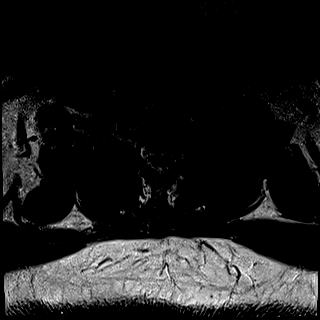
[im 16/40]
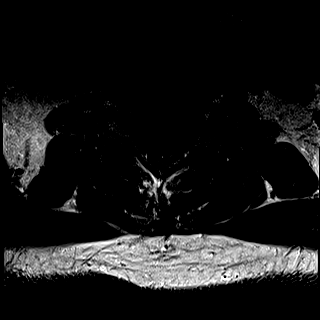
[im 20/40]
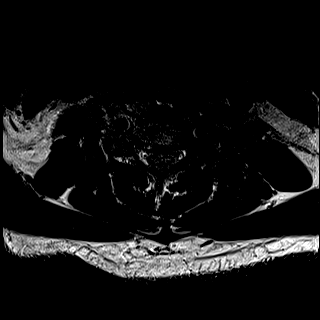
[im 24/40]
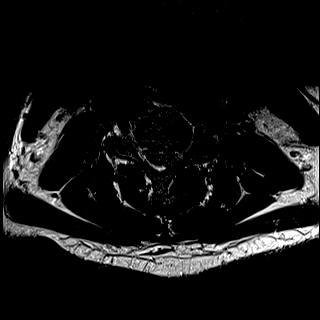
[im 28/40]
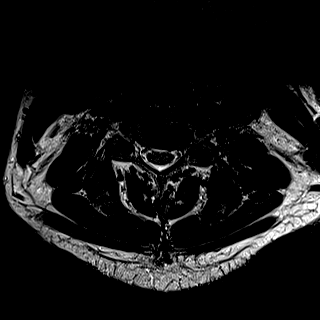
[im 32/40]
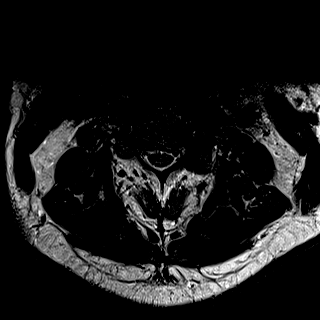
[im 36/40]
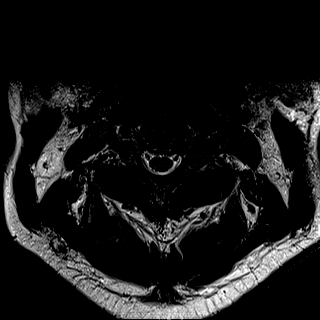
[im 40/40]
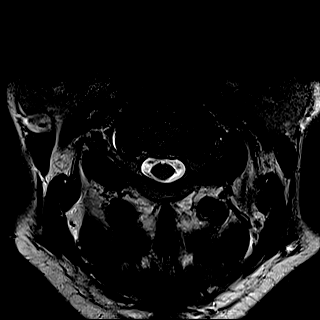

[34 of 48 positions shown; findings below may reference images not displayed]

FINDINGS: The study is partially degraded by motion.

Alignment: Straightening of the cervical curvature.

Vertebrae: Marrow edema in the tip of the C7 spinous process with
surrounding soft tissue edema. Endplate degenerative changes with
associated marrow edema at C5-6. No evidence of discitis or
aggressive bone lesion.

Cord: Mass effect on the cord at C5-6.  No cord signal abnormality.

Posterior Fossa, vertebral arteries, paraspinal tissues: Marrow
edema about the C7 spinous process.

Disc levels:

C2-3: Posterior disc protrusion without significant spinal canal
stenosis. Uncovertebral and facet degenerative changes resulting in
moderate bilateral neural foraminal narrowing, left greater than
right.

C3-4: Posterior disc osteophyte complex without significant spinal
canal stenosis. Uncovertebral and facet degenerative changes
resulting in mild bilateral neural foraminal narrowing, left greater
than right.

C4-5: Posterior disc osteophyte complex without significant spinal
canal stenosis. Uncovertebral and facet degenerative change
resulting in mild right and severe left neural foraminal narrowing.

C5-6: Posterior disc osteophyte complex, asymmetric to the right,
resulting in moderate spinal canal stenosis with mild mass effect on
the cord without cord signal abnormality. Uncovertebral and facet
degenerative changes resulting in severe bilateral neural foraminal
narrowing.

C6-7: Small posterior disc osteophyte complex without significant
spinal canal stenosis. Uncovertebral and facet degenerative changes
resulting in mild right and moderate left neural foraminal
narrowing.

C7-T1: Facet degenerative change resulting mild bilateral neural
foraminal narrowing. No significant spinal canal stenosis.
IMPRESSION: 1. Edema in the tip of the C7 spinous process with surrounding soft
tissue edema suggestive of supraspinal ligament injury.
2. Degenerative changes of the cervical spine with moderate spinal
canal stenosis at C5-6 where there is mild mass effect on the cord
without cord signal abnormality.
3. Multilevel high-grade neural foraminal narrowing, as described
above.

## 2024-03-15 ENCOUNTER — Ambulatory Visit (INDEPENDENT_AMBULATORY_CARE_PROVIDER_SITE_OTHER): Admitting: Family Medicine

## 2024-03-15 ENCOUNTER — Encounter: Payer: Self-pay | Admitting: Family Medicine

## 2024-03-15 VITALS — BP 121/70 | HR 58 | Ht 72.0 in | Wt 273.1 lb

## 2024-03-15 DIAGNOSIS — L03031 Cellulitis of right toe: Secondary | ICD-10-CM

## 2024-03-15 DIAGNOSIS — G629 Polyneuropathy, unspecified: Secondary | ICD-10-CM

## 2024-03-15 DIAGNOSIS — H9313 Tinnitus, bilateral: Secondary | ICD-10-CM

## 2024-03-15 MED ORDER — DOXYCYCLINE HYCLATE 100 MG PO TABS: 100.0000 mg | ORAL_TABLET | Freq: Two times a day (BID) | ORAL | 0 refills | Status: AC

## 2024-03-15 NOTE — Assessment & Plan Note (Signed)
 Ongoing buzzing in ears. Discussed that this condition is often chronic and has limited treatment options. Hearing aids can sometimes help if there is associated hearing loss, but reports hearing is good. - No change in management.

## 2024-03-15 NOTE — Patient Instructions (Signed)
 It was nice to see you today,  We addressed the following topics today: -I sending in a prescription for doxycycline to treat for a potential infection of your foot. - Take it twice a day for 14 days. - If it is getting worse please let us  know before I see you again in 3 weeks. - I would also like you to soak it in warm soapy water for about 10 minutes a day  Have a great day,  Rolan Slain, MD

## 2024-03-15 NOTE — Assessment & Plan Note (Signed)
 Longstanding issue with limited relief from previous treatments. Discussed options including patches, which are done at [facility], and vibrating plates. Continues current oral medications. - Continue current management.

## 2024-03-15 NOTE — Assessment & Plan Note (Signed)
 Right foot cellulitis: Presents with redness on the right foot which has worsened over the last day. No associated pain due to underlying peripheral neuropathy. No known injury. Objective findings include erythema. - Prescribed Doxycycline 100mg  twice daily for 14 days. - Advised to soak foot in warm, soapy water for 10 minutes daily using antibacterial soap. - Instructed to keep the area clean and apply a bandage if it begins to drain or bleed. - Follow up in 3 weeks to re-evaluate. Advised to contact office if symptoms worsen.

## 2024-03-15 NOTE — Progress Notes (Unsigned)
   Acute Office Visit  Subjective:     Patient ID: Christopher Green, male    DOB: 1971/02/02, 53 y.o.   MRN: [id number]  Chief Complaint  Patient presents with   Toe Injury    HPI Patient is in today for   Subjective - Right foot redness. Started Sunday, worsened last night. Reports no pain. Denies known trauma.  - Peripheral neuropathy. Ongoing issue. Previous podiatry visit confirmed torn ligaments in the past, required wearing a boot for a week or two. Discussed treatment options including patches (referred to [facility]), oral medications (previously tried all), and vibrating plates.  - Tinnitus. Ongoing buzzing in ears, described as being on an airplane. No sensation of ears being stopped up. Reports it can be very bothersome.  Medications On oral medications for neuropathy (unspecified). Prescribed Doxycycline 100mg  twice daily for 14 days for foot infection.  PMH, PSH, FH, Social Hx PMHx: Peripheral neuropathy, tinnitus, rare blood disease. Smokes cigarettes, including one prior to the appointment.  ROS Ears: Reports constant buzzing (tinnitus). Denies ears feeling stopped up. Extremities: Reports redness of the right foot. Denies pain due to underlying neuropathy.   ROS      Objective:    BP 121/70   Pulse (!) 58   Ht 6' (1.829 m)   Wt 273 lb 1.9 oz (123.9 kg)   SpO2 98%   BMI 37.04 kg/m  {Vitals History (Optional):23777}  Physical Exam Gen: alert, oriented Pulm: no respiratory distress EXTREMITIES: Redness noted on the great toe of right foot, concerning for cellulitis.  Psych: pleasant affect  No results found for any visits on 03/15/24.      Assessment & Plan:   Peripheral polyneuropathy Assessment & Plan: Longstanding issue with limited relief from previous treatments. Discussed options including patches, which are done at [facility], and vibrating plates. Continues current oral medications. - Continue current  management.   Tinnitus of both ears Assessment & Plan: Ongoing buzzing in ears. Discussed that this condition is often chronic and has limited treatment options. Hearing aids can sometimes help if there is associated hearing loss, but reports hearing is good. - No change in management.   Cellulitis of great toe of right foot Assessment & Plan: Right foot cellulitis: Presents with redness on the right foot which has worsened over the last day. No associated pain due to underlying peripheral neuropathy. No known injury. Objective findings include erythema. - Prescribed Doxycycline 100mg  twice daily for 14 days. - Advised to soak foot in warm, soapy water for 10 minutes daily using antibacterial soap. - Instructed to keep the area clean and apply a bandage if it begins to drain or bleed. - Follow up in 3 weeks to re-evaluate. Advised to contact office if symptoms worsen.   Other orders -     Doxycycline Hyclate; Take 1 tablet (100 mg total) by mouth 2 (two) times daily for 14 days.  Dispense: 28 tablet; Refill: 0     Return if symptoms worsen or fail to improve.  [name], MD

## 2024-03-26 DIAGNOSIS — Z419 Encounter for procedure for purposes other than remedying health state, unspecified: Secondary | ICD-10-CM | POA: Diagnosis not present

## 2024-03-30 ENCOUNTER — Encounter: Payer: Self-pay | Admitting: Physical Medicine & Rehabilitation

## 2024-03-30 ENCOUNTER — Encounter: Attending: Physical Medicine & Rehabilitation | Admitting: Physical Medicine & Rehabilitation

## 2024-03-30 VITALS — BP 120/72 | HR 65 | Ht 72.0 in | Wt 272.0 lb

## 2024-03-30 DIAGNOSIS — G629 Polyneuropathy, unspecified: Secondary | ICD-10-CM | POA: Diagnosis not present

## 2024-03-30 DIAGNOSIS — G894 Chronic pain syndrome: Secondary | ICD-10-CM | POA: Insufficient documentation

## 2024-03-30 MED ORDER — AMITRIPTYLINE HCL 10 MG PO TABS: 10.0000 mg | ORAL_TABLET | Freq: Every day | ORAL | 3 refills | Status: AC

## 2024-03-30 MED ORDER — PREGABALIN 200 MG PO CAPS
200.0000 mg | ORAL_CAPSULE | Freq: Every day | ORAL | 2 refills | Status: AC
Start: 2024-03-30 — End: ?

## 2024-03-30 NOTE — Progress Notes (Signed)
 Subjective:    Patient ID: Christopher Green, male    DOB: Jul 15, 1970, 53 y.o.   MRN: [id number]  HPI Discussed the use of AI scribe software for clinical note transcription with the patient, who gave verbal consent to proceed.   Did not have EPIC access during initial visit due to IT issue.   Presents for evaluation neuropathic pain in both feet. Reports burning pain, which is worse at night. Describes the pain as severe. Denies tingling. States feet also feel numb. The onset of pain is intermittent and has been present for years, but the current episode is persistent. Reports a history of a torn tendon in the left ankle, for which he wore a boot. Reports a 50+ pound weight loss, from 315 lbs to 269 lbs, through diet modification. States there is no clear trigger for pain flare-ups.  Past Medical History - Neuropathy: Diagnosed by trauma care and cardiology. - Diabetes: Unsure of diagnosis but is on metformin . Last A1C was 6.5-6.6. Reports a past A1C of 12-13 during an episode of [facility] spotted fever. - Spinal conditions: Spinal stenosis from L1-S1, requiring fusion. Neck issues also noted. - Cardiac: History of pacemaker, scheduled for replacement on 05/05/2024. - Other: History of [place] spotted fever.  Medications - Reports taking pregabalin  (Lyrica ) 300 mg daily, but admits to trying it more frequently for pain relief on occasion. Denies significant sleepiness with Lyrica . - Oxycodone : Prescribed by PCP, Dr. Flynn, for spine-related pain. Takes as needed. - Metformin : Twice dail - He reports a history of taking gabapentin  and duloxetine  (Cymbalta ) without benefit for his nerve pain. Gabapentin  caused excessive sleepiness. - Has not tried nortriptyline or amitriptyline. - Has tried topical creams (e.g., Neriva) without significant relief. - Has a TENS unit, previously used for back pain, but not used on feet.    Pain Inventory Average Pain 8 Pain Right Now 4 My  pain is constant, sharp, burning, dull, stabbing, tingling, and aching  In the last 24 hours, has pain interfered with the following? General activity 9 Relation with others 10 Enjoyment of life 9 What TIME of day is your pain at its worst? night Sleep (in general) Poor  Pain is worse with: inactivity Pain improves with: medication Relief from Meds: 1  walk without assistance use a cane how many minutes can you walk? unknown ability to climb steps?  yes do you drive?  yes Do you have any goals in this area?  yes  not employed: date last employed applied  Do you have any goals in this area?  yes  weakness numbness tremor tingling trouble walking dizziness  Any changes since last visit?  yes x-rays [facility]  Any changes since last visit?  no    Family History  Problem Relation Age of Onset   Heart attack Father    Social History   Socioeconomic History   Marital status: Married    Spouse name: Not on file   Number of children: Not on file   Years of education: Not on file   Highest education level: Not on file  Occupational History   Not on file  Tobacco Use   Smoking status: Every Day    Types: Cigarettes    Passive exposure: Current   Smokeless tobacco: Never   Tobacco comments:    1ppd 08/26/23  Vaping Use   Vaping status: Never Used  Substance and Sexual Activity   Alcohol use: Yes    Comment: daily  Drug use: Never   Sexual activity: Not on file  Other Topics Concern   Not on file  Social History Narrative   Not on file   Social Drivers of Health   Financial Resource Strain: High Risk (03/15/2024)   Overall Financial Resource Strain (CARDIA)    Difficulty of Paying Living Expenses: Very hard  Food Insecurity: Food Insecurity Present (03/15/2024)   Hunger Vital Sign    Worried About Running Out of Food in the Last Year: Often true    Ran Out of Food in the Last Year: Patient declined  Transportation Needs: Patient Declined  (03/15/2024)   PRAPARE - Administrator, Civil Service (Medical): Patient declined    Lack of Transportation (Non-Medical): Patient declined  Physical Activity: Unknown (03/15/2024)   Exercise Vital Sign    Days of Exercise per Week: Patient declined    Minutes of Exercise per Session: Not on file  Stress: Stress Concern Present (03/15/2024)   [employer] of Occupational Health - Occupational Stress Questionnaire    Feeling of Stress: Very much  Social Connections: Unknown (03/15/2024)   Social Connection and Isolation Panel    Frequency of Communication with Friends and Family: Patient declined    Frequency of Social Gatherings with Friends and Family: Patient declined    Attends Religious Services: Patient declined    Database administrator or Organizations: Patient declined    Attends Banker Meetings: Not on file    Marital Status: Patient declined   Past Surgical History:  Procedure Laterality Date   BRONCHIAL WASHINGS  08/16/2023   Procedure: IRRIGATION, BRONCHUS;  Surgeon: Shelah Lamar RAMAN, MD;  Location: [facility] ENDOSCOPY;  Service: Pulmonary;;   CARDIAC CATHETERIZATION  03/25/2018   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, mLAD, DIAG, pCX, pRCA)   CARDIOVERSION N/A 09/22/2023   Procedure: CARDIOVERSION;  Surgeon: Gardenia Led, DO;  Location: [facility] INVASIVE CV LAB;  Service: Cardiovascular;  Laterality: N/A;   CARPAL TUNNEL RELEASE Left    FINE NEEDLE ASPIRATION  08/16/2023   Procedure: FINE NEEDLE ASPIRATION (FNA) LINEAR;  Surgeon: Shelah Lamar RAMAN, MD;  Location: [facility] ENDOSCOPY;  Service: Pulmonary;;   PACEMAKER IMPLANT N/A 08/17/2022   Procedure: PACEMAKER IMPLANT;  Surgeon: Waddell Danelle ORN, MD;  Location: [facility] INVASIVE CV LAB;  Service: Cardiovascular;  Laterality: N/A;   TRANSESOPHAGEAL ECHOCARDIOGRAM (CATH LAB) N/A 09/22/2023   Procedure: TRANSESOPHAGEAL ECHOCARDIOGRAM;  Surgeon: Gardenia Led, DO;  Location: [facility] INVASIVE CV LAB;  Service: Cardiovascular;  Laterality: N/A;    VIDEO BRONCHOSCOPY WITH ENDOBRONCHIAL ULTRASOUND N/A 08/16/2023   Procedure: BRONCHOSCOPY, WITH EBUS;  Surgeon: Shelah Lamar RAMAN, MD;  Location: [facility] ENDOSCOPY;  Service: Pulmonary;  Laterality: N/A;   Past Medical History:  Diagnosis Date   Alcohol abuse    Arthritis    Atrial flutter (HCC)    Cardiac sarcoidosis    Chronic pain    Diabetes mellitus without complication (HCC)    DVT (deep venous thrombosis) (HCC) 05/26/2022   LLE   Dysrhythmia    2nd degree AV Block, 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 on auto CPAP   PE (pulmonary thromboembolism) (HCC) 05/25/2022   Peripheral polyneuropathy    Presence of permanent cardiac pacemaker  Tobacco abuse    Venous insufficiency    BP 120/72   Pulse 65   Ht 6' (1.829 m)   Wt 272 lb (123.4 kg)   SpO2 96%   BMI 36.89 kg/m   Opioid Risk Score:   Fall Risk Score:  `1  Depression screen [facility] 2/9     03/30/2024    9:56 AM 12/23/2023    9:24 AM 08/10/2023    9:44 AM 05/24/2023    2:34 PM 05/07/2023    9:26 AM 02/01/2023    8:55 AM 12/01/2022    8:49 AM  Depression screen PHQ 2/9  Decreased Interest 1 2 2  0 1 3 2   Down, Depressed, Hopeless 1 2 2  0 1 1 0  PHQ - 2 Score 2 4 4  0 2 4 2   Altered sleeping 3 2 2  0 3 1 2   Tired, decreased energy 3 2 2  0 3 2 3   Change in appetite 3 2 2  0 3 2 2   Feeling bad or failure about yourself  2 2 3  0 2 1 0  Trouble concentrating 1 2 2  0 2 1 0  Moving slowly or fidgety/restless 0 2 1 0 2 2 0  Suicidal thoughts 0 2 0 0 0 0 0  PHQ-9 Score 14 18 16  0 17 13 9   Difficult doing work/chores   Very difficult   Not difficult at all Very difficult    Review of Systems  Musculoskeletal:  Positive for back pain and neck pain.       Pain both feet, Left ankle pain, left shoulder  pain  All other systems reviewed and are negative.      Objective:   Physical Exam   Gen: no distress, normal appearing HEENT: oral mucosa pink and moist, NCAT Chest: normal effort, normal rate of breathing Abd: soft, non-distended Ext: no edema Psych: pleasant, normal affect Skin: intact, no open wounds noted Neuro: Alert and awake, follows commands, cranial nerves II through XII grossly intact, normal speech and language RUE: 5/5 Deltoid, 5/5 Biceps, 5/5 Triceps, 5/5 Wrist Ext, 5/5 Grip LUE: 5/5 Deltoid, 5/5 Biceps, 5/5 Triceps, 5/5 Wrist Ext, 5/5 Grip RLE: HF 5/5, KE 5/5, ADF 5/5, APF 5/5 LLE: HF 5/5, KE 5/5, ADF 5/5, APF 5/5 Sensory exam normal for light touch and pain in all 4 limbs. No limb ataxia or cerebellar signs. No abnormal tone appreciated.   Musculoskeletal: Minimal foot ankle tenderness/joint swelling - Sensation:   - Reports numbness to LT in the feet and up to the ankles   - Sensation reported as more normal above the ankles.        Assessment & Plan:   ASSESSMENT 1.  Peripheral Neuropathy: Chronic, severe burning pain in bilateral feet, worse at night, with associated numbness. 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 A1C is controlled on metformin . 2.  Chronic Back and Neck Pain: History of spinal stenosis and fusion. Managed with oxycodone  by PCP. 3.  Refractory Pain: Has failed or had inadequate response to multiple neuropathic agents including pregabalin  (at current prescribed dose), gabapentin , and duloxetine .  PLAN 1.  Qutenza (capsaicin 8%) patch: Discussed this treatment option. It is an in-office application every 3 months. Explained the mechanism, potential benefits (which may increase with subsequent treatments), and risks (e.g., temporary burning sensation, rash). He is agreeable to proceeding. A brochure was provided.   -Discussed Qutenza as an option for neuropathic pain control. Discussed that this is a capsaicin patch,  stronger than capsaicin  cream. Discussed that it is currently approved for diabetic peripheral neuropathy and post-herpetic neuralgia, but that it has also shown benefit in treating other forms of neuropathy. Provided patient with link to site to learn more about the patch: https://www.clark.biz/. Discussed that the patch would be placed in office and benefits usually last 3 months. Discussed that unintended exposure to capsaicin can cause severe irritation of eyes, mucous membranes, respiratory tract, and skin, but that Qutenza is a local treatment and does not have the systemic side effects of other nerve medications. Discussed that there may be pain, itching, erythema, and decreased sensory function associated with the application of Qutenza. Side effects usually subside within 1 week. A cold pack of analgesic medications can help with these side effects. Blood pressure can also be increased due to pain associated with administration of the patch.  We are recommending Qutenza 8% capsaicin to treat this patient's pain. Qutenza is the first-line treatment option recommended for diabetic peripheral neuropathy by the AACE and ADA.   Qutenza is a safer option for this patient due to the following: Gabapentin  use has been associated with increased risk of dementia Lyrica  use has been associated with increased risk of heart failure Cymblata, Venlafaxine, and other SSRIs/SNRIs are associated with increased weight gain Lidocaine  5% has been prescribed/tried   2.  Pregabalin  (Lyrica ): Will adjust to 200 mg twice daily. Counseled on the importance of adhering to the prescribed dosage to avoid running out of medication early and to maintain consistent therapeutic levels. Kidney function is reportedly normal. 3.  Nortriptyline: Will start a low dose of nortriptyline 10mg  at night to target neuropathic pain. Discussed that this is an antidepressant class medication also used for nerve pain and may cause  sleepiness.  4.  Oxycodone : Will continue to be managed by his primary care physician at this time. No changes made to this medication. 5.  Spinal Cord Stimulator: Mentioned as a future, more invasive option for refractory nerve and back pain if less invasive treatments, including Qutenza, are not effective.   Schedule for Qutenza patch application pending insurance approval. Will monitor the response to medication adjustments at that time.

## 2024-03-31 ENCOUNTER — Other Ambulatory Visit: Payer: Self-pay | Admitting: Family Medicine

## 2024-04-03 ENCOUNTER — Telehealth: Payer: Self-pay

## 2024-04-03 NOTE — Telephone Encounter (Signed)
 Copied from CRM #[id number]. Topic: Clinical - Prescription Issue >> Apr 03, 2024  9:46 AM Winona R wrote: Pt calling to check on his refill for  oxyCODONE -acetaminophen  (PERCOCET) 10-325 MG tablet [[id number]] requested by the pharmacy on 10/17

## 2024-04-05 ENCOUNTER — Ambulatory Visit: Admitting: Family Medicine

## 2024-04-05 ENCOUNTER — Ambulatory Visit (INDEPENDENT_AMBULATORY_CARE_PROVIDER_SITE_OTHER): Admitting: Physician Assistant

## 2024-04-05 VITALS — BP 102/59 | HR 57 | Ht 72.0 in | Wt 273.8 lb

## 2024-04-05 VITALS — BP 108/60 | Wt 273.0 lb

## 2024-04-05 DIAGNOSIS — M5441 Lumbago with sciatica, right side: Secondary | ICD-10-CM

## 2024-04-05 DIAGNOSIS — M47816 Spondylosis without myelopathy or radiculopathy, lumbar region: Secondary | ICD-10-CM

## 2024-04-05 DIAGNOSIS — M48062 Spinal stenosis, lumbar region with neurogenic claudication: Secondary | ICD-10-CM

## 2024-04-05 DIAGNOSIS — G8929 Other chronic pain: Secondary | ICD-10-CM

## 2024-04-05 DIAGNOSIS — M5442 Lumbago with sciatica, left side: Secondary | ICD-10-CM | POA: Diagnosis not present

## 2024-04-05 DIAGNOSIS — Z7984 Long term (current) use of oral hypoglycemic drugs: Secondary | ICD-10-CM | POA: Diagnosis not present

## 2024-04-05 DIAGNOSIS — L03031 Cellulitis of right toe: Secondary | ICD-10-CM | POA: Diagnosis not present

## 2024-04-05 DIAGNOSIS — E1169 Type 2 diabetes mellitus with other specified complication: Secondary | ICD-10-CM

## 2024-04-05 MED ORDER — METHYLPREDNISOLONE 4 MG PO TBPK: ORAL_TABLET | ORAL | 0 refills | Status: DC

## 2024-04-05 NOTE — Assessment & Plan Note (Addendum)
-   Patient seen by PM&R for nerve pain and was prescribed amitriptyline, which they are hesitant to take due to side effects (somnolence) and concerns about their heart condition.  Patient currently taking pregabalin . EKG from July 2025 reviewed. - Reassured that a low dose of amitriptyline is unlikely to cause issues, especially as they are no longer taking duloxetine , which could lead to serotonin syndrome if combined. - Advised to continue trying amitriptyline at the low dose.

## 2024-04-05 NOTE — Assessment & Plan Note (Signed)
-   Previously presented with cellulitis of the toe, which was swollen and discolored. - Now reports resolution of symptoms and examination confirms improvement. - Continue current treatment.

## 2024-04-05 NOTE — Progress Notes (Signed)
   Established Patient Office Visit  Subjective   Patient ID: Christopher Green, male    DOB: 1971-06-12  Age: 53 y.o. MRN: [id number]  Chief Complaint  Patient presents with   Medical Management of Chronic Issues    HPI  Subjective - Follow-up for cellulitis on toe. Reports it is better. Was previously dark black and blue, swollen. No longer hurts. - Discussed new medication from physical medicine and rehab doctor for nerve pain. States it makes them waste the day and causes excessive sleepiness (woke up at 10 AM). Expressed concern about taking it due to heart problems.  Medications Current medications discussed include Lyrica  200mg  twice daily, Qutenza patch (from PM&R), and a new prescription for amitriptyline. Patient is not taking duloxetine  (Cymbalta ). Takes Prozac.  PMH, PSH, FH, Social Hx PMHx: Heart failure, nerve pain. Scheduled for a defibrillator placement on 05/05/2024 due to a bad wire.  ROS Constitutional: Denies fever, chills. Reports excessive somnolence after taking one dose of amitriptyline. Integumentary: Reports resolving swelling and discoloration of toe. Neurological: Reports nerve pain.   The 10-year ASCVD risk score (Arnett DK, et al., 2019) is: 16.1%  Health Maintenance Due  Topic Date Due   COVID-19 Vaccine (1) Never done   OPHTHALMOLOGY EXAM  Never done   Hepatitis C Screening  Never done   Hepatitis B Vaccines 19-59 Average Risk (1 of 3 - 19+ 3-dose series) Never done      Objective:     BP (!) 102/59   Pulse (!) 57   Ht 6' (1.829 m)   Wt 273 lb 12.8 oz (124.2 kg)   SpO2 97%   BMI 37.13 kg/m    Physical Exam Gen: alert, oriented Pulm: no respiratory distress Psych: pleasant affect SKIN: Toe cellulitis has improved. No longer swollen or discolored.   No results found for any visits on 04/05/24.      Assessment & Plan:   Cellulitis of great toe of right foot Assessment & Plan: - Previously presented with cellulitis of the  toe, which was swollen and discolored. - Now reports resolution of symptoms and examination confirms improvement. - Continue current treatment.  Orders: -     CBC with Differential/Platelet; Future  Chronic bilateral low back pain with bilateral sciatica Assessment & Plan: - Patient seen by PM&R for nerve pain and was prescribed amitriptyline, which they are hesitant to take due to side effects (somnolence) and concerns about their heart condition.  Patient currently taking pregabalin . EKG from July 2025 reviewed. - Reassured that a low dose of amitriptyline is unlikely to cause issues, especially as they are no longer taking duloxetine , which could lead to serotonin syndrome if combined. - Advised to continue trying amitriptyline at the low dose.    Type 2 diabetes mellitus with other specified complication, without long-term current use of insulin  (HCC) -     Comprehensive metabolic panel with GFR; Future -     Hemoglobin A1c; Future -     Lipid panel; Future -     Microalbumin / creatinine urine ratio; Future     Return in about 3 months (around 07/06/2024) for DM.    Christopher MARLA Slain, MD

## 2024-04-05 NOTE — Patient Instructions (Signed)
 It was nice to see you today,  We addressed the following topics today: - Please make sure you are not taking duloxetine  (Cymbalta ). Check your medications at home to ensure it is not in your pillbox. I have highlighted it on your medication list. - I would like you to try taking the amitriptyline as prescribed,  - If you need anything before your next scheduled appointment, please let me know.  Have a great day,  Rolan Slain, MD

## 2024-04-05 NOTE — Progress Notes (Unsigned)
 Referring Physician:  Chandra Toribio POUR, MD [address] [place],  [place] [address]  Primary Physician:  Christopher Toribio POUR, MD  History of Present Illness: [name] has a history of HTN, atrial flutter, venous insufficiency, DM, peripheral polyneuropathy, hyperlipidemia, obesity, and chronic pain.  He has a pacemaker.  He is currently having acute on chronic pain after a fall 2 weeks ago trying to get off his boat and when she fell directly on his back.  He states he is having increased numbness in bilateral lower extremities.  He states that his legs feel very heavy and he can only walk about 100 feet which is a change since the fall and as soon as he starts walking his legs go numb.  He has some relief while leaning forward, but he is in quite severe pain despite Lyrica  and oxycodone .  No radiating shooting pain down his legs.  One  leg is not worse than the other.  04/05/24 Patient seen today in follow-up.  He has undergone physical therapy.  Continues to have back pain with numbness and his legs especially when standing for prolonged period of time or walking.  He states however that his back hurts him more than his legs and he has flares about once a week.  He is taking Lyrica  and oxycodone .  Unfortunately he has continued to smoke.  In addition, he is having his pacemaker replaced next month.    Bowel/Bladder Dysfunction: none  Conservative measures:  Physical therapy: None Multimodal medical therapy including regular antiinflammatories: Oxycodone , Gabapentin , Tylenol , Ibuprofen, cymbalta   Injections:  Has done ESIs in past with relief  Past Surgery: no  Christopher Green has no symptoms of cervical myelopathy. He has bilateral carpal tunnel, had surgery on the left.   The symptoms are causing a significant impact on the patient's life.   Review of Systems:  A 10 point review of systems is negative, except for the pertinent positives and negatives detailed in the  HPI.  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 on auto CPAP   PE (pulmonary thromboembolism) (HCC) 05/25/2022   Peripheral polyneuropathy    Presence of permanent cardiac pacemaker    Tobacco abuse    Venous insufficiency     Past Surgical History: Past Surgical History:  Procedure Laterality Date   BRONCHIAL WASHINGS  08/16/2023   Procedure: IRRIGATION, BRONCHUS;  Surgeon: Christopher Christopher Green RAMAN, MD;  Location: [facility] ENDOSCOPY;  Service: Pulmonary;;   CARDIAC CATHETERIZATION  03/25/2018   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, mLAD, DIAG, pCX, pRCA)   CARDIOVERSION N/A 09/22/2023   Procedure: CARDIOVERSION;  Surgeon: Christopher Led, DO;  Location: [facility] INVASIVE CV LAB;  Service: Cardiovascular;  Laterality: N/A;   CARPAL TUNNEL RELEASE Left    FINE NEEDLE ASPIRATION  08/16/2023   Procedure: FINE NEEDLE ASPIRATION (FNA) LINEAR;  Surgeon: Christopher,  Christopher Green RAMAN, MD;  Location: [facility] ENDOSCOPY;  Service: Pulmonary;;   PACEMAKER IMPLANT N/A 08/17/2022   Procedure: PACEMAKER IMPLANT;  Surgeon: Christopher Christopher ORN, MD;  Location: [facility] INVASIVE CV LAB;  Service: Cardiovascular;  Laterality: N/A;   TRANSESOPHAGEAL ECHOCARDIOGRAM (CATH LAB) N/A 09/22/2023   Procedure: TRANSESOPHAGEAL ECHOCARDIOGRAM;  Surgeon: Christopher Led, DO;  Location: [facility] INVASIVE CV LAB;  Service: Cardiovascular;  Laterality: N/A;   VIDEO BRONCHOSCOPY WITH ENDOBRONCHIAL ULTRASOUND N/A 08/16/2023   Procedure: BRONCHOSCOPY, WITH EBUS;  Surgeon:  Christopher Christopher Green RAMAN, MD;  Location: [facility] ENDOSCOPY;  Service: Pulmonary;  Laterality: N/A;    Allergies: Allergies as of 04/05/2024   (No Known Allergies)    Medications: Outpatient Encounter Medications as of 04/05/2024  Medication Sig   albuterol  (VENTOLIN  HFA) 108 (90 Base) MCG/ACT inhaler Inhale 2 puffs into the lungs every 6 (six) hours as needed for wheezing or shortness of breath.   amiodarone  (PACERONE ) 200 MG tablet Take 1 tablet (200 mg total) by mouth daily.   amitriptyline (ELAVIL) 10 MG tablet Take 1 tablet (10 mg total) by mouth at bedtime.   atorvastatin  (LIPITOR) 40 MG tablet TAKE 1 TABLET (40 MG TOTAL) BY MOUTH DAILY.   carvedilol  (COREG ) 3.125 MG tablet TAKE 1 TABLET BY MOUTH 2 TIMES DAILY WITH A MEAL.   cetirizine  (ZYRTEC ) 10 MG tablet TAKE 1 TABLET (10 MG TOTAL) BY MOUTH DAILY.   DULoxetine  (CYMBALTA ) 30 MG capsule Take 1 capsule (30 mg total) by mouth as needed (nerve pain).   empagliflozin  (JARDIANCE ) 10 MG TABS tablet Take 1 tablet (10 mg total) by mouth daily before breakfast.   icosapent  Ethyl (VASCEPA ) 1 g capsule TAKE 2 CAPSULES (2 G TOTAL) BY MOUTH 2 (TWO) TIMES DAILY.   metFORMIN  (GLUCOPHAGE -XR) 750 MG 24 hr tablet TAKE 2 TABLETS (1,500 MG TOTAL) BY MOUTH DAILY.   methylPREDNISolone  (MEDROL  DOSEPAK) 4 MG TBPK tablet Take by mouth daily, taper daily dose per package instructions.   oxyCODONE -acetaminophen  (PERCOCET) 10-325 MG tablet TAKE 1 TABLET BY MOUTH EVERY 4 TO 6 HOURS AS NEEDED FOR PAIN   potassium chloride  SA (KLOR-CON  M) 20 MEQ tablet TAKE 1 TABLET BY MOUTH 2 TIMES DAILY.   pregabalin  (LYRICA ) 200 MG capsule Take 1 capsule (200 mg total) by mouth daily.   rivaroxaban  (XARELTO ) 20 MG TABS tablet Take 1 tablet (20 mg total) by mouth daily with supper.   sacubitril-valsartan  (ENTRESTO ) 24-26 MG TAKE 1 TABLET BY MOUTH TWICE DAILY.   sildenafil  (VIAGRA ) 100 MG tablet Take 0.5-1 tablets (50-100 mg total) by mouth daily as needed for erectile dysfunction.    spironolactone  (ALDACTONE ) 25 MG tablet Take 0.5 tablets (12.5 mg total) by mouth at bedtime.   testosterone  cypionate (DEPOTESTOSTERONE CYPIONATE) 200 MG/ML injection INJECT 0.5 ML INTO THE MUSCLE EVERY 14 DAYS.   tiZANidine  (ZANAFLEX ) 4 MG tablet Take 1 tablet (4 mg total) by mouth 3 (three) times daily.   torsemide  (DEMADEX ) 20 MG tablet Take 1 tablet (20 mg total) by mouth every other day.   No facility-administered encounter medications on file as of 04/05/2024.    Social History: Social History   Tobacco Use   Smoking status: Every Day    Types: Cigarettes    Passive exposure: Current   Smokeless tobacco: Never   Tobacco comments:    1ppd 08/26/23  Vaping Use   Vaping status: Never Used  Substance Use Topics   Alcohol use: Yes    Comment: daily   Drug use: Never    1 pack  per day  Family Medical History: Family History  Problem Relation Age of Onset   Heart attack Father     Physical Examination: Vitals:   04/05/24 1258  BP: 108/60      Awake, alert, oriented to person, place, and time.  Speech is clear and fluent. Fund of knowledge is appropriate.   Cranial Nerves: Pupils equal round and reactive to light.  Facial tone is symmetric.    No posterior cervical or bilateral trapezial tenderness.   Mild lower posterior lumbar tenderness.     Strength:  Side Iliopsoas Quads Hamstring PF DF EHL  R 5 5 5 5 5 5   L 5 5 5 5 5 5    Reflexes are 1+ and symmetric at the  patella and achilles.     Clonus is not present.   Decreased sensation in bilateral lower extremities. Gait is abnormal- he limps.   Medical Decision Making  Imaging:  EXAM: MRI LUMBAR SPINE WITHOUT CONTRAST   TECHNIQUE: Multiplanar, multisequence MR imaging of the lumbar spine was performed. No intravenous contrast was administered.   COMPARISON:  April 12, 2019   FINDINGS: Bone marrow: No significant abnormality   Conus and cauda equina: No significant abnormality   Paraspinal  tissues: No significant abnormality   L1-L2: The disc is normal. There is mild facet arthropathy. No spinal stenosis or foraminal stenosis   L2-L3: There is mild degenerative disc disease with a mild disc bulge. There is facet arthropathy with facet enlargement and moderate spinal stenosis due to a small spinal canal.   L3-L4: There is mild degenerative disc disease with a mild disc bulge. There is mild facet arthropathy. There is no spinal stenosis or foraminal stenosis.   L4-L5: There is mild degenerative disc disease with a mild disc bulge. There is severe bilateral facet arthropathy with a slight degenerative spondylolisthesis. There is moderate spinal stenosis and mild bilateral neural foraminal stenosis   L5-S1: There is a mild disc bulge. There is mild facet arthropathy. No spinal stenosis or foraminal stenosis   IMPRESSION: No compression fracture or acute abnormality.   Congenitally small spinal canal.   There is severe facet arthropathy at L4-5 with a slight degenerative spondylolisthesis and moderate spinal stenosis.   Moderate spinal stenosis at L2-3 due to disc bulge, facet arthropathy and small spinal canal.  EXAM: LUMBAR SPINE - COMPLETE 4+ VIEW   COMPARISON:  08/21/2019   FINDINGS: Negative for fracture or dislocation. Mild narrowing of interspaces L2-L5, with small anterior endplate spurs. Interval increase in grade 1 anterolisthesis L4-5 since previous exam. No definite dynamic instability on flexion/extension. Aortic Atherosclerosis (ICD10-170.0).   IMPRESSION: 1. No acute findings. 2. Mild multilevel degenerative disc disease, with some increase in grade 1 anterolisthesis L4-5.   I have personally reviewed the images and agree with the above interpretation.  Assessment and Plan: Mr. Warman is a pleasant 53 y.o. male with known lumbar stenosis and neurogenic claudication who comes today for follow-up on his back pain.  He continues to have  numbness and tingling in his legs especially when standing or walking for prolonged period of time.  He is currently in a pain flare.  Plan includes the following:  -Steroid Dosepak for pain -Counseled on smoking cessation -Referral sent to the pain clinic for potential injections.  Calib Wadhwa PA-C Dept. of Neurosurgery

## 2024-04-07 ENCOUNTER — Ambulatory Visit: Payer: Self-pay

## 2024-04-07 NOTE — Telephone Encounter (Signed)
 FYI Only or Action Required?: FYI only for provider.  Patient was last seen in primary care on 04/05/2024 by Chandra Toribio POUR, MD.  Called Nurse Triage reporting Dysuria.  Symptoms began yesterday.  Interventions attempted: Nothing.  Symptoms are: gradually worsening.  Triage Disposition: See Physician Within 24 Hours  Patient/caregiver understands and will follow disposition?: Yes  Copied from CRM [id number]. Topic: Clinical - Red Word Triage >> Apr 07, 2024 10:48 AM Charlet HERO wrote: Red Word that prompted transfer to Nurse Triage: Pain and burns when he uses the bathroom frequent urination, Chandra Reason for Disposition  All other males with painful urination  Answer Assessment - Initial Assessment Questions Took a home UTI test, positive. Attempted to schedule for OV today, pt reports he doesn't want to wait and plans to go to UC instead.   1. SEVERITY: How bad is the pain?  (e.g., Scale 1-10; mild, moderate, or severe)     5/10  2. FREQUENCY: How many times have you had painful urination today?      Many times  3. PATTERN: Is pain present every time you urinate or just sometimes?      Every time  4. ONSET: When did the painful urination start?      Yesterday  5. FEVER: Do you have a fever? If Yes, ask: What is your temperature, how was it measured, and when did it start?     Feels feverish, has not checked temperature.  6. PAST UTI: Have you had a urine infection before? If Yes, ask: When was the last time? and What happened that time?      Yes, current symptoms match previous UTI's. Was prescribed abx's.  7. CAUSE: What do you think is causing the painful urination?      UTI  8. OTHER SYMPTOMS: Do you have any other symptoms? (e.g., flank pain, penis discharge, scrotal pain, blood in urine)     No.  Protocols used: Urination Pain - Male-A-AH

## 2024-04-10 NOTE — Progress Notes (Signed)
 ADVANCED HEART FAILURE CLINIC NOTE  Referring Physician: Chandra Toribio POUR, MD  Primary Care: Chandra Toribio POUR, MD Primary Cardiologist: Dr. Sheena HF: Dr. Gardenia  CC: Heart Failure HPI: Christopher Green is a 53 y.o. male with T2DM, HTN, DVT/PE, HTN, CHB s/p PPM, atrial fibrillation / flutter, lupus anticoagulant with elevated factor 8 on lifelong anticoagulation for antiphospholipid syndrome.  Christopher Green first established care with [facility] in October 2019 when he had an abnormal nuclear stress test followed by left heart catheterization in 03/2018 with reportedly minimal plaque.  During that time he was also seen for uncontrolled hypertension in the emergency department.  He was not seen by [facility] health cardiology until December 2023 when he presented to [facility] with complaints of shortness of breath and a systolic blood pressure over 200 and heart rate of 40.  CTA PE on admission was significant for acute segmental and subsegmental pulmonary emboli.  While admitted patient was also significantly bradycardic with Mobitz 2 heart block.  Echocardiogram during admission with LVEF of 65 to 70%.  Due to persistent symptoms of dyspnea and 2-1 AV block with right bundle branch block on EKG he underwent placement of permanent pacemaker by Dr. Danelle in 2024.  Despite placement of permanent pacemaker he had multiple episodes of syncope in August 2024.  Device interrogation by EP with atrial fibrillation with controlled ventricular rates and atrial flutter.  During this time he also had intermittent low blood pressures that were associated with his syncope but etiology could not be determined.  He subsequently underwent cardiac MRI on 10/24 which demonstrated severe LVH of the septum with multifocal LGE that was not present in the areas of hypertrophy.  Patient subsequently sent to heart failure clinic for further evaluation.  He reports that his father passed in his 28s from a 'heart attack' but did not undergo  LHC; he did have an autopsy.  We are trying to obtain records from his autopsy. His grandfather passed from a similar condition.   HRCT notable for hilar adenopathy. EBUS on 08/16/23 negative for granulomas or signs of mediastinal sarcoidosis.   Today he returns for AHF follow up. Overall feeling ***. Denies palpitations, CP, dizziness, edema, or PND/Orthopnea. *** SOB. Appetite ok. No fever or chills. Weight at home *** pounds. Taking all medications. Denies ETOH, tobacco or drug use.   Current Outpatient Medications  Medication Sig Dispense Refill   albuterol  (VENTOLIN  HFA) 108 (90 Base) MCG/ACT inhaler Inhale 2 puffs into the lungs every 6 (six) hours as needed for wheezing or shortness of breath. 8 g 0   amiodarone  (PACERONE ) 200 MG tablet Take 1 tablet (200 mg total) by mouth daily. 180 tablet 0   amitriptyline (ELAVIL) 10 MG tablet Take 1 tablet (10 mg total) by mouth at bedtime. 30 tablet 3   atorvastatin  (LIPITOR) 40 MG tablet TAKE 1 TABLET (40 MG TOTAL) BY MOUTH DAILY. 90 tablet 1   carvedilol  (COREG ) 3.125 MG tablet TAKE 1 TABLET BY MOUTH 2 TIMES DAILY WITH A MEAL. 60 tablet 4   cetirizine  (ZYRTEC ) 10 MG tablet TAKE 1 TABLET (10 MG TOTAL) BY MOUTH DAILY. 30 tablet 11   DULoxetine  (CYMBALTA ) 30 MG capsule Take 1 capsule (30 mg total) by mouth as needed (nerve pain).     empagliflozin  (JARDIANCE ) 10 MG TABS tablet Take 1 tablet (10 mg total) by mouth daily before breakfast. 30 tablet 6   icosapent  Ethyl (VASCEPA ) 1 g capsule TAKE 2 CAPSULES (2 G TOTAL) BY  MOUTH 2 (TWO) TIMES DAILY. 120 capsule 6   metFORMIN  (GLUCOPHAGE -XR) 750 MG 24 hr tablet TAKE 2 TABLETS (1,500 MG TOTAL) BY MOUTH DAILY. 180 tablet 1   methylPREDNISolone  (MEDROL  DOSEPAK) 4 MG TBPK tablet Take by mouth daily, taper daily dose per package instructions. 21 tablet 0   oxyCODONE -acetaminophen  (PERCOCET) 10-325 MG tablet TAKE 1 TABLET BY MOUTH EVERY 4 TO 6 HOURS AS NEEDED FOR PAIN 150 tablet 0   potassium chloride  SA (KLOR-CON   M) 20 MEQ tablet TAKE 1 TABLET BY MOUTH 2 TIMES DAILY. 33 tablet 6   pregabalin  (LYRICA ) 200 MG capsule Take 1 capsule (200 mg total) by mouth daily. 60 capsule 2   rivaroxaban  (XARELTO ) 20 MG TABS tablet Take 1 tablet (20 mg total) by mouth daily with supper. 30 tablet 11   sacubitril-valsartan  (ENTRESTO ) 24-26 MG TAKE 1 TABLET BY MOUTH TWICE DAILY. 120 tablet 0   sildenafil  (VIAGRA ) 100 MG tablet Take 0.5-1 tablets (50-100 mg total) by mouth daily as needed for erectile dysfunction. 10 tablet 11   spironolactone  (ALDACTONE ) 25 MG tablet Take 0.5 tablets (12.5 mg total) by mouth at bedtime. 45 tablet 1   testosterone  cypionate (DEPOTESTOSTERONE CYPIONATE) 200 MG/ML injection INJECT 0.5 ML INTO THE MUSCLE EVERY 14 DAYS. 10 mL 2   tiZANidine  (ZANAFLEX ) 4 MG tablet Take 1 tablet (4 mg total) by mouth 3 (three) times daily. 90 tablet 1   torsemide  (DEMADEX ) 20 MG tablet Take 1 tablet (20 mg total) by mouth every other day.     No current facility-administered medications for this visit.    No Known Allergies  PHYSICAL EXAM: There were no vitals filed for this visit. General:  *** appearing.  No respiratory difficulty Neck: JVD *** cm.  Cor: Regular rate & rhythm. No murmurs. Lungs: clear Extremities: no edema  Neuro: alert & oriented x 3. Affect pleasant.    DATA REVIEW ECG: 05/28/23: Atrial flutter with intermittent pacing   ECHO: 02/12/23: LVEF 60 to 65% with normal RV function.  Severe LVH as per Dr Gardenia.   CMR (04/12/23): 1. Mild decrease in LVEF, LVEF 46%, in the setting of free breathing artifact.  2. There is severe hypertrophy of the LV septum. Though this meets criteria for hypertrophic cardiomyopathy, this is multi-focal late gadolinium enhancement with increase in ECV signal; this is not present in the areas of hypertrophy. Consider PET imaging for inflammatory cardiomyopathy evaluation.  Cardiac PET (05/19/23):   FDG uptake findings are consistent with active  myocardial inflammation/sarcoidosis.   FDG uptake was observed. FDG uptake was focal. FDG uptake was present in the apical to basal lateral, apical anterior and apex segment(s). LV perfusion is abnormal. Defect 1: There is a small defect with mild reduction in uptake present in the apical inferior location(s).   Left ventricular function is abnormal. Global function is moderately reduced. EF: 36%.   Coronary calcium  was present on the attenuation correction CT images. Moderate coronary calcifications were present. Coronary calcifications were present in the left anterior descending artery, left circumflex artery and right coronary artery distribution(s)  ASSESSMENT & PLAN:  Heart failure with mildly reduced EF, Evaluation for infiltrative cardiomyopathy Etiology of HF: Nonischemic cardiomyopathy with significant LVH.  Although he does not have the typical characteristics of cardiac sarcoid he does have patchy LGE on cardiac MRI.  Interestingly he has severe LVH that would be more consistent with hypertrophic cardiomyopathy. Cardiac PET consistent with sarcoid. He has now had a HRCT which has some hilar adenopathy. EBUS  negative for sarcoid.  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 NYHA class / AHA Stage: NYHA III *** Volume status & Diuretics: Euvolemic on exam; torsemide  every other day. *** Vasodilators: Continue entresto  24/26mg  BID. Repeat BMP today.  Beta-Blocker: Coreg  3.125 mg twice daily MRA: Spironolactone  12.5 mg daily.  Cardiometabolic: Jardiance  10 mg daily Devices therapies & Valvulopathies: Dual-chamber permanent pacemaker placed by Dr. Waddell for advanced AV block. Will need to upgrade device to ICD.  Advanced therapies: Not indicated  2. Cardiac sarcoid - Reviewed pulmonology notes from 08/26/23; EBUS on 08/16/23 negative for sarcoid.  - Quantiferon-gold negative - Will review vaccination record - PET consistent  with sarcoid. EBUS negative - Genetic panel concerning for HCM. Will discuss with imaging colleagues. + MYH6.  - Has been referred to Dr Fairy for genetic counseling. Deferred scheduling at that time.   3. PAF -Noted to be in persistent atrial fibrillation since March 2024 09/2023 S/P TEE DC-CV with conversion to SR.  -Continue Xarelto  20 mg daily -Followed by Dr. Waddell -Continue Coreg  3.125 mg twice daily - Plan to follow amiodarone  screening per guidelines  - Needs yearly eye exams and PFTs.  We discussed today.  - Received CPAP recently. Sleeps very poorly.  - Will plan on D/C amio if he remains in NSR. Repeat CMP/TSH for amio monitoring.  - Continue amiodarone  200 mg daily *** - Repeat EKG NSR.  4. Coronary artery disease -Nonobstructive mild CAD per left heart cath in 2019. -No chest pain  5. Hyperlipidemia -Continue Lipitor 40 mg daily  6. History of PE/DVT and antiphospholipid antibody syndrome -Will require lifelong anticoagulation with Xarelto  20 mg daily.  7. Severe back pain - Followed by NSGY - Based on chart review from 07/26/23, saw Lyle Decamp; underwent C7-T1 injection for pain control.  - Remains limited by back pain.   8. Obesity  There is no height or weight on file to calculate BMI. He has tried Ozempic  but had nausea so he stopped. He does not want to re challenge.   9. Tobacco Abuse - Working on reducing smoking; discussed extensively.   Follow up ***  Beckey LITTIE Coe AGACNP-BC  4:24 PM

## 2024-04-11 ENCOUNTER — Telehealth (HOSPITAL_COMMUNITY): Payer: Self-pay

## 2024-04-11 NOTE — Telephone Encounter (Signed)
 Called to confirm/remind patient of their appointment at the [facility] on 04/11/2024.   Appointment:   [x] Confirmed  [] Left mess   [] No answer/No voice mail  [] VM Full/unable to leave message  [] Phone not in service  Patient reminded to bring all medications and/or complete list.  Confirmed patient has transportation. Gave directions, instructed to utilize valet parking.

## 2024-04-12 ENCOUNTER — Encounter (HOSPITAL_COMMUNITY): Payer: Self-pay | Admitting: Cardiology

## 2024-04-12 ENCOUNTER — Ambulatory Visit (HOSPITAL_COMMUNITY): Payer: Self-pay | Admitting: Internal Medicine

## 2024-04-12 ENCOUNTER — Ambulatory Visit (HOSPITAL_COMMUNITY)
Admission: RE | Admit: 2024-04-12 | Discharge: 2024-04-12 | Disposition: A | Discharge status: Home / Self Care | Source: Ambulatory Visit | Attending: Internal Medicine | Admitting: Internal Medicine

## 2024-04-12 VITALS — BP 160/84 | HR 58 | Ht 73.0 in | Wt 274.0 lb

## 2024-04-12 DIAGNOSIS — E669 Obesity, unspecified: Secondary | ICD-10-CM | POA: Diagnosis not present

## 2024-04-12 DIAGNOSIS — Z8674 Personal history of sudden cardiac arrest: Secondary | ICD-10-CM | POA: Insufficient documentation

## 2024-04-12 DIAGNOSIS — D8685 Sarcoid myocarditis: Secondary | ICD-10-CM | POA: Diagnosis not present

## 2024-04-12 DIAGNOSIS — Z86711 Personal history of pulmonary embolism: Secondary | ICD-10-CM | POA: Diagnosis not present

## 2024-04-12 DIAGNOSIS — F17210 Nicotine dependence, cigarettes, uncomplicated: Secondary | ICD-10-CM | POA: Diagnosis not present

## 2024-04-12 DIAGNOSIS — R590 Localized enlarged lymph nodes: Secondary | ICD-10-CM | POA: Insufficient documentation

## 2024-04-12 DIAGNOSIS — Z6836 Body mass index (BMI) 36.0-36.9, adult: Secondary | ICD-10-CM | POA: Diagnosis not present

## 2024-04-12 DIAGNOSIS — I480 Paroxysmal atrial fibrillation: Secondary | ICD-10-CM | POA: Diagnosis not present

## 2024-04-12 DIAGNOSIS — I4819 Other persistent atrial fibrillation: Secondary | ICD-10-CM | POA: Diagnosis not present

## 2024-04-12 DIAGNOSIS — I428 Other cardiomyopathies: Secondary | ICD-10-CM | POA: Diagnosis not present

## 2024-04-12 DIAGNOSIS — I110 Hypertensive heart disease with heart failure: Secondary | ICD-10-CM | POA: Diagnosis not present

## 2024-04-12 DIAGNOSIS — Z716 Tobacco abuse counseling: Secondary | ICD-10-CM | POA: Diagnosis not present

## 2024-04-12 DIAGNOSIS — I4892 Unspecified atrial flutter: Secondary | ICD-10-CM | POA: Diagnosis not present

## 2024-04-12 DIAGNOSIS — Z86718 Personal history of other venous thrombosis and embolism: Secondary | ICD-10-CM | POA: Diagnosis not present

## 2024-04-12 DIAGNOSIS — D869 Sarcoidosis, unspecified: Secondary | ICD-10-CM | POA: Insufficient documentation

## 2024-04-12 DIAGNOSIS — I5022 Chronic systolic (congestive) heart failure: Secondary | ICD-10-CM | POA: Diagnosis not present

## 2024-04-12 DIAGNOSIS — E66812 Obesity, class 2: Secondary | ICD-10-CM

## 2024-04-12 DIAGNOSIS — E785 Hyperlipidemia, unspecified: Secondary | ICD-10-CM | POA: Diagnosis not present

## 2024-04-12 DIAGNOSIS — Z720 Tobacco use: Secondary | ICD-10-CM

## 2024-04-12 DIAGNOSIS — Z7984 Long term (current) use of oral hypoglycemic drugs: Secondary | ICD-10-CM | POA: Diagnosis not present

## 2024-04-12 DIAGNOSIS — I2782 Chronic pulmonary embolism: Secondary | ICD-10-CM

## 2024-04-12 DIAGNOSIS — D6862 Lupus anticoagulant syndrome: Secondary | ICD-10-CM | POA: Insufficient documentation

## 2024-04-12 DIAGNOSIS — Z7901 Long term (current) use of anticoagulants: Secondary | ICD-10-CM | POA: Diagnosis not present

## 2024-04-12 DIAGNOSIS — I2510 Atherosclerotic heart disease of native coronary artery without angina pectoris: Secondary | ICD-10-CM | POA: Insufficient documentation

## 2024-04-12 DIAGNOSIS — E119 Type 2 diabetes mellitus without complications: Secondary | ICD-10-CM | POA: Insufficient documentation

## 2024-04-12 DIAGNOSIS — Z8249 Family history of ischemic heart disease and other diseases of the circulatory system: Secondary | ICD-10-CM | POA: Insufficient documentation

## 2024-04-12 DIAGNOSIS — Z79899 Other long term (current) drug therapy: Secondary | ICD-10-CM | POA: Insufficient documentation

## 2024-04-12 DIAGNOSIS — M549 Dorsalgia, unspecified: Secondary | ICD-10-CM | POA: Diagnosis not present

## 2024-04-12 DIAGNOSIS — D6861 Antiphospholipid syndrome: Secondary | ICD-10-CM | POA: Diagnosis not present

## 2024-04-12 LAB — BASIC METABOLIC PANEL WITH GFR
Anion gap: 15 (ref 5–15)
BUN: 12 mg/dL (ref 6–20)
CO2: 26 mmol/L (ref 22–32)
Calcium: 9.2 mg/dL (ref 8.9–10.3)
Chloride: 97 mmol/L — ABNORMAL LOW (ref 98–111)
Creatinine, Ser: 0.89 mg/dL (ref 0.61–1.24)
GFR, Estimated: >60 mL/min (ref >60)
Glucose, Bld: 120 mg/dL — ABNORMAL HIGH (ref 70–99)
Potassium: 4.4 mmol/L (ref 3.5–5.1)
Sodium: 138 mmol/L (ref 135–145)

## 2024-04-12 LAB — BRAIN NATRIURETIC PEPTIDE: B Natriuretic Peptide: 187.9 pg/mL — ABNORMAL HIGH (ref 0.0–100.0)

## 2024-04-12 LAB — CBC
HCT: 46.6 % (ref 39.0–52.0)
Hemoglobin: 15.3 g/dL (ref 13.0–17.0)
MCH: 29.8 pg (ref 26.0–34.0)
MCHC: 32.8 g/dL (ref 30.0–36.0)
MCV: 90.8 fL (ref 80.0–100.0)
Platelets: 280 K/uL (ref 150–400)
RBC: 5.13 MIL/uL (ref 4.22–5.81)
RDW: 13.2 % (ref 11.5–15.5)
WBC: 10.1 K/uL (ref 4.0–10.5)
nRBC: 0 % (ref 0.0–0.2)

## 2024-04-12 MED ORDER — METOPROLOL SUCCINATE ER 25 MG PO TB24: 12.5000 mg | ORAL_TABLET | Freq: Every day | ORAL | 3 refills | Status: AC

## 2024-04-12 NOTE — Patient Instructions (Signed)
 Medication Changes:  STOP CARVEDILOL    START METOPROLOL SUCCINATE 12.5mg  ONCE DAILY AT BEDTIME   Lab Work:  Labs done today, your results will be available in MyChart, we will contact you for abnormal readings.  Follow-Up in: 3 WEEKS AS SCHEDULED WITH DR. ZENAIDA   At the [facility], you and your health needs are our priority. We have a designated team specialized in the treatment of Heart Failure. This Care Team includes your primary Heart Failure Specialized Cardiologist (physician), Advanced Practice Providers (APPs- Physician Assistants and Nurse Practitioners), and Pharmacist who all work together to provide you with the care you need, when you need it.   You may see any of the following providers on your designated Care Team at your next follow up:  Dr. Toribio Fuel Dr. Ezra Shuck Dr. Odis Zenaida Greig Mosses, NP Caffie Shed, [place] [facility] [place], [place] Beckey Coe, NP Jordan Lee, NP Tinnie Redman, PharmD   Please be sure to bring in all your medications bottles to every appointment.   Need to Contact Us :  If you have any questions or concerns before your next appointment please send us  a message through [facility] or call our office at [phone].    TO LEAVE A MESSAGE FOR THE NURSE SELECT OPTION 2, PLEASE LEAVE A MESSAGE INCLUDING: YOUR NAME DATE OF BIRTH CALL BACK NUMBER REASON FOR CALL**this is important as we prioritize the call backs  YOU WILL RECEIVE A CALL BACK THE SAME DAY AS LONG AS YOU CALL BEFORE 4:00 PM

## 2024-04-17 ENCOUNTER — Encounter (HOSPITAL_COMMUNITY): Payer: Self-pay

## 2024-04-17 ENCOUNTER — Telehealth (HOSPITAL_COMMUNITY): Payer: Self-pay

## 2024-04-17 NOTE — Telephone Encounter (Signed)
 Spoke with patient to complete pre-procedure call.     Health status review:  Any new medical conditions, recent signs of acute illness or been started on antibiotics? No Any recent hospitalizations or surgeries? No Any new medications started since pre-op  visit? No  Follow all medication instructions prior to procedure or the procedure may be rescheduled:    HOLD: Empagliflozin  (Jardiance ) for 3 days prior to the procedure. Last dose on Monday, November 17.  HOLD: Xarelto  (Rivaroxaban ) for 2 day(s) prior to your procedure. Your last dose will be Tuesday, November 18, PM dose. On the morning of your procedure ONLY take Entresto  and Carvedilol  the morning of your procedure. The night before your procedure and the morning of your procedure, wash thoroughly with the CHG surgical soap from the neck down, paying special attention to the area where your procedure will be performed.  Nothing to eat or drink after midnight prior to your procedure.  Pre-procedure testing scheduled: lab work completed.  Confirmed patient is scheduled for Lead Extraction, PPM Generator Removal, Implantable cardioverter defibrilator (ICD) on Friday, November 21 with Dr. Dr. Waddell. Instructed patient to arrive at the Main Entrance A at [facility]: [address] [place], [place] [address] and check in at Admitting at 12:30 PM.  You will be staying overnight at the hospital. Please bring necessary items with you.  You MUST have a responsible adult to drive you home and MUST be with you the first 24 hours after you arrive home or your procedure could be cancelled.  Informed a nurse may call a day before the procedure to confirm arrival time and ensure instructions are followed.  Patient verbalized understanding to information provided and is agreeable to proceed with procedure.   Advised to contact RN Navigator at [phone], to inform of any new medications started after call or concerns prior to procedure.

## 2024-04-26 DIAGNOSIS — Z419 Encounter for procedure for purposes other than remedying health state, unspecified: Secondary | ICD-10-CM | POA: Diagnosis not present

## 2024-05-02 ENCOUNTER — Telehealth (HOSPITAL_COMMUNITY): Payer: Self-pay | Admitting: Cardiology

## 2024-05-02 ENCOUNTER — Other Ambulatory Visit: Payer: Self-pay | Admitting: Family Medicine

## 2024-05-02 NOTE — Telephone Encounter (Signed)
 Called to confirm/remind patient of their appointment at the [facility] on 05/02/24.   Appointment:   [x] Confirmed  [] Left mess   [] No answer/No voice mail  [] VM Full/unable to leave message  [] Phone not in service  Patient reminded to bring all medications and/or complete list.  Confirmed patient has transportation. Gave directions, instructed to utilize valet parking.

## 2024-05-03 ENCOUNTER — Ambulatory Visit (HOSPITAL_COMMUNITY)
Admission: RE | Admit: 2024-05-03 | Discharge: 2024-05-03 | Disposition: A | Discharge status: Home / Self Care | Source: Ambulatory Visit | Attending: Cardiology | Admitting: Cardiology

## 2024-05-03 VITALS — BP 102/56 | HR 60 | Wt 266.0 lb

## 2024-05-03 DIAGNOSIS — Z7901 Long term (current) use of anticoagulants: Secondary | ICD-10-CM | POA: Diagnosis not present

## 2024-05-03 DIAGNOSIS — I2510 Atherosclerotic heart disease of native coronary artery without angina pectoris: Secondary | ICD-10-CM | POA: Insufficient documentation

## 2024-05-03 DIAGNOSIS — I480 Paroxysmal atrial fibrillation: Secondary | ICD-10-CM

## 2024-05-03 DIAGNOSIS — I428 Other cardiomyopathies: Secondary | ICD-10-CM

## 2024-05-03 DIAGNOSIS — E669 Obesity, unspecified: Secondary | ICD-10-CM | POA: Insufficient documentation

## 2024-05-03 DIAGNOSIS — Z79899 Other long term (current) drug therapy: Secondary | ICD-10-CM | POA: Diagnosis not present

## 2024-05-03 DIAGNOSIS — Z6836 Body mass index (BMI) 36.0-36.9, adult: Secondary | ICD-10-CM | POA: Insufficient documentation

## 2024-05-03 DIAGNOSIS — I5022 Chronic systolic (congestive) heart failure: Secondary | ICD-10-CM | POA: Diagnosis not present

## 2024-05-03 DIAGNOSIS — Z950 Presence of cardiac pacemaker: Secondary | ICD-10-CM | POA: Diagnosis not present

## 2024-05-03 DIAGNOSIS — I110 Hypertensive heart disease with heart failure: Secondary | ICD-10-CM | POA: Diagnosis not present

## 2024-05-03 DIAGNOSIS — I422 Other hypertrophic cardiomyopathy: Secondary | ICD-10-CM | POA: Diagnosis not present

## 2024-05-03 DIAGNOSIS — Z86718 Personal history of other venous thrombosis and embolism: Secondary | ICD-10-CM | POA: Diagnosis not present

## 2024-05-03 DIAGNOSIS — Z86711 Personal history of pulmonary embolism: Secondary | ICD-10-CM | POA: Insufficient documentation

## 2024-05-03 NOTE — Patient Instructions (Signed)
 There has been no changes to your medications.  Your physician recommends that you schedule a follow-up appointment in: 4 months.  If you have any questions or concerns before your next appointment please send us  a message through [place] or call our office at [phone].    TO LEAVE A MESSAGE FOR THE NURSE SELECT OPTION 2, PLEASE LEAVE A MESSAGE INCLUDING: YOUR NAME DATE OF BIRTH CALL BACK NUMBER REASON FOR CALL**this is important as we prioritize the call backs  YOU WILL RECEIVE A CALL BACK THE SAME DAY AS LONG AS YOU CALL BEFORE 4:00 PM  At the [facility], you and your health needs are our priority. As part of our continuing mission to provide you with exceptional heart care, we have created designated Provider Care Teams. These Care Teams include your primary Cardiologist (physician) and Advanced Practice Providers (APPs- Physician Assistants and Nurse Practitioners) who all work together to provide you with the care you need, when you need it.   You may see any of the following providers on your designated Care Team at your next follow up: Dr Toribio Fuel Dr Ezra Shuck Dr. [name] Greig Mosses, NP Caffie Shed, [place] [facility] [place], [place] Beckey Coe, NP Jordan Lee, NP Ellouise Class, NP Tinnie Redman, PharmD Jaun Bash, PharmD   Please be sure to bring in all your medications bottles to every appointment.    Thank you for choosing [facility]

## 2024-05-05 ENCOUNTER — Ambulatory Visit (HOSPITAL_COMMUNITY)

## 2024-05-05 ENCOUNTER — Ambulatory Visit (HOSPITAL_COMMUNITY)
Admission: RE | Admit: 2024-05-05 | Discharge: 2024-05-06 | Disposition: A | Discharge status: Home / Self Care | Attending: Internal Medicine | Admitting: Internal Medicine

## 2024-05-05 ENCOUNTER — Other Ambulatory Visit (HOSPITAL_COMMUNITY): Payer: Self-pay | Admitting: Family Medicine

## 2024-05-05 ENCOUNTER — Encounter (HOSPITAL_COMMUNITY): Payer: Self-pay | Admitting: Internal Medicine

## 2024-05-05 ENCOUNTER — Ambulatory Visit (HOSPITAL_COMMUNITY): Payer: Self-pay | Admitting: Anesthesiology

## 2024-05-05 ENCOUNTER — Ambulatory Visit (HOSPITAL_COMMUNITY)
Admission: RE | Disposition: A | Discharge status: Home / Self Care | Payer: Self-pay | Source: Home / Self Care | Attending: Internal Medicine

## 2024-05-05 DIAGNOSIS — E785 Hyperlipidemia, unspecified: Secondary | ICD-10-CM | POA: Diagnosis present

## 2024-05-05 DIAGNOSIS — I517 Cardiomegaly: Secondary | ICD-10-CM | POA: Diagnosis present

## 2024-05-05 DIAGNOSIS — E669 Obesity, unspecified: Secondary | ICD-10-CM | POA: Diagnosis present

## 2024-05-05 DIAGNOSIS — I4819 Other persistent atrial fibrillation: Secondary | ICD-10-CM | POA: Diagnosis present

## 2024-05-05 DIAGNOSIS — I428 Other cardiomyopathies: Secondary | ICD-10-CM | POA: Diagnosis not present

## 2024-05-05 DIAGNOSIS — I10 Essential (primary) hypertension: Secondary | ICD-10-CM | POA: Diagnosis not present

## 2024-05-05 DIAGNOSIS — T82120A Displacement of cardiac electrode, initial encounter: Secondary | ICD-10-CM | POA: Diagnosis present

## 2024-05-05 DIAGNOSIS — Z950 Presence of cardiac pacemaker: Secondary | ICD-10-CM

## 2024-05-05 DIAGNOSIS — I4811 Longstanding persistent atrial fibrillation: Secondary | ICD-10-CM | POA: Diagnosis not present

## 2024-05-05 DIAGNOSIS — Z7901 Long term (current) use of anticoagulants: Secondary | ICD-10-CM | POA: Diagnosis not present

## 2024-05-05 DIAGNOSIS — Y831 Surgical operation with implant of artificial internal device as the cause of abnormal reaction of the patient, or of later complication, without mention of misadventure at the time of the procedure: Secondary | ICD-10-CM | POA: Diagnosis not present

## 2024-05-05 DIAGNOSIS — R590 Localized enlarged lymph nodes: Secondary | ICD-10-CM | POA: Diagnosis not present

## 2024-05-05 DIAGNOSIS — I441 Atrioventricular block, second degree: Secondary | ICD-10-CM

## 2024-05-05 DIAGNOSIS — G4733 Obstructive sleep apnea (adult) (pediatric): Secondary | ICD-10-CM | POA: Diagnosis not present

## 2024-05-05 DIAGNOSIS — I2510 Atherosclerotic heart disease of native coronary artery without angina pectoris: Secondary | ICD-10-CM | POA: Diagnosis not present

## 2024-05-05 DIAGNOSIS — F17200 Nicotine dependence, unspecified, uncomplicated: Secondary | ICD-10-CM | POA: Diagnosis not present

## 2024-05-05 DIAGNOSIS — Z7984 Long term (current) use of oral hypoglycemic drugs: Secondary | ICD-10-CM | POA: Diagnosis not present

## 2024-05-05 DIAGNOSIS — E119 Type 2 diabetes mellitus without complications: Secondary | ICD-10-CM

## 2024-05-05 DIAGNOSIS — I422 Other hypertrophic cardiomyopathy: Secondary | ICD-10-CM | POA: Diagnosis not present

## 2024-05-05 DIAGNOSIS — I442 Atrioventricular block, complete: Secondary | ICD-10-CM | POA: Diagnosis not present

## 2024-05-05 DIAGNOSIS — R55 Syncope and collapse: Secondary | ICD-10-CM | POA: Diagnosis present

## 2024-05-05 DIAGNOSIS — D6869 Other thrombophilia: Secondary | ICD-10-CM | POA: Diagnosis not present

## 2024-05-05 DIAGNOSIS — Z86711 Personal history of pulmonary embolism: Secondary | ICD-10-CM | POA: Diagnosis not present

## 2024-05-05 DIAGNOSIS — Z01818 Encounter for other preprocedural examination: Secondary | ICD-10-CM

## 2024-05-05 DIAGNOSIS — D8689 Sarcoidosis of other sites: Secondary | ICD-10-CM | POA: Diagnosis not present

## 2024-05-05 DIAGNOSIS — D869 Sarcoidosis, unspecified: Secondary | ICD-10-CM | POA: Diagnosis not present

## 2024-05-05 HISTORY — PX: PPM GENERATOR REMOVAL: EP1234

## 2024-05-05 HISTORY — PX: LEAD EXTRACTION: EP1211

## 2024-05-05 HISTORY — PX: ICD IMPLANT: EP1208

## 2024-05-05 LAB — SURGICAL PCR SCREEN
MRSA, PCR: NEGATIVE
Staphylococcus aureus: NEGATIVE

## 2024-05-05 LAB — ABO/RH: ABO/RH(D): A POS

## 2024-05-05 LAB — GLUCOSE, CAPILLARY
Glucose-Capillary: 99 mg/dL (ref 70–99)
Glucose-Capillary: 118 mg/dL — ABNORMAL HIGH (ref 70–99)
Glucose-Capillary: 116 mg/dL — ABNORMAL HIGH (ref 70–99)

## 2024-05-05 LAB — PREPARE RBC (CROSSMATCH)

## 2024-05-05 MED ORDER — ACETAMINOPHEN 325 MG PO TABS: 325.0000 mg | ORAL_TABLET | ORAL | Status: DC | PRN

## 2024-05-05 MED ORDER — LIDOCAINE HCL 1 % IJ SOLN
INTRAMUSCULAR | Status: AC
  Filled 2024-05-05: qty 60

## 2024-05-05 MED ORDER — SODIUM CHLORIDE 0.9 % IV SOLN: INTRAVENOUS | Status: DC

## 2024-05-05 MED ORDER — CHLORHEXIDINE GLUCONATE 0.12 % MT SOLN
OROMUCOSAL | Status: AC
  Filled 2024-05-05: qty 15

## 2024-05-05 MED ORDER — OXYCODONE HCL 5 MG PO TABS
5.0000 mg | ORAL_TABLET | Freq: Once | ORAL | Status: AC | PRN
  Administered 2024-05-05: 5 mg via ORAL

## 2024-05-05 MED ORDER — POVIDONE-IODINE 10 % EX SWAB
2.0000 | Freq: Once | CUTANEOUS | Status: AC
  Administered 2024-05-05: 2 via TOPICAL

## 2024-05-05 MED ORDER — PROPOFOL 10 MG/ML IV BOLUS
INTRAVENOUS | Status: DC | PRN
  Administered 2024-05-05: 150 mg via INTRAVENOUS

## 2024-05-05 MED ORDER — FENTANYL CITRATE (PF) 100 MCG/2ML IJ SOLN: 25.0000 ug | INTRAMUSCULAR | Status: DC | PRN

## 2024-05-05 MED ORDER — FENTANYL CITRATE (PF) 100 MCG/2ML IJ SOLN
INTRAMUSCULAR | Status: AC
  Filled 2024-05-05: qty 2

## 2024-05-05 MED ORDER — MIDAZOLAM HCL (PF) 2 MG/2ML IJ SOLN
INTRAMUSCULAR | Status: DC | PRN
  Administered 2024-05-05: 2 mg via INTRAVENOUS

## 2024-05-05 MED ORDER — INSULIN ASPART 100 UNIT/ML IJ SOLN: 0.0000 [IU] | INTRAMUSCULAR | Status: DC | PRN

## 2024-05-05 MED ORDER — VASOPRESSIN 20 UNIT/ML IV SOLN
INTRAVENOUS | Status: DC | PRN
Start: 2024-05-05 — End: 2024-05-05
  Administered 2024-05-05: 1 [IU] via INTRAVENOUS

## 2024-05-05 MED ORDER — DEXAMETHASONE SOD PHOSPHATE PF 10 MG/ML IJ SOLN
INTRAMUSCULAR | Status: DC | PRN
  Administered 2024-05-05: 10 mg via INTRAVENOUS

## 2024-05-05 MED ORDER — OXYCODONE HCL 5 MG/5ML PO SOLN
5.0000 mg | Freq: Once | ORAL | Status: AC | PRN
  Filled 2024-05-05: qty 5

## 2024-05-05 MED ORDER — PHENYLEPHRINE HCL-NACL 20-0.9 MG/250ML-% IV SOLN
INTRAVENOUS | Status: DC | PRN
  Administered 2024-05-05: 50 ug/min via INTRAVENOUS

## 2024-05-05 MED ORDER — AMISULPRIDE (ANTIEMETIC) 5 MG/2ML IV SOLN: 10.0000 mg | Freq: Once | INTRAVENOUS | Status: DC | PRN

## 2024-05-05 MED ORDER — SODIUM CHLORIDE 0.9 % IV SOLN: 12.5000 mg | INTRAVENOUS | Status: DC | PRN

## 2024-05-05 MED ORDER — ACETAMINOPHEN 500 MG PO TABS
1000.0000 mg | ORAL_TABLET | Freq: Once | ORAL | Status: AC
  Administered 2024-05-05: 1000 mg via ORAL
  Filled 2024-05-05: qty 2

## 2024-05-05 MED ORDER — SODIUM CHLORIDE 0.9 % IV SOLN: INTRAVENOUS | Status: DC | PRN

## 2024-05-05 MED ORDER — ONDANSETRON HCL 4 MG/2ML IJ SOLN: 4.0000 mg | Freq: Four times a day (QID) | INTRAMUSCULAR | Status: DC | PRN

## 2024-05-05 MED ORDER — CEFAZOLIN SODIUM-DEXTROSE 1-4 GM/50ML-% IV SOLN
1.0000 g | Freq: Four times a day (QID) | INTRAVENOUS | Status: DC
  Administered 2024-05-05 – 2024-05-06 (×2): 1 g via INTRAVENOUS
  Filled 2024-05-05 (×3): qty 50

## 2024-05-05 MED ORDER — CEFAZOLIN SODIUM-DEXTROSE 3-4 GM/150ML-% IV SOLN
3.0000 g | INTRAVENOUS | Status: AC
  Administered 2024-05-05: 3 g via INTRAVENOUS
  Filled 2024-05-05 (×2): qty 150

## 2024-05-05 MED ORDER — HEPARIN (PORCINE) IN NACL 1000-0.9 UT/500ML-% IV SOLN
INTRAVENOUS | Status: DC | PRN
Start: 2024-05-05 — End: 2024-05-05
  Administered 2024-05-05: 500 mL

## 2024-05-05 MED ORDER — OXYCODONE HCL 5 MG PO TABS
ORAL_TABLET | ORAL | Status: AC
  Filled 2024-05-05: qty 1

## 2024-05-05 MED ORDER — SODIUM CHLORIDE 0.9 % IV SOLN
80.0000 mg | INTRAVENOUS | Status: AC
  Administered 2024-05-05: 80 mg

## 2024-05-05 MED ORDER — CHLORHEXIDINE GLUCONATE 4 % EX SOLN
4.0000 | Freq: Once | CUTANEOUS | Status: DC
  Filled 2024-05-05: qty 60

## 2024-05-05 MED ORDER — ONDANSETRON HCL 4 MG/2ML IJ SOLN
INTRAMUSCULAR | Status: DC | PRN
  Administered 2024-05-05: 4 mg via INTRAVENOUS

## 2024-05-05 MED ORDER — SODIUM CHLORIDE 0.9 % IV SOLN
INTRAVENOUS | Status: AC
  Filled 2024-05-05: qty 2

## 2024-05-05 MED ORDER — ALBUMIN HUMAN 5 % IV SOLN: INTRAVENOUS | Status: DC | PRN

## 2024-05-05 MED ORDER — OXYCODONE HCL 5 MG PO TABS
5.0000 mg | ORAL_TABLET | Freq: Four times a day (QID) | ORAL | Status: DC | PRN
  Administered 2024-05-05: 5 mg via ORAL
  Filled 2024-05-05: qty 1

## 2024-05-05 MED ORDER — SUGAMMADEX SODIUM 200 MG/2ML IV SOLN
INTRAVENOUS | Status: DC | PRN
  Administered 2024-05-05: 250 mg via INTRAVENOUS

## 2024-05-05 MED ORDER — IOHEXOL 350 MG/ML SOLN
INTRAVENOUS | Status: DC | PRN
Start: 2024-05-05 — End: 2024-05-05
  Administered 2024-05-05: 5 mL
  Administered 2024-05-05: 10 mL

## 2024-05-05 MED ORDER — SODIUM CHLORIDE 0.9% IV SOLUTION: Freq: Once | INTRAVENOUS | Status: DC

## 2024-05-05 MED ORDER — PHENYLEPHRINE 80 MCG/ML (10ML) SYRINGE FOR IV PUSH (FOR BLOOD PRESSURE SUPPORT)
PREFILLED_SYRINGE | INTRAVENOUS | Status: DC | PRN
  Administered 2024-05-05 (×2): 80 ug via INTRAVENOUS

## 2024-05-05 MED ORDER — GENTAMICIN IN SALINE 1.6-0.9 MG/ML-% IV SOLN
INTRAVENOUS | Status: AC
  Filled 2024-05-05: qty 50

## 2024-05-05 MED ORDER — LIDOCAINE 2% (20 MG/ML) 5 ML SYRINGE
INTRAMUSCULAR | Status: DC | PRN
  Administered 2024-05-05: 60 mg via INTRAVENOUS

## 2024-05-05 MED ORDER — ROCURONIUM BROMIDE 10 MG/ML (PF) SYRINGE
PREFILLED_SYRINGE | INTRAVENOUS | Status: DC | PRN
  Administered 2024-05-05: 60 mg via INTRAVENOUS

## 2024-05-05 MED ORDER — LACTATED RINGERS IV SOLN: INTRAVENOUS | Status: DC

## 2024-05-05 MED ORDER — MIDAZOLAM HCL 2 MG/2ML IJ SOLN
INTRAMUSCULAR | Status: AC
Start: 2024-05-05 — End: 2024-05-05
  Filled 2024-05-05: qty 2

## 2024-05-05 MED ORDER — FENTANYL CITRATE (PF) 250 MCG/5ML IJ SOLN
INTRAMUSCULAR | Status: DC | PRN
  Administered 2024-05-05: 100 ug via INTRAVENOUS

## 2024-05-05 NOTE — Anesthesia Preprocedure Evaluation (Addendum)
 Anesthesia Evaluation  Patient identified by MRN, date of birth, ID band Patient awake    Reviewed: Allergy & Precautions, NPO status , Patient's Chart, lab work & pertinent test results  History of Anesthesia Complications Negative for: history of anesthetic complications  Airway Mallampati: III  TM Distance: >3 FB Neck ROM: Full    Dental  (+) Dental Advisory Given   Pulmonary sleep apnea and Continuous Positive Airway Pressure Ventilation , Current Smoker and Patient abstained from smoking., PE   breath sounds clear to auscultation       Cardiovascular hypertension, Pt. on medications and Pt. on home beta blockers + CAD  + dysrhythmias Atrial Fibrillation + pacemaker  Rhythm:Regular Rate:Normal   Myocardial sarcoidosis  '25 TTE - EF 60 to 65%. There is moderate concentric left ventricular hypertrophy. Grade I diastolic dysfunction (impaired relaxation). No significant valvular d/o identified.     Neuro/Psych negative neurological ROS  negative psych ROS   GI/Hepatic negative GI ROS,,,(+)     substance abuse  alcohol use  Endo/Other  diabetes, Type 2, Oral Hypoglycemic Agents   Obesity   Renal/GU negative Renal ROS     Musculoskeletal  (+) Arthritis ,    Abdominal   Peds  Hematology  Elevated factor VIII, persistent + lupus anticoagulant On xarelto     Anesthesia Other Findings Chronic pain   Reproductive/Obstetrics                              Anesthesia Physical Anesthesia Plan  ASA: 3  Anesthesia Plan: General   Post-op Pain Management: Tylenol  PO (pre-op )*   Induction: Intravenous  PONV Risk Score and Plan: 2 and Treatment may vary due to age or medical condition, Ondansetron , Dexamethasone  and Midazolam   Airway Management Planned: Oral ETT  Additional Equipment: Arterial line  Intra-op Plan:   Post-operative Plan: Extubation in OR  Informed Consent: I have  reviewed the patients History and Physical, chart, labs and discussed the procedure including the risks, benefits and alternatives for the proposed anesthesia with the patient or authorized representative who has indicated his/her understanding and acceptance.     Dental advisory given  Plan Discussed with: CRNA and Anesthesiologist  Anesthesia Plan Comments: (2 large bore PIV. Will access femoral venous cannulation during procedure if necessary)         Anesthesia Quick Evaluation

## 2024-05-05 NOTE — Anesthesia Procedure Notes (Signed)
 Arterial Line Insertion Start/End11/21/2025 1:53 PM, 05/05/2024 1:53 PM Performed by: Arvell Edsel HERO, CRNA, CRNA  Patient location: Pre-op . Preanesthetic checklist: patient identified, IV checked, site marked, risks and benefits discussed, surgical consent, monitors and equipment checked, pre-op  evaluation, timeout performed and anesthesia consent Lidocaine  1% used for infiltration Right, radial was placed Hand hygiene performed , maximum sterile barriers used  and Seldinger technique used Allen's test indicative of satisfactory collateral circulation Attempts: 1 Following insertion, dressing applied and Biopatch. Post procedure assessment: normal  Patient tolerated the procedure well with no immediate complications.

## 2024-05-05 NOTE — H&P (Signed)
 Date: 05/05/24 ID:  Christopher Green, DOB 1970/10/13, MRN [id number] PCP: Chandra Toribio POUR, MD  [facility] Providers Cardiologist:  Dub Huntsman, DO Electrophysiologist:  Danelle Birmingham, MD    History of Present Illness: .   Christopher Green is a 53 y.o. 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 Birmingham 11/24/22, doing well, no changes were made.   He saw D. Dunn, PA 02/09/23 for recurrent syncope, suffered serious facial injuries, suspect to have been situational, perhaps cough at the hospital/ER. Pt described too many episodes to recall, no clear pattern, trigger by the note other then low BPs perhaps.he self stopped valsartan , hydrochlorothiazide  and reported no additional events since then. Device interrogation noted no findings to explain syncope, AFib CVR, A lead undersensing not new for him Orthostatics + (mild, asymptomatic) Advised to the ER given so many events and being on OAC, though he declined, planned for labs Suspected low BP Labs suggested dehydration, advised to stay off valsartan /hydrochlorothiazide  and his spironolactone  dose reduced. Discussed f/u w/EP to visit rhythm control options Also discussed concerns of ongoing a/c in the environment of recurrent syncope.     He saw CANDIE Ferrier PA 02/26/23, reported no ongoing symptoms or syncope, and was likely low BP, pressure this visit was elevated though had not taken his meds yet.  Planned to monitor at home   Subsequently had c.MRI done, D. Dunn, PA-C noted:  I discussed MRI report with the doctor that read the study. This shows severe thickening of the heart as we saw on echo, with appearance of whether this could be a process of inflammation of the heart called  sarcoidosis. Sarcoidosis can also cause heart rhythm issues leading to pacemakers which may link these in his medical history. The EF pump function was down slightly on this study but this may be due to him breathing during the study - echo recently had shown that EF was normal. Dr. Santo would recommend we get him set up for a PET sarcoid protocol study >> ordered and pending   I saw him 04/20/13 His device was already programmed VVI > and up to that programming change had been 100% AF Pt preferred to get his CM w/u underway > completed, also pending perhaps a back surgery and did not want to pursue rhythm management.   Subsequently following with the AHF team   Did undergo TEE/DCCV eventually 09/22/23 w/Dr. Nance   Last saw him 12/20/23, very limited by terrible back pain, unable to walk much, seeing neurology/neurosurgery teams He struggles with LE neuropathy pain and pedal (tip of his feet) edema, particularly as the day progresses. Meds don't seem to help He has a terrible back, w/u in progress with likely surgery coming for it W/u: Nonischemic cardiomyopathy with significant LVH.  Although he does not have the typical characteristics of cardiac sarcoid he does have patchy LGE on cardiac MRI.   Interestingly he has severe LVH that would be more consistent with hypertrophic cardiomyopathy.  Cardiac PET consistent with sarcoid.  He has now had a HRCT which has some hilar adenopathy. EBUS negative for sarcoid.   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  Discussed  need to consider PPM > ICD   Seeing Dr. Shlomo > OSA   Updated echo noted below     Today's visit is scheduled as a 6 mo visit ROS:    He reports feeling pretty good No overt symptoms or concerns Denies any CP, palpitations No dizzy spells, near syncope or syncope No rest SOB, mild DOE   Device information Abbott dual chamber PPM implanted  08/17/22 RV lead is in septal/LB area   Generator is a Assurity RA lead is LPA 1231 RV lead is LPA 1231   Arrhythmia/AAD hx Amiodarone  started 09/03/23 (AFib)   Studies Reviewed: SABRA     EKG not done today   DEVICE interrogation done today by industry and reviewed by myself and Dr. Waddell Glimpse and RV lead measurements are good RA sensing has declined, threshold is capturing in the A and stable There is a noisy signal on  the A channel, though rhythmic (and likely the TV) Very large RV signal on the atrial channel AMS EGMs reviewed are false for FF, noisy signal Some are true ATach/slow AFlutter with CVR No HVR episodes   12/30/23: TTE 1. Left ventricular ejection fraction, by estimation, is 60 to 65%. The  left ventricle has normal function. The left ventricle has no regional  wall motion abnormalities. There is moderate concentric left ventricular  hypertrophy. Left ventricular  diastolic parameters are consistent with Grade I diastolic dysfunction  (impaired relaxation).   2. Right ventricular systolic function is normal. The right ventricular  size is normal.   3. The mitral valve is normal in structure. No evidence of mitral valve  regurgitation. No evidence of mitral stenosis.   4. The aortic valve is tricuspid. There is mild calcification of the  aortic valve. Aortic valve regurgitation is not visualized. Aortic valve  sclerosis/calcification is present, without any evidence of aortic  stenosis.   5. The inferior vena cava is normal in size with greater than 50%  respiratory variability, suggesting right atrial pressure of 3 mmHg    ECHO: 02/12/23: LVEF 60 to 65% with normal RV function.  Severe LVH as per Dr Gardenia.    CMR (04/12/23): 1. Mild decrease in LVEF, LVEF 46%, in the setting of free breathing artifact.  2. There is severe hypertrophy of the LV septum. Though this meets criteria for hypertrophic cardiomyopathy, this is multi-focal late gadolinium  enhancement with increase in ECV signal; this is not present in the areas of hypertrophy. Consider PET imaging for inflammatory cardiomyopathy evaluation.   Cardiac PET (05/19/23):   FDG uptake findings are consistent with active myocardial inflammation/sarcoidosis.   FDG uptake was observed. FDG uptake was focal. FDG uptake was present in the apical to basal lateral, apical anterior and apex segment(s). LV perfusion is abnormal. Defect 1: There is a small defect with mild reduction in uptake present in the apical inferior location(s).   Left ventricular function is abnormal. Global function is moderately reduced. EF: 36%.   Coronary calcium  was present on the attenuation correction CT images. Moderate coronary calcifications were present. Coronary calcifications were present in the left anterior descending artery, left circumflex artery and right coronary artery distribution(s).   Electronically signed by Lonni Nanas, MD   Risk Assessment/Calculations:     Physical Exam:   VS:  There were no vitals taken for this visit.      Wt Readings from Last 3 Encounters:  01/27/24 273 lb (123.8 kg)  01/11/24 279 lb 12.8 oz (126.9 kg)  12/23/23  281 lb (127.5 kg)    GEN: Well nourished, well developed in no acute distress NECK: No JVD; No carotid bruits CARDIAC: RRR, no murmurs, rubs, gallops RESPIRATORY: CTA b/l without rales, wheezing or rhonchi  ABDOMEN: Soft, non-tender, non-distended, obese EXTREMITIES: No edema is appreciated today; No deformity    PPM site: is stable, no thinning, fluctuation, tethering   ASSESSMENT AND PLAN: .     Longstanding persistent AFib CHA2DS2Vasc is 4, on Xarelto , appropriately dosed Unclear burden given false episodes   PPM RA lead by last CXR has fallen/dislodged towards TV  Pacing the atrium with stable threshold Noisy signal from TV and RV signal Programmed DDI with AV conduction (long PR)   Discussed given HF team findings > recommendation  to pursue ICD (he recalled Dr. Gardenia discussing that and potentially a life saving device) Discussed his RA lead had moved and not functioning normally Dr. Waddell saw him today Recommend extraction of his RA lead Upgrade to ICD with HV RV lead and a new RA lead Patient is agreeable His RN/scheduling team will reach out to the patient with scheduling dates   NICM HCM Sarcoid Volume stable C/w Dr. Fulton   Secondary hypercoagulable state 2/2 AFib, DVT, Pulmonary Embolism, and Antiphospholipid Antibody Syndrome

## 2024-05-05 NOTE — Transfer of Care (Signed)
 Immediate Anesthesia Transfer of Care Note  Patient: Christopher Green  Procedure(s) Performed: LEAD EXTRACTION PPM GENERATOR REMOVAL ICD IMPLANT  Patient Location: PACU  Anesthesia Type:General  Level of Consciousness: awake, alert , and oriented  Airway & Oxygen Therapy: Patient Spontanous Breathing  Post-op Assessment: Report given to RN and Post -op Vital signs reviewed and stable  Post vital signs: Reviewed and stable  Last Vitals:  Vitals Value Taken Time  BP 143/76 05/05/24 19:15  Temp 36.9 C 05/05/24 19:10  Pulse 60 05/05/24 19:26  Resp 13 05/05/24 19:26  SpO2 94 % 05/05/24 19:26  Vitals shown include unfiled device data.  Last Pain:  Vitals:   05/05/24 1910  TempSrc:   PainSc: 0-No pain      Patients Stated Pain Goal: 0 (05/05/24 1331)  Complications: No notable events documented.

## 2024-05-05 NOTE — Progress Notes (Signed)
 Referring Physician:  Chandra Toribio POUR, MD [address] [place],  [place] [address]  Primary Physician:  Chandra Toribio POUR, MD  Discussed the use of AI scribe software for clinical note transcription with the patient, who gave verbal consent to proceed.  History of Present Illness Christopher Green is a 53 year old male with chronic neuropathy and back pain who presents for evaluation of his symptoms. He was referred by Dr. Chandra for pain management and further evaluation of his symptoms.  He has severe nighttime pain in his legs, back, and feet that disrupts sleep. Oxycodone  from his primary care doctor (100-140 tablets per month) provides incomplete relief, and pregabalin  and other medications have not helped. Financial constraints limit his access to pain management services.  He reports a prior fall landing on his back with severe pain and temporary immobility for 3 to 4 days. His back pain now fluctuates. He was told he has a small slip at L4-L5.   He notes a family history of significant back and neck problems requiring multiple surgeries in his uncle. He wishes to exhaust nonsurgical options before considering surgery, especially given his cardiac history and recent pacemaker placement.  He has diabetes, but the cause of his neuropathy is unclear. He denies alcohol use. He reports a blood disease with prior blood clots that required medication. His symptoms range from manageable to severely limiting on different days.  He has done physical therapy and is scheduled for further pain management interventions, including injections and potential treatment trials. He is currently seeing pain management and other specialists for ongoing evaluation.   Bowel/Bladder Dysfunction: none  Conservative measures:  Physical therapy: None Multimodal medical therapy including regular antiinflammatories: Oxycodone , Gabapentin , Tylenol , Ibuprofen, cymbalta   Injections:  Has done ESIs in past with  relief  Past Surgery: no  Christopher Green has no symptoms of cervical myelopathy. He has bilateral carpal tunnel, had surgery on the left.   The symptoms are causing a significant impact on the patient's life.   Review of Systems:  A 10 point review of systems is negative, except for the pertinent positives and negatives detailed in the HPI.  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 on auto CPAP   PE (pulmonary thromboembolism) (HCC) 05/25/2022   Peripheral polyneuropathy    Presence of permanent cardiac pacemaker    Tobacco abuse    Venous insufficiency     Past Surgical History: Past Surgical History:  Procedure Laterality Date   BRONCHIAL WASHINGS  08/16/2023   Procedure: IRRIGATION, BRONCHUS;  Surgeon: Shelah Lamar RAMAN, MD;  Location: [facility] ENDOSCOPY;  Service: Pulmonary;;   CARDIAC CATHETERIZATION  03/25/2018   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, mLAD, DIAG, pCX, pRCA)   CARDIOVERSION N/A 09/22/2023   Procedure: CARDIOVERSION;  Surgeon: Gardenia Led, DO;  Location: [facility] INVASIVE CV LAB;  Service: Cardiovascular;  Laterality: N/A;   CARPAL TUNNEL RELEASE Left    FINE NEEDLE ASPIRATION  08/16/2023   Procedure: FINE NEEDLE ASPIRATION (FNA) LINEAR;  Surgeon: Shelah Lamar RAMAN, MD;  Location: [facility] ENDOSCOPY;  Service: Pulmonary;;   PACEMAKER IMPLANT N/A 08/17/2022   Procedure: PACEMAKER IMPLANT;  Surgeon: Waddell Danelle ORN, MD;  Location: [facility] INVASIVE CV LAB;  Service: Cardiovascular;   Laterality: N/A;   TRANSESOPHAGEAL ECHOCARDIOGRAM (CATH LAB) N/A 09/22/2023   Procedure: TRANSESOPHAGEAL ECHOCARDIOGRAM;  Surgeon: Gardenia Led, DO;  Location: [facility] INVASIVE CV LAB;  Service: Cardiovascular;  Laterality: N/A;   VIDEO BRONCHOSCOPY WITH ENDOBRONCHIAL ULTRASOUND N/A 08/16/2023   Procedure: BRONCHOSCOPY, WITH EBUS;  Surgeon: Shelah Lamar RAMAN, MD;  Location[facility] ENDOSCOPY;  Service: Pulmonary;  Laterality: N/A;    Allergies: Allergies as of 05/17/2024   (No Known Allergies)    Medications: Facility-Administered Encounter Medications as of 05/17/2024  Medication   0.9 %  sodium chloride  infusion (Manually program via Guardrails IV Fluids)   0.9 %  sodium chloride  infusion   0.9 %  sodium chloride  infusion   acetaminophen  (TYLENOL ) tablet 1,000 mg   ceFAZolin  (ANCEF ) IVPB 3g/150 mL premix   chlorhexidine  (HIBICLENS ) 4 % liquid 4 Application   chlorhexidine  (PERIDEX ) 0.12 % solution   gentamicin  (GARAMYCIN ) 1.6-0.9 MG/ML-% IVPB   gentamicin  (GARAMYCIN ) 80 mg in sodium chloride  0.9 % 500 mL irrigation   insulin  aspart (novoLOG ) injection 0-14 Units   lactated ringers  infusion   povidone-iodine  10 % swab 2 Application   Outpatient Encounter Medications as of 05/17/2024  Medication Sig   albuterol  (VENTOLIN  HFA) 108 (90 Base) MCG/ACT inhaler Inhale 2 puffs into the lungs every 6 (six) hours as needed for wheezing or shortness of breath.   amiodarone  (PACERONE ) 200 MG tablet Take 1 tablet (200 mg total) by mouth daily.   amitriptyline  (ELAVIL ) 10 MG tablet Take 1 tablet (10 mg total) by mouth at bedtime.   atorvastatin  (LIPITOR) 40 MG tablet TAKE 1 TABLET (40 MG TOTAL) BY MOUTH DAILY.   cetirizine  (ZYRTEC ) 10 MG tablet TAKE 1 TABLET (10 MG TOTAL) BY MOUTH DAILY.   DULoxetine  (CYMBALTA ) 30 MG capsule Take 1 capsule (30 mg total) by mouth as needed (nerve pain).   empagliflozin  (JARDIANCE ) 10 MG TABS tablet Take 1 tablet (10 mg total) by mouth daily before breakfast.   icosapent   Ethyl (VASCEPA ) 1 g capsule TAKE 2 CAPSULES (2 G TOTAL) BY MOUTH 2 (TWO) TIMES DAILY.   metFORMIN  (GLUCOPHAGE -XR) 750 MG 24 hr tablet TAKE 2 TABLETS (1,500 MG TOTAL) BY MOUTH DAILY.   methylPREDNISolone  (MEDROL  DOSEPAK) 4 MG TBPK tablet Take by mouth daily, taper daily dose per package instructions.   metoprolol  succinate (TOPROL -XL) 25 MG 24 hr tablet Take 0.5 tablets (12.5 mg total) by mouth at bedtime. Take with or immediately following a meal.   oxyCODONE -acetaminophen  (PERCOCET) 10-325 MG tablet TAKE 1 TABLET BY MOUTH EVERY 4 TO 6 HOURS AS NEEDED FOR PAIN   potassium chloride  SA (KLOR-CON  M) 20 MEQ tablet TAKE 1 TABLET BY MOUTH 2 TIMES DAILY.   pregabalin  (LYRICA ) 200 MG capsule Take 1 capsule (200 mg total) by mouth daily.   rivaroxaban  (XARELTO ) 20 MG TABS tablet Take 1 tablet (20 mg total) by mouth daily with supper.   sacubitril-valsartan  (ENTRESTO ) 24-26 MG TAKE 1 TABLET BY MOUTH TWICE DAILY.   sildenafil  (VIAGRA ) 100 MG tablet Take 0.5-1 tablets (50-100 mg total) by mouth daily as needed for erectile dysfunction.   spironolactone  (ALDACTONE ) 25 MG tablet Take 0.5  tablets (12.5 mg total) by mouth at bedtime.   testosterone  cypionate (DEPOTESTOSTERONE CYPIONATE) 200 MG/ML injection INJECT 0.5 ML INTO THE MUSCLE EVERY 14 DAYS.   tiZANidine  (ZANAFLEX ) 4 MG tablet Take 1 tablet (4 mg total) by mouth 3 (three) times daily.   torsemide  (DEMADEX ) 20 MG tablet Take 1 tablet (20 mg total) by mouth every other day.    Social History: Social History   Tobacco Use   Smoking status: Every Day    Types: Cigarettes    Passive exposure: Current   Smokeless tobacco: Never   Tobacco comments:    1ppd 08/26/23  Vaping Use   Vaping status: Never Used  Substance Use Topics   Alcohol use: Yes    Comment: daily   Drug use: Never    1 pack per day  Family Medical History: Family History  Problem Relation Age of Onset   Heart attack Father     Physical Examination:   Strength:  Side  Iliopsoas Quads Hamstring PF DF EHL  R 5 5 5 5 5 5   L 5 5 5 5 5 5    Reflexes are 1+ and symmetric at the  patella and achilles.     Clonus is not present.   Decreased sensation in bilateral lower extremities. Gait is abnormal- he limps.   Medical Decision Making  Imaging:  EXAM: MRI LUMBAR SPINE WITHOUT CONTRAST   TECHNIQUE: Multiplanar, multisequence MR imaging of the lumbar spine was performed. No intravenous contrast was administered.   COMPARISON:  April 12, 2019   FINDINGS: Bone marrow: No significant abnormality   Conus and cauda equina: No significant abnormality   Paraspinal tissues: No significant abnormality   L1-L2: The disc is normal. There is mild facet arthropathy. No spinal stenosis or foraminal stenosis   L2-L3: There is mild degenerative disc disease with a mild disc bulge. There is facet arthropathy with facet enlargement and moderate spinal stenosis due to a small spinal canal.   L3-L4: There is mild degenerative disc disease with a mild disc bulge. There is mild facet arthropathy. There is no spinal stenosis or foraminal stenosis.   L4-L5: There is mild degenerative disc disease with a mild disc bulge. There is severe bilateral facet arthropathy with a slight degenerative spondylolisthesis. There is moderate spinal stenosis and mild bilateral neural foraminal stenosis   L5-S1: There is a mild disc bulge. There is mild facet arthropathy. No spinal stenosis or foraminal stenosis   IMPRESSION: No compression fracture or acute abnormality.   Congenitally small spinal canal.   There is severe facet arthropathy at L4-5 with a slight degenerative spondylolisthesis and moderate spinal stenosis.   Moderate spinal stenosis at L2-3 due to disc bulge, facet arthropathy and small spinal canal.  EXAM: LUMBAR SPINE - COMPLETE 4+ VIEW   COMPARISON:  08/21/2019   FINDINGS: Negative for fracture or dislocation. Mild narrowing of interspaces L2-L5,  with small anterior endplate spurs. Interval increase in grade 1 anterolisthesis L4-5 since previous exam. No definite dynamic instability on flexion/extension. Aortic Atherosclerosis (ICD10-170.0).   IMPRESSION: 1. No acute findings. 2. Mild multilevel degenerative disc disease, with some increase in grade 1 anterolisthesis L4-5.   I have personally reviewed the images and agree with the above interpretation.  Assessment and Plan Assessment & Plan Lumbar spondylolisthesis with congenital spinal stenosis Moderate lumbar spondylolisthesis at L4-L5 with congenital spinal stenosis causing intermittent back pain. Symptoms are variable, with some days better than others. No immediate need for spine surgery due to moderate  severity and his preference to avoid surgery. Spinal cord stimulator evaluation considered as a less invasive option to address both back and leg pain. - Sent message to Dr. Marcelino to discuss potential spinal cord stimulator trial for painful neuropathy, possible assistance with chronic pain syndrome and chronic back pain - Will consider psychological evaluation as part of spinal cord stimulator workup.  Painful peripheral neuropathy Chronic painful peripheral neuropathy, likely multifactorial, with symptoms exacerbated at night. Currently on pregabalin  and other medications. Neuropathy may be related to diabetes, though not definitively diagnosed. Spinal cord stimulator considered as a potential treatment option to alleviate symptoms. Qutenza patches being used for neuropathy management. - Sent message to Dr. Marcelino to discuss spinal cord stimulator trial for neuropathy. - Sent message to Dr. Strudelman to coordinate care and discuss current neuropathy management. - Continue current neuropathy management with Qutenza patches.  Penne LELON Sharps MD [facility] Neurosurgery

## 2024-05-05 NOTE — Anesthesia Procedure Notes (Signed)
 Procedure Name: Intubation Date/Time: 05/05/2024 3:31 PM  Performed by: Arvell Edsel HERO, CRNAPre-anesthesia Checklist: Patient identified, Emergency Drugs available, Suction available, Patient being monitored and Timeout performed Patient Re-evaluated:Patient Re-evaluated prior to induction Oxygen Delivery Method: Circle system utilized Preoxygenation: Pre-oxygenation with 100% oxygen Induction Type: IV induction Ventilation: Oral airway inserted - appropriate to patient size and Two handed mask ventilation required Laryngoscope Size: Glidescope and 4 Grade View: Grade I Tube type: Oral Tube size: 8.0 mm Number of attempts: 1 Airway Equipment and Method: Video-laryngoscopy and Patient positioned with wedge pillow Placement Confirmation: ETT inserted through vocal cords under direct vision, positive ETCO2 and breath sounds checked- equal and bilateral Secured at: 23 cm Tube secured with: Tape Dental Injury: Teeth and Oropharynx as per pre-operative assessment

## 2024-05-05 NOTE — Interval H&P Note (Signed)
 History and Physical Interval Note:  05/05/2024 2:43 PM  Christopher Green  has presented today for surgery, with the diagnosis of lead failure - myocardial sarcoidosis.  The various methods of treatment have been discussed with the patient and family. After consideration of risks, benefits and other options for treatment, the patient has consented to  Procedure(s): LEAD EXTRACTION (N/A) PPM GENERATOR REMOVAL (N/A) ICD IMPLANT (N/A) as a surgical intervention.  The patient's history has been reviewed, patient examined, no change in status, stable for surgery.  I have reviewed the patient's chart and labs.  Questions were answered to the patient's satisfaction.     Donnice DELENA Primus

## 2024-05-06 ENCOUNTER — Ambulatory Visit (HOSPITAL_COMMUNITY)

## 2024-05-06 ENCOUNTER — Encounter (HOSPITAL_COMMUNITY): Payer: Self-pay | Admitting: Internal Medicine

## 2024-05-06 DIAGNOSIS — I2510 Atherosclerotic heart disease of native coronary artery without angina pectoris: Secondary | ICD-10-CM | POA: Diagnosis not present

## 2024-05-06 DIAGNOSIS — Z7901 Long term (current) use of anticoagulants: Secondary | ICD-10-CM | POA: Diagnosis not present

## 2024-05-06 DIAGNOSIS — T82120A Displacement of cardiac electrode, initial encounter: Secondary | ICD-10-CM | POA: Diagnosis not present

## 2024-05-06 DIAGNOSIS — F17200 Nicotine dependence, unspecified, uncomplicated: Secondary | ICD-10-CM | POA: Diagnosis not present

## 2024-05-06 DIAGNOSIS — I422 Other hypertrophic cardiomyopathy: Secondary | ICD-10-CM | POA: Diagnosis not present

## 2024-05-06 DIAGNOSIS — E119 Type 2 diabetes mellitus without complications: Secondary | ICD-10-CM | POA: Diagnosis not present

## 2024-05-06 DIAGNOSIS — D6869 Other thrombophilia: Secondary | ICD-10-CM | POA: Diagnosis not present

## 2024-05-06 DIAGNOSIS — I4811 Longstanding persistent atrial fibrillation: Secondary | ICD-10-CM | POA: Diagnosis not present

## 2024-05-06 DIAGNOSIS — Z95810 Presence of automatic (implantable) cardiac defibrillator: Secondary | ICD-10-CM | POA: Diagnosis not present

## 2024-05-06 DIAGNOSIS — G4733 Obstructive sleep apnea (adult) (pediatric): Secondary | ICD-10-CM | POA: Diagnosis not present

## 2024-05-06 DIAGNOSIS — I442 Atrioventricular block, complete: Secondary | ICD-10-CM | POA: Diagnosis not present

## 2024-05-06 DIAGNOSIS — D869 Sarcoidosis, unspecified: Secondary | ICD-10-CM | POA: Diagnosis not present

## 2024-05-06 DIAGNOSIS — I10 Essential (primary) hypertension: Secondary | ICD-10-CM | POA: Diagnosis not present

## 2024-05-06 MED ORDER — OXYCODONE HCL 5 MG PO TABS
5.0000 mg | ORAL_TABLET | Freq: Once | ORAL | Status: AC
  Administered 2024-05-06: 5 mg via ORAL
  Filled 2024-05-06: qty 1

## 2024-05-06 MED ORDER — TORSEMIDE 20 MG PO TABS: 20.0000 mg | ORAL_TABLET | ORAL | 6 refills | Status: DC

## 2024-05-06 MED ORDER — OXYCODONE HCL 5 MG PO TABS
10.0000 mg | ORAL_TABLET | Freq: Four times a day (QID) | ORAL | Status: DC | PRN
Start: 2024-05-06 — End: 2024-05-06
  Administered 2024-05-06: 10 mg via ORAL
  Filled 2024-05-06: qty 2

## 2024-05-06 NOTE — Progress Notes (Signed)
 Discharge Nurse Summary: DC order noted per MD. DC RN at bedside with patient. Patient agreeable with discharge plan, family at the bedside. PIV removed, skin intact. No DME needs. No home/TOC meds. CP/Edu resolved. Telemonitor returned to charging station. All belongings accounted for. Patient walked off the unit before AVS could be reviewed.   Charge RN called patient instructing patient to return for dc instructions. DC RN met the patient downstairs at front entrance, AVS printed/reviewed. All questions answered. Patient discharged from the unit.  Rosario EMERSON Lund, RN

## 2024-05-06 NOTE — Progress Notes (Signed)
 ADVANCED HEART FAILURE FOLLOW UP CLINIC NOTE  Referring Physician: Chandra Toribio POUR, MD  Primary Care: Chandra Toribio POUR, MD Primary Cardiologist:  HPI: Christopher Green is a 53 y.o. male who presents for follow up of chronic systolic heart failure:.      [name] first established care with [facility] in October 2019 when he had an abnormal nuclear stress test followed by left heart catheterization in 03/2018 with reportedly minimal plaque.  During that time he was also seen for uncontrolled hypertension in the emergency department.  He was not seen by [facility] health cardiology until December 2023 when he presented to [facility] with complaints of shortness of breath and a systolic blood pressure over 200 and heart rate of 40.  CTA PE on admission was significant for acute segmental and subsegmental pulmonary emboli.  While admitted patient was also significantly bradycardic with Mobitz 2 heart block.  Echocardiogram during admission with LVEF of 65 to 70%.  Due to persistent symptoms of dyspnea and 2-1 AV block with right bundle branch block on EKG he underwent placement of permanent pacemaker by Dr. Danelle in 2024.  Despite placement of permanent pacemaker he had multiple episodes of syncope in August 2024.  Device interrogation by EP with atrial fibrillation with controlled ventricular rates and atrial flutter.  During this time he also had intermittent low blood pressures that were associated with his syncope but etiology could not be determined.  He subsequently underwent cardiac MRI on 10/24 which demonstrated severe LVH of the septum with multifocal LGE that was not present in the areas of hypertrophy.  Patient subsequently sent to heart failure clinic for further evaluation.   He reports that his father passed in his 80s from a 'heart attack' but did not undergo LHC.   HRCT with hilar adenopathy, but EBUS with no signs of sarcoidosis.      SUBJECTIVE:  Patient presents today for regular  scheduled follow-up.  He overall is doing well and denies any symptoms of worsening orthopnea or dyspnea on exertion.  He is scheduled for a right atrial lead revision as well as CRT-D upgrade given his significant LGE and pacing burden.  PMH, current medications, allergies, social history, and family history reviewed in epic.  PHYSICAL EXAM: Vitals:   05/03/24 0944  BP: (!) 102/56  Pulse: 60  SpO2: 97%   GENERAL: Well nourished and in no apparent distress at rest.  PULM:  Normal work of breathing, clear to auscultation bilaterally. Respirations are unlabored.  CARDIAC:  JVP: Flat         Normal rate with regular rhythm. No murmurs, rubs or gallops.  No edema. Warm and well perfused extremities. ABDOMEN: Soft, non-tender, non-distended. NEUROLOGIC: Patient is oriented x3 with no focal or lateralizing neurologic deficits.    DATA REVIEW  ECG: 05/28/23: Atrial flutter with intermittent pacing    ECHO: 02/12/23: LVEF 60 to 65% with normal RV function.  Severe LVH  7/25: EF 60-65%  CATH: 2019. Mild, nonobstructive  CMR: LVEF 46%, severe hypertrophy of the LV septum multifocal LGE not present in the areas of hypertrophy, PET scan recommended  PET: Read as positive with FDG uptake in the apical to basal lateral, apical anterior, and apical segments.  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  This corresponds with MYH6 mutation.  Was worked up for sarcoidosis with PET scan read as positive, however given negative EBUS and genetic testing results favor more related to HCM. - Stable NYHA class II - Euvolemic, continue torsemide  every other day - Continue entresto  24/26mg  BID, metoprolol  12.5mg  XL nightly, should be able to increase post CRT - Agree with CRT-D upgrade -  Continue spironolactone  12.5mg  daily - Continue jardiance  10mg  daily  PAF: S/p DCCV on 09/2023.  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  Obesity  Body mass index is 36.15 kg/m. He has tried Ozempic  but had nausea so he stopped. He does not want to re challenge.      Follow up in 3 months  I spent 43 minutes caring for this patient today including face to face time, ordering and reviewing labs, reviewing records from HF visits, his extensive HF workup including reviewing imaging results, seeing the patient, documenting in the record, and arranging follow ups.   [name], MD Advanced Heart Failure Mechanical Circulatory Support 05/06/24

## 2024-05-06 NOTE — Discharge Summary (Addendum)
 Discharge Summary   Patient ID: Christopher Green MRN: [id number]; DOB: Aug 30, 1970  Admit date: 05/05/2024 Discharge date: 05/06/2024  PCP:  Chandra Toribio POUR, MD   [facility] Providers Cardiologist:  Dub Huntsman, DO  Electrophysiologist:  Danelle Birmingham, MD      Discharge Diagnoses  Principal Problem:   Atrial pacemaker lead displacement Active Problems:   Essential hypertension   Hyperlipidemia LDL goal <70   Obesity (BMI 30-39.9)   Diabetes mellitus (HCC)   Syncope   Persistent atrial fibrillation (HCC)   Coronary artery disease involving native coronary artery of native heart without angina pectoris   LVH (left ventricular hypertrophy)   Nonischemic cardiomyopathy (HCC)   OSA on CPAP   Diagnostic Studies/Procedures   Lead extraction, ICD placement 05/05/24 Conclusion: Successful extraction of a Saint Jude dual-chamber pacemaker where the atrial lead had dislodged and the patient with a recent diagnosis of cardiac sarcoid was felt to need biventricular ICD insertion secondary to heart block in conjunction with an MRI ejection fraction of 36%. A successful Saint Jude biventricular ICD was placed with no immediate procedural complications.   _____________   History of Present Illness   Christopher Green is a 53 y.o. 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  Heart catheterization 2019 with no obstructive CAD.  Was seen 01/2023 for recurrent syncope.  PPM interrogation demonstrated a lead under sensing which was not new for him.  He reported orthostatic hypotension and reduced his blood pressure medication.  Valsartan  and HCTZ were discontinued and his spironolactone  dose was reduced.  He followed up with the EP to discuss ongoing concerns for recurrent syncope.  He underwent cardiac MRI that demonstrated severe  thickening of the heart consistent with images on echo and felt likely due to sarcoidosis.  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  He was referred to advanced heart failure clinic.   He was started on amiodarone  08/2023.  He underwent TEE guided cardioversion 09/2023.   Imaging results have been discussed with differential diagnosis HCM versus cardiac sarcoid.  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  He presented for lead extraction and upgrade to ICD.  Hospital Course   Consultants: None  Recurrent syncope Conduction disease Underwent successful lead extraction and placement of ICD Follow-up chest x-ray negative for pneumothorax Device parameters interrogated this morning and appropriate.   Longstanding persistent atrial fibrillation Chronic anticoagulation - Remains on 200 mg amiodarone , 12.5 mg Toprol  -Appropriately dosed on 20 mg Xarelto  - resume on Monday evening   HCM -Recent issues with orthostatic hypotension - Is now maintained on 24-26 Entresto  twice daily, 12.5 mg spironolactone , 10 mg Jardiance    Device site C/D/I CXR negative for pneumothorax Follow-up has been arranged      Did the patient have an acute coronary syndrome (MI, NSTEMI, STEMI, etc) this admission?:  No                               Did the patient have a percutaneous coronary intervention (stent / angioplasty)?:  No.        The patient will be scheduled for a TOC follow up appointment in 7-14 days.  A message has been sent to the [facility] and Scheduling Pool at the office where the patient  should be seen for follow up.  _____________  Discharge Vitals Blood pressure 127/65, pulse 60, temperature 98 F (36.7 C), temperature source Oral, resp. rate 19, height 6' 1 (1.854 m), weight 125.8 kg, SpO2 95%.  Filed Weights   05/05/24 1301 05/05/24 2015  Weight: 118.8 kg 125.8 kg    Labs & Radiologic Studies  CBC No results for input(s): WBC, NEUTROABS,  HGB, HCT, MCV, PLT in the last 72 hours. Basic Metabolic Panel No results for input(s): NA, K, CL, CO2, GLUCOSE, BUN, CREATININE, CALCIUM , MG, PHOS in the last 72 hours. Liver Function Tests No results for input(s): AST, ALT, ALKPHOS, BILITOT, PROT, ALBUMIN  in the last 72 hours. No results for input(s): LIPASE, AMYLASE in the last 72 hours. High Sensitivity Troponin:   No results for input(s): TROPONINIHS in the last 720 hours.  No results for input(s): TRNPT in the last 720 hours.  BNP Invalid input(s): POCBNP No results for input(s): PROBNP in the last 72 hours.  No results for input(s): BNP in the last 72 hours.  D-Dimer No results for input(s): DDIMER in the last 72 hours. Hemoglobin A1C No results for input(s): HGBA1C in the last 72 hours. Fasting Lipid Panel No results for input(s): CHOL, HDL, LDLCALC, TRIG, CHOLHDL, LDLDIRECT in the last 72 hours. No results found for: LIPOA  Thyroid  Function Tests No results for input(s): TSH, T4TOTAL, T3FREE, THYROIDAB in the last 72 hours.  Invalid input(s): FREET3 _____________  DG Chest 2 View Result Date: 05/06/2024 EXAM: 2 VIEW(S) XRAY OF THE CHEST 05/06/2024 06:33:24 AM COMPARISON: 12/08/22 CLINICAL HISTORY: ICD (implantable cardioverter-defibrillator) in place. FINDINGS: LUNGS AND PLEURA: No focal pulmonary opacity. . No pleural effusion. No pneumothorax. HEART AND MEDIASTINUM: Left chest AICD/pacemaker with leads overlying right atrium, right ventricle, and coronary sinus. No acute abnormality of the cardiac and mediastinal silhouettes. BONES AND SOFT TISSUES: Multilevel degenerative changes of thoracic spine. IMPRESSION: 1. Status post placement of a new left chest wall AICD with leads in the right atrial appendage, right ventricle and coronary sinus. No pneumothorax visualized after device placement. . Electronically signed by: Waddell Calk MD 05/06/2024  06:51 AM EST RP Workstation: [id number]   EP PPM/ICD IMPLANT Result Date: 05/05/2024 Conclusion: Successful extraction of a Saint Jude dual-chamber pacemaker where the atrial lead had dislodged and the patient with a recent diagnosis of cardiac sarcoid was felt to need biventricular ICD insertion secondary to heart block in conjunction with an MRI ejection fraction of 36%.  A successful Saint Jude biventricular ICD was placed with no immediate procedural complications. Danelle Waddell, MD    Disposition Pt is being discharged home today in good condition.  Follow-up Plans & Appointments  Discharge Instructions     Diet - low sodium heart healthy   Complete by: As directed    Discharge instructions   Complete by: As directed    Increase activity slowly   Complete by: As directed    No wound care   Complete by: As directed        Discharge Medications Allergies as of 05/06/2024   No Known Allergies      Medication List     PAUSE taking these medications    rivaroxaban  20 MG Tabs tablet Wait to take this until: May 08, 2024 Commonly known as: XARELTO  Take 1 tablet (20 mg total) by mouth daily with supper.       TAKE these medications    albuterol  108 (90 Base) MCG/ACT inhaler Commonly known as: VENTOLIN  HFA Inhale  2 puffs into the lungs every 6 (six) hours as needed for wheezing or shortness of breath.   amiodarone  200 MG tablet Commonly known as: PACERONE  Take 1 tablet (200 mg total) by mouth daily.   amitriptyline  10 MG tablet Commonly known as: ELAVIL  Take 1 tablet (10 mg total) by mouth at bedtime.   atorvastatin  40 MG tablet Commonly known as: LIPITOR TAKE 1 TABLET (40 MG TOTAL) BY MOUTH DAILY.   cetirizine  10 MG tablet Commonly known as: ZYRTEC  TAKE 1 TABLET (10 MG TOTAL) BY MOUTH DAILY. What changed: when to take this   empagliflozin  10 MG Tabs tablet Commonly known as: Jardiance  Take 1 tablet (10 mg total) by mouth daily before breakfast.    icosapent  Ethyl 1 g capsule Commonly known as: VASCEPA  TAKE 2 CAPSULES (2 G TOTAL) BY MOUTH 2 (TWO) TIMES DAILY.   metFORMIN  750 MG 24 hr tablet Commonly known as: GLUCOPHAGE -XR TAKE 2 TABLETS (1,500 MG TOTAL) BY MOUTH DAILY. What changed:  how much to take when to take this   metoprolol  succinate 25 MG 24 hr tablet Commonly known as: TOPROL -XL Take 0.5 tablets (12.5 mg total) by mouth at bedtime. Take with or immediately following a meal. What changed: when to take this   oxyCODONE -acetaminophen  10-325 MG tablet Commonly known as: PERCOCET TAKE 1 TABLET BY MOUTH EVERY 4 TO 6 HOURS AS NEEDED FOR PAIN   potassium chloride  SA 20 MEQ tablet Commonly known as: KLOR-CON  M TAKE 1 TABLET BY MOUTH 2 TIMES DAILY.   pregabalin  200 MG capsule Commonly known as: LYRICA  Take 1 capsule (200 mg total) by mouth daily. What changed:  when to take this reasons to take this   sacubitril-valsartan  24-26 MG Commonly known as: ENTRESTO  TAKE 1 TABLET BY MOUTH TWICE DAILY.   sildenafil  100 MG tablet Commonly known as: Viagra  Take 0.5-1 tablets (50-100 mg total) by mouth daily as needed for erectile dysfunction.   spironolactone  25 MG tablet Commonly known as: ALDACTONE  Take 0.5 tablets (12.5 mg total) by mouth at bedtime. What changed: when to take this   testosterone  cypionate 200 MG/ML injection Commonly known as: DEPOTESTOSTERONE CYPIONATE INJECT 0.5 ML INTO THE MUSCLE EVERY 14 DAYS.   tiZANidine  4 MG tablet Commonly known as: Zanaflex  Take 1 tablet (4 mg total) by mouth 3 (three) times daily. What changed:  when to take this reasons to take this   torsemide  20 MG tablet Commonly known as: DEMADEX  Take 1 tablet (20 mg total) by mouth every other day.         Outstanding Labs/Studies    Duration of Discharge Encounter: APP Time: 25 minutes   Signed, Jon Nat Hails, PA 05/06/2024, 10:12 AM

## 2024-05-06 NOTE — Plan of Care (Signed)
  Problem: Education: Goal: Knowledge of cardiac device and self-care will improve Outcome: Progressing Goal: Ability to safely manage health related needs after discharge will improve Outcome: Progressing Goal: Individualized Educational Video(s) Outcome: Progressing   Problem: Cardiac: Goal: Ability to achieve and maintain adequate cardiopulmonary perfusion will improve Outcome: Progressing   Problem: Education: Goal: Knowledge of General Education information will improve Description: Including pain rating scale, medication(s)/side effects and non-pharmacologic comfort measures Outcome: Progressing   Problem: Health Behavior/Discharge Planning: Goal: Ability to manage health-related needs will improve Outcome: Progressing   Problem: Clinical Measurements: Goal: Ability to maintain clinical measurements within normal limits will improve Outcome: Progressing Goal: Will remain free from infection Outcome: Progressing Goal: Diagnostic test results will improve Outcome: Progressing Goal: Respiratory complications will improve Outcome: Progressing Goal: Cardiovascular complication will be avoided Outcome: Progressing   Problem: Nutrition: Goal: Adequate nutrition will be maintained Outcome: Progressing   Problem: Coping: Goal: Level of anxiety will decrease Outcome: Progressing   Problem: Elimination: Goal: Will not experience complications related to bowel motility Outcome: Progressing Goal: Will not experience complications related to urinary retention Outcome: Progressing   Problem: Pain Managment: Goal: General experience of comfort will improve and/or be controlled Outcome: Progressing   Problem: Safety: Goal: Ability to remain free from injury will improve Outcome: Progressing   Problem: Skin Integrity: Goal: Risk for impaired skin integrity will decrease Outcome: Progressing

## 2024-05-06 NOTE — Progress Notes (Signed)
 Pt unable to comply with bedrest order due to chronic back pain. Pt sitting up in the bed and on the edge of the bed since approximately 2100. Pt states he can not lie still or flat due to his back pain. Order obtained for 5mg  oxycodone  q6h PRN and administered. Pt growing increasingly restless and frequently asking when he can get OOB. Pt diaphoretic and agitated saying he works third shift and is normally up for the day by now. Pt says back pain is still at a 7 and being able to move around is helping his pain. One time dose of 5mg  oxycodone  obtained and administered. Pt has refused the majority of his post-op vitals due to the BP cuff causing agitation. Pt wanting to eat constantly. Unable to redirect pt or educate patient regarding current orders because he states, I don't care. On-call cardiology Dr. Debarah updated.

## 2024-05-06 NOTE — Discharge Instructions (Signed)
 After Your ICD (Implantable Cardiac Defibrillator)      You have a St Jude ICD  Do not lift your arm above shoulder height for 1 week after your procedure. After 7 days, you may progress as below.     Saturday May 13, 2024  Sunday May 14, 2024 Monday May 15, 2024 Tuesday May 16, 2024   Do not lift, push, pull, or carry anything over 10 pounds with the affected arm until 6 weeks after your procedure.   Monitor your defibrillator site for redness, swelling, and drainage. Call the device clinic at [phone] if you experience these symptoms or fever/chills.  If your incision is closed with Dermabond:  You may shower 1 day after your defibrillator implant and wash your incision with soap and water. Avoid lotions, ointments, or perfumes over your incision until it is well-healed.  You may use a hot tub or a pool AFTER your wound check appointment if the incision is completely closed.  Your ICD is designed to protect you from life threatening heart rhythms. Because of this, you may receive a shock.   1 shock with no symptoms:  Call the office during business hours. 1 shock with symptoms (chest pain, chest pressure, dizziness, lightheadedness, shortness of breath, overall feeling unwell):  Call 911. If you experience 2 or more shocks in 24 hours:  Call 911. If you receive a shock, you should not drive for 6 months per the [place] DMV IF you receive appropriate therapy from your ICD.   ICD Alerts:  Some alerts are vibratory and others beep. These are NOT emergencies. Please call our office to let us  know. If this occurs at night or on weekends, it can wait until the next business day. Send a remote transmission.  If your device is capable of reading fluid status (for heart failure), you will be offered monthly monitoring to review this with you.   Remote monitoring is used to monitor your ICD from home. This monitoring is scheduled every 91 days by our office. It allows us  to  keep an eye on the functioning of your device to ensure it is working properly. You will routinely see your Electrophysiologist annually (more often if necessary).

## 2024-05-07 LAB — TYPE AND SCREEN
ABO/RH(D): A POS
Antibody Screen: NEGATIVE
Unit division: 0
Unit division: 0
Unit division: 0
Unit division: 0

## 2024-05-07 LAB — BPAM RBC
Blood Product Expiration Date: 202512182359
Blood Product Expiration Date: 202512182359
Blood Product Expiration Date: 202512182359
Blood Product Expiration Date: 202512182359
ISSUE DATE / TIME: 202511221830
ISSUE DATE / TIME: 202511222234
Unit Type and Rh: 6200
Unit Type and Rh: 6200
Unit Type and Rh: 6200
Unit Type and Rh: 6200

## 2024-05-08 MED FILL — Lidocaine HCl Local Inj 1%: INTRAMUSCULAR | Qty: 60 | Status: AC

## 2024-05-09 ENCOUNTER — Telehealth: Payer: Self-pay | Admitting: Internal Medicine

## 2024-05-09 LAB — ECHO INTRAOPERATIVE TEE
AR max vel: 3.08 cm2
AV Area VTI: 3.84 cm2
AV Area mean vel: 3.42 cm2
AV Mean grad: 3 mmHg
AV Peak grad: 7.5 mmHg
Ao pk vel: 1.37 m/s
Height: 73 in
S' Lateral: 3.85 cm
Weight: 4192 [oz_av]

## 2024-05-09 NOTE — Telephone Encounter (Signed)
 Called patient back and asked if patient had discharge paperwork from his implant day and patient stated that he threw it away.  This RN discussed lifting restrictions (nothing >10lbs) and driving restrictions until 2 weeks post op and patient verbalized understanding  Patient may shower since dermabond was used over incision site  Patient advised to watch for s/s of bleeding and infection and patient verbalized understanding  Patient has 10-14 day post implant check next week and patient verbalized understanding   All questions and concerns addressed at time of call  Patient appreciative of call back

## 2024-05-09 NOTE — Anesthesia Postprocedure Evaluation (Signed)
 Anesthesia Post Note  Patient: Christopher Green  Procedure(s) Performed: LEAD EXTRACTION PPM GENERATOR REMOVAL ICD IMPLANT     Patient location during evaluation: Cath Lab Anesthesia Type: General Level of consciousness: awake and alert Pain management: pain level controlled Vital Signs Assessment: post-procedure vital signs reviewed and stable Respiratory status: spontaneous breathing, nonlabored ventilation and respiratory function stable Cardiovascular status: blood pressure returned to baseline and stable Postop Assessment: no apparent nausea or vomiting Anesthetic complications: no   No notable events documented.                Lorijean Husser

## 2024-05-09 NOTE — Telephone Encounter (Signed)
 Pt would like a c/b to go over restrictions after procedure please advise

## 2024-05-10 ENCOUNTER — Other Ambulatory Visit (HOSPITAL_COMMUNITY): Payer: Self-pay | Admitting: Family Medicine

## 2024-05-16 ENCOUNTER — Ambulatory Visit: Attending: Cardiology

## 2024-05-16 DIAGNOSIS — I5022 Chronic systolic (congestive) heart failure: Secondary | ICD-10-CM | POA: Diagnosis not present

## 2024-05-16 DIAGNOSIS — D8685 Sarcoid myocarditis: Secondary | ICD-10-CM | POA: Diagnosis not present

## 2024-05-16 NOTE — Patient Instructions (Signed)
   After Your ICD (Implantable Cardiac Defibrillator)    Monitor your defibrillator site for redness, swelling, and drainage. Call the device clinic at [phone] if you experience these symptoms or fever/chills.  Your incision was closed with Steri-strips or staples:  You may shower after today's visit and wash your incision with soap and water . Avoid lotions, ointments, or perfumes over your incision until it is well-healed.  You may use a hot tub or a pool after your wound check appointment if the incision is completely closed.  Do not lift, push or pull greater than 10 pounds with the affected arm until 6 weeks after your procedure. There are no other restrictions in arm movement after your wound check appointment.  Your ICD is designed to protect you from life threatening heart rhythms. Because of this, you may receive a shock.   1 shock with no symptoms:  Call the office during business hours. 1 shock with symptoms (chest pain, chest pressure, dizziness, lightheadedness, shortness of breath, overall feeling unwell):  Call 911. If you experience 2 or more shocks in 24 hours:  Call 911. If you receive a shock, you should not drive.  [place] DMV - no driving for 6 months if you receive appropriate therapy from your ICD.   ICD Alerts:  Some alerts are vibratory and others beep. These are NOT emergencies. Please call our office to let us  know. If this occurs at night or on weekends, it can wait until the next business day. Send a remote transmission.  If your device is capable of reading fluid status (for heart failure), you will be offered monthly monitoring to review this with you.   Remote monitoring is used to monitor your ICD from home. This monitoring is scheduled every 91 days by our office. It allows us  to keep an eye on the functioning of your device to ensure it is working properly. You will routinely see your Electrophysiologist annually (more often if necessary).

## 2024-05-17 ENCOUNTER — Encounter: Payer: Self-pay | Admitting: Neurosurgery

## 2024-05-17 ENCOUNTER — Ambulatory Visit: Admitting: Neurosurgery

## 2024-05-17 VITALS — BP 160/74 | Ht 73.0 in | Wt 265.0 lb

## 2024-05-17 DIAGNOSIS — M48062 Spinal stenosis, lumbar region with neurogenic claudication: Secondary | ICD-10-CM

## 2024-05-17 DIAGNOSIS — G629 Polyneuropathy, unspecified: Secondary | ICD-10-CM

## 2024-05-17 DIAGNOSIS — M47816 Spondylosis without myelopathy or radiculopathy, lumbar region: Secondary | ICD-10-CM

## 2024-05-17 DIAGNOSIS — M4316 Spondylolisthesis, lumbar region: Secondary | ICD-10-CM

## 2024-05-17 DIAGNOSIS — E1140 Type 2 diabetes mellitus with diabetic neuropathy, unspecified: Secondary | ICD-10-CM

## 2024-05-17 LAB — CUP PACEART INCLINIC DEVICE CHECK
Battery Remaining Longevity: 81 mo
Brady Statistic RA Percent Paced: 48 %
Brady Statistic RV Percent Paced: 99.59 %
Date Time Interrogation Session: 20251202105500
HighPow Impedance: 50.625
Implantable Lead Connection Status: 753985
Implantable Lead Connection Status: 753985
Implantable Lead Connection Status: 753985
Implantable Lead Implant Date: 20251121
Implantable Lead Implant Date: 20251121
Implantable Lead Implant Date: 20251121
Implantable Lead Location: 753858
Implantable Lead Location: 753859
Implantable Lead Location: 753860
Implantable Lead Model: 7122
Implantable Pulse Generator Implant Date: 20251121
Lead Channel Impedance Value: 375 Ohm
Lead Channel Impedance Value: 412.5 Ohm
Lead Channel Impedance Value: 562.5 Ohm
Lead Channel Pacing Threshold Amplitude: 0.625 V
Lead Channel Pacing Threshold Amplitude: 0.75 V
Lead Channel Pacing Threshold Amplitude: 2 V
Lead Channel Pacing Threshold Amplitude: 2 V
Lead Channel Pacing Threshold Pulse Width: 0.5 ms
Lead Channel Pacing Threshold Pulse Width: 0.5 ms
Lead Channel Pacing Threshold Pulse Width: 0.8 ms
Lead Channel Pacing Threshold Pulse Width: 0.8 ms
Lead Channel Sensing Intrinsic Amplitude: 5 mV
Lead Channel Sensing Intrinsic Amplitude: 9.5 mV
Lead Channel Setting Pacing Amplitude: 1.625
Lead Channel Setting Pacing Amplitude: 1.75 V
Lead Channel Setting Pacing Amplitude: 2.5 V
Lead Channel Setting Pacing Pulse Width: 0.5 ms
Lead Channel Setting Pacing Pulse Width: 0.8 ms
Lead Channel Setting Sensing Sensitivity: 0.5 mV
Pulse Gen Serial Number: 211053127
Zone Setting Status: 755011

## 2024-05-17 NOTE — Progress Notes (Signed)
 Normal multiple chamber ICD wound check. Wound well healed. Presenting rhythm: AP/BiVP. Routine testing performed. Thresholds, sensing, and impedance for RA and RV leads consistent with implant measurements with 3.5V safety margin/auto capture until 3 month visit. No treated arrhythmias. Reviewed arm restrictions to continue for 6 weeks total post op. Reviewed shock plan.  Pt enrolled in remote follow-up.  LV threshold elevated but slightly down from implant value. Discussed with APP in clinic and will continue to monitor and reassess threshold at 91 day follow up. LV output auto capture turned off and fixed at 2.5 V @ 0.8 mS per APP

## 2024-05-19 ENCOUNTER — Encounter: Admitting: Physical Medicine & Rehabilitation

## 2024-05-21 NOTE — Telephone Encounter (Signed)
 Can go back to unlimited activity after the wound check if all is good.

## 2024-05-22 ENCOUNTER — Other Ambulatory Visit (HOSPITAL_COMMUNITY): Payer: Self-pay | Admitting: Internal Medicine

## 2024-05-24 NOTE — Telephone Encounter (Signed)
 Completed.

## 2024-05-25 ENCOUNTER — Ambulatory Visit: Payer: Self-pay | Admitting: Student in an Organized Health Care Education/Training Program

## 2024-05-31 ENCOUNTER — Other Ambulatory Visit: Payer: Self-pay | Admitting: Family Medicine

## 2024-06-12 ENCOUNTER — Other Ambulatory Visit (HOSPITAL_COMMUNITY): Payer: Self-pay | Admitting: Family Medicine

## 2024-06-16 ENCOUNTER — Ambulatory Visit: Attending: Student in an Organized Health Care Education/Training Program

## 2024-06-16 DIAGNOSIS — I428 Other cardiomyopathies: Secondary | ICD-10-CM

## 2024-06-17 LAB — CUP PACEART REMOTE DEVICE CHECK
Battery Remaining Longevity: 88 mo
Battery Remaining Percentage: 94 %
Battery Voltage: 3.04 V
Brady Statistic AP VP Percent: 44 %
Brady Statistic AP VS Percent: 1 %
Brady Statistic AS VP Percent: 56 %
Brady Statistic AS VS Percent: 1 %
Brady Statistic RA Percent Paced: 43 %
Date Time Interrogation Session: 20260102020056
HighPow Impedance: 59 Ohm
Implantable Lead Connection Status: 753985
Implantable Lead Connection Status: 753985
Implantable Lead Connection Status: 753985
Implantable Lead Implant Date: 20251121
Implantable Lead Implant Date: 20251121
Implantable Lead Implant Date: 20251121
Implantable Lead Location: 753858
Implantable Lead Location: 753859
Implantable Lead Location: 753860
Implantable Lead Model: 7122
Implantable Pulse Generator Implant Date: 20251121
Lead Channel Impedance Value: 380 Ohm
Lead Channel Impedance Value: 480 Ohm
Lead Channel Impedance Value: 980 Ohm
Lead Channel Pacing Threshold Amplitude: 0.625 V
Lead Channel Pacing Threshold Amplitude: 0.625 V
Lead Channel Pacing Threshold Amplitude: 2 V
Lead Channel Pacing Threshold Pulse Width: 0.5 ms
Lead Channel Pacing Threshold Pulse Width: 0.5 ms
Lead Channel Pacing Threshold Pulse Width: 0.8 ms
Lead Channel Sensing Intrinsic Amplitude: 12 mV
Lead Channel Sensing Intrinsic Amplitude: 5 mV
Lead Channel Setting Pacing Amplitude: 1.625
Lead Channel Setting Pacing Amplitude: 1.625
Lead Channel Setting Pacing Amplitude: 2.5 V
Lead Channel Setting Pacing Pulse Width: 0.5 ms
Lead Channel Setting Pacing Pulse Width: 0.8 ms
Lead Channel Setting Sensing Sensitivity: 0.5 mV
Pulse Gen Serial Number: 211053127
Zone Setting Status: 755011

## 2024-06-18 ENCOUNTER — Ambulatory Visit: Payer: Self-pay | Admitting: Student in an Organized Health Care Education/Training Program

## 2024-06-20 ENCOUNTER — Ambulatory Visit: Admitting: Student in an Organized Health Care Education/Training Program

## 2024-06-20 ENCOUNTER — Telehealth: Payer: Self-pay

## 2024-06-20 ENCOUNTER — Ambulatory Visit
Admission: RE | Admit: 2024-06-20 | Discharge: 2024-06-20 | Disposition: A | Discharge status: Home / Self Care | Source: Ambulatory Visit | Attending: Student in an Organized Health Care Education/Training Program | Admitting: Student in an Organized Health Care Education/Training Program

## 2024-06-20 ENCOUNTER — Encounter: Payer: Self-pay | Admitting: Student in an Organized Health Care Education/Training Program

## 2024-06-20 VITALS — BP 162/80 | HR 63 | Temp 97.3°F | Resp 16 | Ht 73.0 in | Wt 270.0 lb

## 2024-06-20 DIAGNOSIS — G894 Chronic pain syndrome: Secondary | ICD-10-CM

## 2024-06-20 DIAGNOSIS — M47816 Spondylosis without myelopathy or radiculopathy, lumbar region: Secondary | ICD-10-CM | POA: Insufficient documentation

## 2024-06-20 DIAGNOSIS — Z7984 Long term (current) use of oral hypoglycemic drugs: Secondary | ICD-10-CM | POA: Diagnosis not present

## 2024-06-20 DIAGNOSIS — E1140 Type 2 diabetes mellitus with diabetic neuropathy, unspecified: Secondary | ICD-10-CM

## 2024-06-20 NOTE — Patient Instructions (Addendum)
 Preparing for Procedure with Sedation Instructions: Oral Intake: Do not eat or drink anything for at least 8 hours prior to your procedure. Transportation: Public transportation is not allowed. Bring an adult driver. The driver must be physically present in our waiting room before any procedure can be started. Physical Assistance: Bring an adult capable of physically assisting you, in the event you need help. Blood Pressure Medicine: Take your blood pressure medicine with a sip of water the morning of the procedure. Insulin : Take only  of your normal insulin  dose. Preventing infections: Shower with an antibacterial soap the morning of your procedure. Build-up your immune system: Take 1000 mg of Vitamin C with every meal (3 times a day) the day prior to your procedure. Pregnancy: If you are pregnant, call and cancel the procedure. Sickness: If you have a cold, fever, or any active infections, call and cancel the procedure. Arrival: You must be in the facility at least 30 minutes prior to your scheduled procedure. Children: Do not bring children with you. Dress appropriately: Bring dark clothing that you would not mind if they get stained. Valuables: Do not bring any jewelry or valuables. Procedure appointments are reserved for interventional treatments only. No Prescription Refills. No medication changes will be discussed during procedure appointments. No disability issues will be discussed. Brochure for psych eval, fax results Schedule for SCS trial  We need documented cardiac clearance to stop Xarelto  3 days prior and to remain off 7 days during trial

## 2024-06-20 NOTE — Progress Notes (Signed)
 Safety precautions to be maintained throughout the outpatient stay will include: orient to surroundings, keep bed in low position, maintain call bell within reach at all times, provide assistance with transfer out of bed and ambulation.

## 2024-06-20 NOTE — Telephone Encounter (Signed)
 Dr Marcelino would like clearance to stop Xarelto  3 days prior to Spinal  Cord Stimulator Trial and 7 days during the trial for a total of 10 days.  Thank you for your time.

## 2024-06-20 NOTE — Telephone Encounter (Signed)
 Dr Marcelino would like clearance to stop Xarelto  for 3 days prior to Spinal Cord Stimulator Trial;  and 7 days during the trial for a total of 10 days.  Thank you for your time.

## 2024-06-20 NOTE — Progress Notes (Signed)
 PROVIDER NOTE: Interpretation of information contained herein should be left to medically-trained personnel. Specific patient instructions are provided elsewhere under Patient Instructions section of medical record. This document was created in part using AI and STT-dictation technology, any transcriptional errors that may result from this process are unintentional.  Patient: Christopher Green  Service: E/M   PCP: Chandra Toribio POUR, MD  DOB: [birth year]  DOS: 06/20/2024  Provider: Wallie Sherry, MD  MRN: [id number]  Delivery: Face-to-face  Specialty: Interventional Pain Management  Type: Established Patient  Setting: Ambulatory outpatient facility  Specialty designation: 09  Referring Prov.: Ulis Bottcher, PA-C  Location: Outpatient office facility       History of present illness (HPI) [name], a 54 y.o. year old male, is here today because of his Chronic painful diabetic neuropathy (HCC) [E11.40]. Mr. Buenger primary complain today is Back Pain (Lumbar bilateral ) and Foot Pain (Bilateral PN)  Pertinent problems: Mr. Spivak does not have any pertinent problems on file.  Pain Assessment: Severity of Chronic pain is reported as a 6 /10. Location: Back Lower, Left, Right/back pain into both legs to the feet. Onset: More than a month ago. Quality: Discomfort, Aching, Burning, Constant, Sharp, Stabbing, Numbness. Timing: Constant. Modifying factor(s): nothing currently, relies on pain medication and when he runs out of that he uses BC powders, muscle relaxers and lyrica . Vitals:  height is 6' 1 (1.854 m) and weight is 270 lb (122.5 kg). His temporal temperature is 97.3 F (36.3 C) (abnormal). His blood pressure is 162/80 (abnormal) and his pulse is 63. His respiration is 16 and oxygen saturation is 98%.  BMI: Estimated body mass index is 35.62 kg/m as calculated from the following:   Height as of this encounter: 6' 1 (1.854 m).   Weight as of this encounter: 270 lb (122.5 kg).  Last encounter:  Visit date not found. Last procedure: 06/28/2023.  Reason for encounter:  Patient presents today for worsening low back pain and bilateral foot pain related to neuropathy. Recent lumbar MRI as below which shows disc herniations, facet arthropathy resulting in right L4 nerve root displacement. He also has moderate spinal canal stenosis along with severe right and moderate left foraminal stenosis at L4-L5.  He has type 2 diabetes.  He has a significant cardiac history with a defibrillator in place.  He has done physical therapy in the past.  He saw neurosurgery and surgery was not indicated at this time the patient is interested in a less invasive option to help address both his back and leg pain that is durable and sustainable in the context of his type 2 diabetes.  He is being referred here to consider spinal cord stimulation for lumbar spondylolisthesis, lumbar radicular pain and also chronic diabetic peripheral neuropathy.   No results found for: D8THCCBX No results found for: D9THCCBX  ROS  Constitutional: Denies any fever or chills Gastrointestinal: No reported hemesis, hematochezia, vomiting, or acute GI distress Musculoskeletal: Low back pain Neurological: Numbness and tingling of bilateral feet  Medication Review  albuterol , amiodarone , amitriptyline , atorvastatin , cetirizine , empagliflozin , icosapent  Ethyl, metFORMIN , metoprolol  succinate, oxyCODONE -acetaminophen , potassium chloride  SA, pregabalin , rivaroxaban , sacubitril-valsartan , sildenafil , spironolactone , testosterone  cypionate, tiZANidine , and torsemide   History Review  Allergy: Mr. Nakamura has no known allergies. Drug: Mr. Slocumb  reports no history of drug use. Alcohol:  reports current alcohol use. Tobacco:  reports that he has been smoking cigarettes. He has been exposed to tobacco smoke. He has never used smokeless tobacco. Social: Mr. Bergeson  reports  that he has been smoking cigarettes. He has been exposed to tobacco smoke.  He has never used smokeless tobacco. He reports current alcohol use. He reports that he does not use drugs. Medical:  has a past medical history of Alcohol abuse, Arthritis, Atrial flutter (HCC), Cardiac sarcoidosis, Chronic pain, Diabetes mellitus without complication (HCC), DVT (deep venous thrombosis) (HCC) (05/26/2022), Dysrhythmia, Hypercoagulable state, Hyperlipidemia, Hypertension, Obesity, OSA on CPAP, PE (pulmonary thromboembolism) (HCC) (05/25/2022), Peripheral polyneuropathy, Presence of permanent cardiac pacemaker, Tobacco abuse, and Venous insufficiency. Surgical: Mr. Vollmer  has a past surgical history that includes Cardiac catheterization (03/25/2018); PACEMAKER IMPLANT (N/A, 08/17/2022); Carpal tunnel release (Left); Video bronchoscopy with endobronchial ultrasound (N/A, 08/16/2023); Bronchial washings (08/16/2023); Fine needle aspiration (08/16/2023); TRANSESOPHAGEAL ECHOCARDIOGRAM (N/A, 09/22/2023); CARDIOVERSION (N/A, 09/22/2023); LEAD EXTRACTION (N/A, 05/05/2024); PPM GENERATOR REMOVAL (N/A, 05/05/2024); and ICD IMPLANT (N/A, 05/05/2024). Family: family history includes Heart attack in his father.  Laboratory Chemistry Profile   Renal Lab Results  Component Value Date   BUN 12 04/12/2024   CREATININE 0.89 04/12/2024   BCR 19 02/09/2023   GFRNONAA >60 04/12/2024    Hepatic Lab Results  Component Value Date   AST 27 10/25/2023   ALT 31 10/25/2023   ALBUMIN  3.5 10/25/2023   ALKPHOS 46 10/25/2023    Electrolytes Lab Results  Component Value Date   NA 138 04/12/2024   K 4.4 04/12/2024   CL 97 (L) 04/12/2024   CALCIUM  9.2 04/12/2024   MG 2.0 07/29/2022    Bone Lab Results  Component Value Date   TESTOSTERONE  808 05/10/2023    Inflammation (CRP: Acute Phase) (ESR: Chronic Phase) No results found for: CRP, ESRSEDRATE, LATICACIDVEN       Note: Above Lab results reviewed.  Recent Imaging Review  XAM: MRI LUMBAR SPINE WITHOUT CONTRAST   TECHNIQUE: Multiplanar,  multisequence MR imaging of the lumbar spine was performed. No intravenous contrast was administered.   COMPARISON:  April 12, 2019   FINDINGS: Bone marrow: No significant abnormality   Conus and cauda equina: No significant abnormality   Paraspinal tissues: No significant abnormality   L1-L2: The disc is normal. There is mild facet arthropathy. No spinal stenosis or foraminal stenosis   L2-L3: There is mild degenerative disc disease with a mild disc bulge. There is facet arthropathy with facet enlargement and moderate spinal stenosis due to a small spinal canal.   L3-L4: There is mild degenerative disc disease with a mild disc bulge. There is mild facet arthropathy. There is no spinal stenosis or foraminal stenosis.   L4-L5: There is mild degenerative disc disease with a mild disc bulge. There is severe bilateral facet arthropathy with a slight degenerative spondylolisthesis. There is moderate spinal stenosis and mild bilateral neural foraminal stenosis   L5-S1: There is a mild disc bulge. There is mild facet arthropathy. No spinal stenosis or foraminal stenosis   IMPRESSION: No compression fracture or acute abnormality.   Congenitally small spinal canal.   There is severe facet arthropathy at L4-5 with a slight degenerative spondylolisthesis and moderate spinal stenosis.   Moderate spinal stenosis at L2-3 due to disc bulge, facet arthropathy and small spinal canal. Note: Reviewed        Physical Exam  Vitals: BP (!) 162/80 (BP Location: Left Arm, Patient Position: Sitting, Cuff Size: Large)   Pulse 63   Temp (!) 97.3 F (36.3 C) (Temporal)   Resp 16   Ht 6' 1 (1.854 m)   Wt 270 lb (122.5 kg)   SpO2 98%  BMI 35.62 kg/m  BMI: Estimated body mass index is 35.62 kg/m as calculated from the following:   Height as of this encounter: 6' 1 (1.854 m).   Weight as of this encounter: 270 lb (122.5 kg). Ideal: Ideal body weight: 79.9 kg (176 lb 2.4 oz) Adjusted  ideal body weight: 96.9 kg (213 lb 11 oz) General appearance: Well nourished, well developed, and well hydrated. In no apparent acute distress Mental status: Alert, oriented x 3 (person, place, & time)       Respiratory: No evidence of acute respiratory distress Eyes: PERLA   Chronic low back pain, worse with lumbar extension  Bilateral feet paresthesias  Assessment   Diagnosis  1. Chronic painful diabetic neuropathy (HCC)   2. Lumbar spondylosis   3. Lumbar facet arthropathy   4. Chronic pain syndrome      Updated Problems: No problems updated.  Plan of Care  The patient presents with chronic, intractable pain involving both mechanical low back pain and neurogenic components radiating into the lower extremities. Given the patients history of chronic painful diabetic neuropathic pain of lower extremity and lumbar radiculopathy due to spondylolisthesis and congenital spinal stenosis, and limited response to conservative and interventional therapies, a spinal cord stimulator (SCS) trial is being considered. While SCS is typically more effective for neuropathic and appendicular pain, we discussed that some patients may also experience relief in axial low back and hip pain. The potential benefits and risks of spinal cord stimulation were thoroughly reviewed.  The proposed plan includes a percutaneous spinal cord stimulator trial. The patient was informed that this will involve temporary placement of epidural leads connected to an external pulse generator, which will be used over a 7-day trial period. We discussed the possibility of a mid-trial in-office visit to adjust settings and optimize programming in order to give the patient the best chance of success. The patient will receive daily support from the device representative throughout the trial.  We reviewed the benefits of SCS, which include potential substantial pain relief, reduction in the use of oral pain medications including  opioids, and long-term programmable therapy that can reduce reliance on repeated injections and other pain interventions. Risks were also discussed in detail and include potential surgical complications such as infection, bleeding, CSF leak, lead migration or fracture, hardware malfunction, and the possibility of either no pain relief or worsening of symptoms. The patient was advised that spinal cord stimulation is not a guaranteed solution or a magic bullet, but rather a potentially valuable therapy in appropriately selected cases.  I was able to evaluate his interlaminar windows under live fluoroscopy and they appear patent for percutaneous access  As part of standard protocol, the patient will require a comprehensive psychosocial and behavioral evaluation prior to the trial. A referral was placed to [facility], and the patient was instructed to have the results faxed to our clinic prior to scheduling the procedure. We had a thorough and detailed discussion reviewing the rationale, alternatives, risks, and expected outcomes. The patient stated that all questions were answered to their satisfaction, demonstrated appropriate understanding, and expressed readiness to proceed. There were no barriers to understanding the treatment plan, and the explanation was well received. The patient is eager to move forward with the spinal cord stimulator trial once the necessary evaluation is complete.   We will also need to get cardiac clearance for him to stop his Xarelto  3 days prior to SCS trial and also to remain off of it for 7 days during  his trial.  This will be documented clearance.  Orders:  Orders Placed This Encounter  Procedures   [facility] TRIAL    Contact medical implant company representative to notify them of the scheduled case and to make sure they will be available to provide required equipment.    Standing Status:   Future    Expected Date:   07/12/2024    Expiration Date:   06/20/2025    Scheduling  Instructions:     Side: Bilateral     Level: Lumbar     Device: Medtronic     Sedation: With sedation     Timeframe: As soon as pre-approved    Where will this procedure be performed?:   [facility]   DG PAIN [facility] C-ARM 1-60 MIN NO REPORT    Intraoperative interpretation by procedural physician at [facility].    Standing Status:   Standing    Number of Occurrences:   1    Reason for exam::   Assistance in needle guidance and placement for procedures requiring needle placement in or near specific anatomical locations not easily accessible without such assistance.   Ambulatory referral to Psychology    Referral Priority:   Routine    Referral Type:   Psychiatric    Referral Reason:   Specialty Services Required    Requested Specialty:   Psychology    Number of Visits Requested:   1     Left Cervical ESI 06/28/23   Return in about 4 weeks (around 07/18/2024) for Medtronic SCS trial, ECT (stop Xarelto  3 days prior and remain off for 7 days during trial).    Recent Visits No visits were found meeting these conditions. Showing recent visits within past 90 days and meeting all other requirements Today's Visits Date Type Provider Dept  06/20/24 Office Visit Marcelino Nurse, MD [facility]  Showing today's visits and meeting all other requirements Future Appointments No visits were found meeting these conditions. Showing future appointments within next 90 days and meeting all other requirements  I discussed the assessment and treatment plan with the patient. The patient was provided an opportunity to ask questions and all were answered. The patient agreed with the plan and demonstrated an understanding of the instructions.  Patient advised to call back or seek an in-person evaluation if the symptoms or condition worsens.  I personally spent a total of 30 minutes in the care of the patient today including preparing to see the patient, getting/reviewing  separately obtained history, performing a medically appropriate exam/evaluation, counseling and educating, placing orders, and documenting clinical information in the EHR.   Note by: Nurse Marcelino, MD (TTS and AI technology used. I apologize for any typographical errors that were not detected and corrected.) Date: 06/20/2024; Time: 10:30 AM

## 2024-06-21 ENCOUNTER — Telehealth: Payer: Self-pay

## 2024-06-21 ENCOUNTER — Telehealth: Payer: Self-pay | Admitting: Student in an Organized Health Care Education/Training Program

## 2024-06-21 NOTE — Telephone Encounter (Signed)
 Pt would like a call back to discuss what he is allowed to as well as restrictions please advise

## 2024-06-21 NOTE — Telephone Encounter (Signed)
 His insurance will not cover [facility]. Do you have a suggestion of where else he can be sent for the scs pysc eval?

## 2024-06-21 NOTE — Telephone Encounter (Signed)
 Redell with [employer] advised of Pt's questions.  He states he will give him a call this afternoon.  Pt advised via MyChart and direct number given to device clinic if any further questions.

## 2024-06-21 NOTE — Telephone Encounter (Signed)
"   if that is the standard for these procedures that is okay.  He is on it for lupus anticoagulant disorder so it should be restarted as soon as it can be done safely after the procedure is over.   "

## 2024-06-21 NOTE — Progress Notes (Signed)
 Remote ICD Transmission

## 2024-06-21 NOTE — Telephone Encounter (Signed)
 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 Aware forwarding to device clinic who will follow up with him to discuss.

## 2024-06-22 NOTE — Telephone Encounter (Signed)
 Clearance received to stop Xarelto  for 3 days.  Patient notified. Merlynn notified.

## 2024-06-26 ENCOUNTER — Telehealth: Payer: Self-pay | Admitting: *Deleted

## 2024-06-26 ENCOUNTER — Ambulatory Visit: Admitting: Family Medicine

## 2024-06-26 ENCOUNTER — Encounter: Payer: Self-pay | Admitting: Family Medicine

## 2024-06-26 VITALS — BP 137/71 | HR 81 | Ht 73.0 in | Wt 280.4 lb

## 2024-06-26 DIAGNOSIS — Z7984 Long term (current) use of oral hypoglycemic drugs: Secondary | ICD-10-CM | POA: Diagnosis not present

## 2024-06-26 DIAGNOSIS — L03031 Cellulitis of right toe: Secondary | ICD-10-CM | POA: Diagnosis not present

## 2024-06-26 DIAGNOSIS — G8929 Other chronic pain: Secondary | ICD-10-CM | POA: Diagnosis not present

## 2024-06-26 DIAGNOSIS — E1169 Type 2 diabetes mellitus with other specified complication: Secondary | ICD-10-CM | POA: Diagnosis not present

## 2024-06-26 DIAGNOSIS — M5441 Lumbago with sciatica, right side: Secondary | ICD-10-CM

## 2024-06-26 DIAGNOSIS — G629 Polyneuropathy, unspecified: Secondary | ICD-10-CM | POA: Diagnosis not present

## 2024-06-26 DIAGNOSIS — M5442 Lumbago with sciatica, left side: Secondary | ICD-10-CM

## 2024-06-26 DIAGNOSIS — M545 Low back pain, unspecified: Secondary | ICD-10-CM

## 2024-06-26 LAB — POCT URINALYSIS DIP (CLINITEK)
Bilirubin, UA: NEGATIVE
Blood, UA: NEGATIVE
Glucose, UA: >=1000 mg/dL — AB
Ketones, POC UA: NEGATIVE mg/dL
Leukocytes, UA: NEGATIVE
Nitrite, UA: NEGATIVE
POC PROTEIN,UA: NEGATIVE
Spec Grav, UA: 1.025
Urobilinogen, UA: 0.2 U/dL
pH, UA: 5.5

## 2024-06-26 MED ORDER — OXYCODONE-ACETAMINOPHEN 10-325 MG PO TABS: 1.0000 | ORAL_TABLET | ORAL | 0 refills | Status: AC | PRN

## 2024-06-26 NOTE — Telephone Encounter (Signed)
 Christopher Green with [employer] met with Pt at his home to evaluate for any interference with ICD with welding.  Per Christopher Green, no issues.

## 2024-06-26 NOTE — Assessment & Plan Note (Signed)
 No recent changes in diabetes medications. Labs for blood sugar and cholesterol pending. - Ordered labs for blood sugar and cholesterol. - Will review lab results and adjust diabetes management if necessary.

## 2024-06-26 NOTE — Assessment & Plan Note (Signed)
 Significant symptoms. Scheduled procedure with foot doctor for neuropathy relief. - Proceed with scheduled appointment with foot doctor for neuropathy management.

## 2024-06-26 NOTE — Telephone Encounter (Signed)
 Called pt to schedule his appt in 3 months and let him know that he did not need to come for the appt on 07/06/24 due to being seen today. I have scheduled his next appt for 10/04/24 at 9:10am and he is aware.

## 2024-06-26 NOTE — Progress Notes (Signed)
 "  Established Patient Office Visit  Subjective   Patient ID: Christopher Green, male    DOB: February 04, 1971  Age: 54 y.o. MRN: [id number]  Chief Complaint  Patient presents with   Back Pain     History of Present Illness   Christopher Green is a 54 year old male with chronic back pain who presents with worsening pain and medication management issues.  He reports worsening chronic back pain now severe enough that he cannot stand. Muscle relaxers and pregabalin  have not controlled the pain. He is out of his prescribed oxycodone , last filled on December 17, which relieved his pain for only a few hours, and he sometimes took two tablets for effect. He is concerned about a possible kidney infection but denies urinary symptoms and states the pain feels like his usual back pain but more intense. He has diabetes without recent medication changes. He has not used alcohol since April and denies recreational drug use.          The 10-year ASCVD risk score (Arnett DK, et al., 2019) is: 24.9%  Health Maintenance Due  Topic Date Due   COVID-19 Vaccine (1) Never done   OPHTHALMOLOGY EXAM  Never done   Hepatitis C Screening  Never done   DTaP/Tdap/Td (1 - Tdap) Never done   Hepatitis B Vaccines 19-59 Average Risk (1 of 3 - 19+ 3-dose series) Never done   Lung Cancer Screening  05/30/2024      Objective:     BP 137/71   Pulse 81   Ht 6' 1 (1.854 m)   Wt 280 lb 6.4 oz (127.2 kg)   SpO2 98%   BMI 36.99 kg/m    Physical Exam     Gen: alert, oriented.  Appears in mod amount of pain.  Unable to find a comfortable position to stand/sit for very long Pulm: no respiratory distress Psych: pleasant affect       Results for orders placed or performed in visit on 06/26/24  POCT URINALYSIS DIP (CLINITEK)  Result Value Ref Range   Color, UA yellow yellow   Clarity, UA cloudy (A) clear   Glucose, UA >=1,000 (A) negative mg/dL   Bilirubin, UA negative negative   Ketones, POC UA negative negative  mg/dL   Spec Grav, UA 8.974 8.989 - 1.025   Blood, UA negative negative   pH, UA 5.5 5.0 - 8.0   POC PROTEIN,UA negative negative, trace   Urobilinogen, UA 0.2 0.2 or 1.0 E.U./dL   Nitrite, UA Negative Negative   Leukocytes, UA Negative Negative        Assessment & Plan:   Acute low back pain, unspecified back pain laterality, unspecified whether sciatica present -     POCT URINALYSIS DIP (CLINITEK)  Type 2 diabetes mellitus with other specified complication, without long-term current use of insulin  (HCC) Assessment & Plan: No recent changes in diabetes medications. Labs for blood sugar and cholesterol pending. - Ordered labs for blood sugar and cholesterol. - Will review lab results and adjust diabetes management if necessary.  Orders: -     Microalbumin / creatinine urine ratio -     Lipid panel -     Hemoglobin A1c -     Comprehensive metabolic panel with GFR  Cellulitis of great toe of right foot -     CBC with Differential/Platelet  Peripheral polyneuropathy Assessment & Plan: Significant symptoms. Scheduled procedure with foot doctor for neuropathy relief. - Proceed with scheduled appointment with foot  doctor for neuropathy management.    Chronic bilateral low back pain with bilateral sciatica Assessment & Plan: Severe exacerbation managed with oxycodone , muscle relaxers, and pregabalin . Considering spinal cord stimulator. Discussed Jurnavax, concerns about interactions and insurance. Undergoing psychological evaluation for stimulator approval. - Prescribed oxycodone  with 10 additional tablets. - Advised discussing Jurnavax with physical medicine and rehab doctor. - Ordered urine test to rule out kidney infection. - Continue current pain management regimen.  Type 2 diabetes mellitus with other specified complication   Other orders -     oxyCODONE -Acetaminophen ; Take 1 tablet by mouth every 4 (four) hours as needed for pain.  Dispense: 160 tablet; Refill:  0       Return in about 3 months (around 09/24/2024) for DM.    [name], MD  "

## 2024-06-26 NOTE — Patient Instructions (Addendum)
" °  VISIT SUMMARY: During your visit, we addressed your worsening chronic back pain and discussed your medication management. We also reviewed your diabetes management and scheduled a procedure for your diabetic peripheral neuropathy.  YOUR PLAN: CHRONIC PAIN SYNDROME: You have severe chronic back pain that has worsened recently. -We have prescribed oxycodone  with 10 additional tablets. -Continue taking your muscle relaxers and pregabalin  as directed. -We discussed the possibility of using Jurnavax and advised you to talk to your physical medicine and rehab doctor about it.   TYPE 2 DIABETES MELLITUS WITH OTHER SPECIFIED COMPLICATION: Your diabetes management was reviewed, and no recent changes have been made to your medications. -We have ordered labs for blood sugar and cholesterol. -We will review the lab results and adjust your diabetes management if necessary.  DIABETIC PERIPHERAL NEUROPATHY: You have significant symptoms of diabetic peripheral neuropathy. -Proceed with your scheduled appointment with the foot doctor for neuropathy management.   "

## 2024-06-26 NOTE — Assessment & Plan Note (Signed)
 Severe exacerbation managed with oxycodone , muscle relaxers, and pregabalin . Considering spinal cord stimulator. Discussed Jurnavax, concerns about interactions and insurance. Undergoing psychological evaluation for stimulator approval. - Prescribed oxycodone  with 10 additional tablets. - Advised discussing Jurnavax with physical medicine and rehab doctor. - Ordered urine test to rule out kidney infection. - Continue current pain management regimen.  Type 2 diabetes mellitus with other specified complication

## 2024-06-27 LAB — CBC WITH DIFFERENTIAL/PLATELET
Basophils Absolute: 0.1 x10E3/uL (ref 0.0–0.2)
Basos: 0 %
EOS (ABSOLUTE): 0.2 x10E3/uL (ref 0.0–0.4)
Eos: 2 %
Hematocrit: 43.6 % (ref 37.5–51.0)
Hemoglobin: 14.1 g/dL (ref 13.0–17.7)
Immature Grans (Abs): 0 x10E3/uL (ref 0.0–0.1)
Immature Granulocytes: 0 %
Lymphocytes Absolute: 2 x10E3/uL (ref 0.7–3.1)
Lymphs: 17 %
MCH: 29.1 pg (ref 26.6–33.0)
MCHC: 32.3 g/dL (ref 31.5–35.7)
MCV: 90 fL (ref 79–97)
Monocytes Absolute: 1.3 x10E3/uL — ABNORMAL HIGH (ref 0.1–0.9)
Monocytes: 11 %
Neutrophils Absolute: 8 x10E3/uL — ABNORMAL HIGH (ref 1.4–7.0)
Neutrophils: 70 %
Platelets: 241 x10E3/uL (ref 150–450)
RBC: 4.84 x10E6/uL (ref 4.14–5.80)
RDW: 13 % (ref 11.6–15.4)
WBC: 11.6 x10E3/uL — ABNORMAL HIGH (ref 3.4–10.8)

## 2024-06-27 LAB — COMPREHENSIVE METABOLIC PANEL WITH GFR
ALT: 7 IU/L (ref 0–44)
AST: 11 IU/L (ref 0–40)
Albumin: 4.3 g/dL (ref 3.8–4.9)
Alkaline Phosphatase: 64 IU/L (ref 47–123)
BUN/Creatinine Ratio: 14 (ref 9–20)
BUN: 14 mg/dL (ref 6–24)
Bilirubin Total: 0.6 mg/dL (ref 0.0–1.2)
CO2: 23 mmol/L (ref 20–29)
Calcium: 9.8 mg/dL (ref 8.7–10.2)
Chloride: 96 mmol/L (ref 96–106)
Creatinine, Ser: 0.97 mg/dL (ref 0.76–1.27)
Globulin, Total: 2.9 g/dL (ref 1.5–4.5)
Glucose: 111 mg/dL — ABNORMAL HIGH (ref 70–99)
Potassium: 4.9 mmol/L (ref 3.5–5.2)
Sodium: 137 mmol/L (ref 134–144)
Total Protein: 7.2 g/dL (ref 6.0–8.5)
eGFR: 92 mL/min/1.73

## 2024-06-27 LAB — LIPID PANEL
Chol/HDL Ratio: 4.1 ratio (ref 0.0–5.0)
Cholesterol, Total: 157 mg/dL (ref 100–199)
HDL: 38 mg/dL — ABNORMAL LOW
LDL Chol Calc (NIH): 96 mg/dL (ref 0–99)
Triglycerides: 130 mg/dL (ref 0–149)
VLDL Cholesterol Cal: 23 mg/dL (ref 5–40)

## 2024-06-27 LAB — HEMOGLOBIN A1C
Est. average glucose Bld gHb Est-mCnc: 137 mg/dL
Hgb A1c MFr Bld: 6.4 % — ABNORMAL HIGH (ref 4.8–5.6)

## 2024-06-27 LAB — MICROALBUMIN / CREATININE URINE RATIO
Creatinine, Urine: 109.4 mg/dL
Microalb/Creat Ratio: 6 mg/g{creat} (ref 0–29)
Microalbumin, Urine: 6.5 ug/mL

## 2024-06-29 ENCOUNTER — Other Ambulatory Visit

## 2024-06-30 ENCOUNTER — Other Ambulatory Visit (HOSPITAL_COMMUNITY): Payer: Self-pay | Admitting: Family Medicine

## 2024-06-30 ENCOUNTER — Encounter: Payer: Self-pay | Admitting: Physical Medicine & Rehabilitation

## 2024-06-30 ENCOUNTER — Encounter: Attending: Physical Medicine & Rehabilitation | Admitting: Physical Medicine & Rehabilitation

## 2024-06-30 ENCOUNTER — Ambulatory Visit: Payer: Self-pay | Admitting: Family Medicine

## 2024-06-30 VITALS — BP 175/89 | HR 75 | Ht 73.0 in | Wt 277.0 lb

## 2024-06-30 DIAGNOSIS — G894 Chronic pain syndrome: Secondary | ICD-10-CM | POA: Insufficient documentation

## 2024-06-30 DIAGNOSIS — G629 Polyneuropathy, unspecified: Secondary | ICD-10-CM | POA: Insufficient documentation

## 2024-06-30 MED ORDER — CAPSAICIN-CLEANSING GEL 8 % EX KIT
4.0000 | PACK | Freq: Once | CUTANEOUS | Status: AC
  Administered 2024-06-30: 4 via TOPICAL

## 2024-06-30 NOTE — Progress Notes (Signed)
 "  Subjective:    Patient ID: Christopher Green, male    DOB: 03-16-1971, 54 y.o.   MRN: [id number]  HPI Discussed the use of AI scribe software for clinical note transcription with the patient, who gave verbal consent to proceed.   Did not have EPIC access during initial visit due to IT issue.   Presents for evaluation neuropathic pain in both feet. Reports burning pain, which is worse at night. Describes the pain as severe. Denies tingling. States feet also feel numb. The onset of pain is intermittent and has been present for years, but the current episode is persistent. Reports a history of a torn tendon in the left ankle, for which he wore a boot. Reports a 50+ pound weight loss, from 315 lbs to 269 lbs, through diet modification. States there is no clear trigger for pain flare-ups.  Past Medical History - Neuropathy: Diagnosed by trauma care and cardiology. - Diabetes: Unsure of diagnosis but is on metformin . Last A1C was 6.5-6.6. Reports a past A1C of 12-13 during an episode of [facility] spotted fever. - Spinal conditions: Spinal stenosis from L1-S1, requiring fusion. Neck issues also noted. - Cardiac: History of pacemaker, scheduled for replacement on 05/05/2024. - Other: History of [place] spotted fever.  Medications - Reports taking pregabalin  (Lyrica ) 300 mg daily, but admits to trying it more frequently for pain relief on occasion. Denies significant sleepiness with Lyrica . - Oxycodone : Prescribed by PCP, Dr. Flynn, for spine-related pain. Takes as needed. - Metformin : Twice dail - He reports a history of taking gabapentin  and duloxetine  (Cymbalta ) without benefit for his nerve pain. Gabapentin  caused excessive sleepiness. - Has not tried nortriptyline or amitriptyline . - Has tried topical creams (e.g., Neriva) without significant relief. - Has a TENS unit, previously used for back pain, but not used on feet.  Interval History 06/30/24 Patient is here for treatment  with Qutenza  to his bilateral feet. The patient reports ongoing severe, burning pain in the feet. He is unsure if the previously prescribed amitriptyline  or the adjusted Lyrica  dose provided any benefit. He continues to take oxycodone  for pain management ordered by different provider.   Pain Inventory Average Pain 8 Pain Right Now 4 My pain is constant, sharp, burning, dull, stabbing, tingling, and aching  In the last 24 hours, has pain interfered with the following? General activity 9 Relation with others 10 Enjoyment of life 9 What TIME of day is your pain at its worst? night Sleep (in general) Poor  Pain is worse with: inactivity Pain improves with: medication Relief from Meds: 1  walk without assistance use a cane how many minutes can you walk? unknown ability to climb steps?  yes do you drive?  yes Do you have any goals in this area?  yes  not employed: date last employed applied  Do you have any goals in this area?  yes  weakness numbness tremor tingling trouble walking dizziness  Any changes since last visit?  yes x-rays [facility]  Any changes since last visit?  no    Family History  Problem Relation Age of Onset   Heart attack Father    Social History   Socioeconomic History   Marital status: Married    Spouse name: Not on file   Number of children: Not on file   Years of education: Not on file   Highest education level: 12th grade  Occupational History   Not on file  Tobacco Use   Smoking  status: Every Day    Types: Cigarettes    Passive exposure: Current   Smokeless tobacco: Never   Tobacco comments:    1ppd 08/26/23  Vaping Use   Vaping status: Never Used  Substance and Sexual Activity   Alcohol use: Yes    Comment: daily   Drug use: Never   Sexual activity: Not on file  Other Topics Concern   Not on file  Social History Narrative   Not on file   Social Drivers of Health   Tobacco Use: High Risk (06/26/2024)   Patient  History    Smoking Tobacco Use: Every Day    Smokeless Tobacco Use: Never    Passive Exposure: Current  Financial Resource Strain: High Risk (04/05/2024)   Overall Financial Resource Strain (CARDIA)    Difficulty of Paying Living Expenses: Very hard  Food Insecurity: Food Insecurity Present (04/05/2024)   Epic    Worried About Programme Researcher, Broadcasting/film/video in the Last Year: Often true    Ran Out of Food in the Last Year: Sometimes true  Transportation Needs: Unmet Transportation Needs (04/05/2024)   Epic    Lack of Transportation (Medical): Yes    Lack of Transportation (Non-Medical): Yes  Physical Activity: Unknown (04/05/2024)   Exercise Vital Sign    Days of Exercise per Week: Patient declined    Minutes of Exercise per Session: Not on file  Stress: Stress Concern Present (04/05/2024)   [employer] of Occupational Health - Occupational Stress Questionnaire    Feeling of Stress: Rather much  Social Connections: Socially Integrated (04/05/2024)   Social Connection and Isolation Panel    Frequency of Communication with Friends and Family: More than three times a week    Frequency of Social Gatherings with Friends and Family: More than three times a week    Attends Religious Services: More than 4 times per year    Active Member of Clubs or Organizations: Yes    Attends Banker Meetings: More than 4 times per year    Marital Status: Married  Depression (PHQ2-9): Low Risk (06/20/2024)   Depression (PHQ2-9)    PHQ-2 Score: 4  Recent Concern: Depression (PHQ2-9) - High Risk (04/05/2024)   Depression (PHQ2-9)    PHQ-2 Score: 14  Alcohol Screen: Not on file  Housing: High Risk (04/05/2024)   Epic    Unable to Pay for Housing in the Last Year: Yes    Number of Times Moved in the Last Year: 0    Homeless in the Last Year: No  Utilities: Not At Risk (05/25/2022)   [facility] Utilities    Threatened with loss of utilities: No  Health Literacy: Not on file   Past Surgical History:   Procedure Laterality Date   BRONCHIAL WASHINGS  08/16/2023   Procedure: IRRIGATION, BRONCHUS;  Surgeon: Shelah Lamar RAMAN, MD;  Location: [facility] ENDOSCOPY;  Service: Pulmonary;;   CARDIAC CATHETERIZATION  03/25/2018   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, mLAD, DIAG, pCX, pRCA)   CARDIOVERSION N/A 09/22/2023   Procedure: CARDIOVERSION;  Surgeon: Gardenia Led, DO;  Location: [facility] INVASIVE CV LAB;  Service: Cardiovascular;  Laterality: N/A;   CARPAL TUNNEL RELEASE Left    FINE NEEDLE ASPIRATION  08/16/2023   Procedure: FINE NEEDLE ASPIRATION (FNA) LINEAR;  Surgeon: Shelah Lamar RAMAN, MD;  Location: [facility] ENDOSCOPY;  Service: Pulmonary;;   ICD IMPLANT N/A 05/05/2024   Procedure: ICD IMPLANT;  Surgeon: Waddell Danelle ORN, MD;  Location: [facility] INVASIVE CV LAB;  Service: Cardiovascular;  Laterality:  N/A;   LEAD EXTRACTION N/A 05/05/2024   Procedure: LEAD EXTRACTION;  Surgeon: Waddell Danelle ORN, MD;  [facility] INVASIVE CV LAB;  Service: Cardiovascular;  Laterality: N/A;   PACEMAKER IMPLANT N/A 08/17/2022   Procedure: PACEMAKER IMPLANT;  Surgeon: Waddell Danelle ORN, MD;  Location: [facility] INVASIVE CV LAB;  Service: Cardiovascular;  Laterality: N/A;   PPM GENERATOR REMOVAL N/A 05/05/2024   Procedure: PPM GENERATOR REMOVAL;  Surgeon: Waddell Danelle ORN, MD;  Location: [facility] INVAS[facility] CV LAB;  Service: Cardiovascular;  Laterality: N/A;   TRANSESOPHAGEAL ECHOCARDIOGRAM (CATH LAB) N/A 09/22/2023   Procedure: TRANSESOPHAGEAL ECHOCARDIOGRAM;  Surgeon: Gardenia Led, DO;  Location: [facility] INVASIVE CV LAB;  Service: Cardiovascular;  Laterality: N/A;   VIDEO BRONCHOSCOPY WITH ENDOBRONCHIAL ULTRASOUND N/A 08/16/2023   Procedure: BRONCHOSCOPY, WITH EBUS;  Surgeon: Shelah Lamar RAMAN, MD;  Location: [facility] ENDOSCOPY;  Service: Pulmonary;  Laterality: N/A;   Past Medical History:  Diagnosis Date   Alcohol abuse    Arthritis    Atrial flutter (HCC)    Cardiac sarcoidosis    Chronic pain    Diabetes mellitus without complication (HCC)    DVT (deep venous thrombosis)  (HCC) 05/26/2022   LLE   Dysrhythmia    2nd degree AV Block, 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 on auto CPAP   PE (pulmonary thromboembolism) (HCC) 05/25/2022   Peripheral polyneuropathy    Presence of permanent cardiac pacemaker    Tobacco abuse    Venous insufficiency    There were no vitals taken for this visit.  Opioid Risk Score:   Fall Risk Score:  `1  Depression screen [facility] 2/9     06/20/2024    9:53 AM 04/05/2024    8:09 AM 03/30/2024    9:56 AM 12/23/2023    9:24 AM 08/10/2023    9:44 AM 05/24/2023    2:34 PM 05/07/2023    9:26 AM  Depression screen PHQ 2/9  Decreased Interest 2 2 1 2 2  0 1  Down, Depressed, Hopeless 2 2 1 2 2  0 1  PHQ - 2 Score 4 4 2 4 4  0 2  Altered sleeping  3 3 2 2  0 3  Tired, decreased energy  2 3 2 2  0 3  Change in appetite  0 3 2 2  0 3  Feeling bad or failure about yourself   2 2 2 3  0 2  Trouble concentrating  2 1 2 2  0 2  Moving slowly or fidgety/restless  1 0 2 1 0 2  Suicidal thoughts 0 0 0 2 0 0 0  PHQ-9 Score  14  14  18  16   0  17   Difficult doing work/chores     Very difficult       Data saved with a previous flowsheet row definition    Review of Systems  Musculoskeletal:  Positive for back pain and neck pain.       Pain both feet, Left ankle pain, left shoulder pain  All other systems reviewed and are negative.      Objective:   Physical Exam   Gen: no distress, normal appearing HEENT: oral mucosa pink and moist, NCAT Chest: normal effort, normal rate of  breathing Abd: soft, non-distended Ext: no edema Psych: pleasant, normal affect Skin: intact, no open wounds noted Neuro: Alert and awake, follows commands, cranial nerves II through XII grossly intact, normal speech and language No  focal motor deficits noted Sensation altered in his bilateral feet and ankles Musculoskeletal: Minimal foot ankle tenderness/joint swelling         Assessment & Plan:   ASSESSMENT 1.  Peripheral Neuropathy: Chronic, severe burning pain in bilateral feet, worse at night, with associated numbness. 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 A1C is controlled on metformin . 2.  Chronic Back and Neck Pain: History of spinal stenosis and fusion. Managed with oxycodone  by PCP. 3.  Refractory Pain: Has failed or had inadequate response to multiple neuropathic agents including pregabalin  (at current prescribed dose), gabapentin , and duloxetine .  PLAN 1.  4 patch of Qutenza  was applied to the area of pain plantar and dorsal feet. Ice packs were applied during the procedure to ensure patient comfort. Blood pressure was monitored every 15 minutes.   Post-procedure instructions were given and follow-up has been scheduled.   Patient had some burning sensation on his feet  last few minutes of the procedure.  It improved after removing the patches and cleaning the feet.  Advised to try cold water for short period of time if needed, lidocaine  cream given if needed.  2.  Pregabalin  (Lyrica ): Was adjusted to 200 mg twice daily last visit.  Continue current  3.  Amitriptyline : Low-dose amitriptyline  10 mg started last visit -unclear if there is benefit   4.  Oxycodone : Will continue to be managed by his primary care physician at this time. No changes made to this medication.  5.  Spinal Cord Stimulator: Patient reports he has been working with a different provider and getting this device.   "

## 2024-07-06 ENCOUNTER — Ambulatory Visit: Admitting: Family Medicine

## 2024-07-12 ENCOUNTER — Other Ambulatory Visit (HOSPITAL_COMMUNITY): Payer: Self-pay

## 2024-07-14 ENCOUNTER — Other Ambulatory Visit: Payer: Self-pay | Admitting: Family Medicine

## 2024-08-07 ENCOUNTER — Ambulatory Visit: Admitting: Student

## 2024-08-31 ENCOUNTER — Ambulatory Visit (HOSPITAL_COMMUNITY)

## 2024-09-15 ENCOUNTER — Ambulatory Visit

## 2024-09-28 ENCOUNTER — Encounter: Admitting: Physical Medicine & Rehabilitation

## 2024-10-04 ENCOUNTER — Ambulatory Visit: Admitting: Family Medicine
